# Patient Record
Sex: Male | Born: 1950 | Race: White | Hispanic: No | Marital: Married | Smoking: Former smoker
Health system: Southern US, Community
[De-identification: ages and names within clinical notes are randomized; demographics above are authoritative.]

## PROBLEM LIST (undated history)

## (undated) DIAGNOSIS — Z8489 Family history of other specified conditions: Secondary | ICD-10-CM

## (undated) DIAGNOSIS — M1990 Unspecified osteoarthritis, unspecified site: Secondary | ICD-10-CM

## (undated) HISTORY — PX: COLONOSCOPY: SHX174

---

## 1992-01-20 HISTORY — PX: KNEE CARTILAGE SURGERY: SHX688

## 1992-01-20 HISTORY — PX: ELBOW SURGERY: SHX618

## 1996-01-20 HISTORY — PX: KNEE ARTHROSCOPY: SUR90

## 2015-06-07 DIAGNOSIS — E119 Type 2 diabetes mellitus without complications: Secondary | ICD-10-CM | POA: Diagnosis not present

## 2015-06-21 DIAGNOSIS — E119 Type 2 diabetes mellitus without complications: Secondary | ICD-10-CM | POA: Diagnosis not present

## 2015-07-06 DIAGNOSIS — E119 Type 2 diabetes mellitus without complications: Secondary | ICD-10-CM | POA: Diagnosis not present

## 2015-07-06 DIAGNOSIS — Z711 Person with feared health complaint in whom no diagnosis is made: Secondary | ICD-10-CM | POA: Diagnosis not present

## 2015-07-06 DIAGNOSIS — Z794 Long term (current) use of insulin: Secondary | ICD-10-CM | POA: Diagnosis not present

## 2015-07-06 DIAGNOSIS — R69 Illness, unspecified: Secondary | ICD-10-CM | POA: Diagnosis not present

## 2015-07-31 DIAGNOSIS — E119 Type 2 diabetes mellitus without complications: Secondary | ICD-10-CM | POA: Diagnosis not present

## 2015-08-29 DIAGNOSIS — M25562 Pain in left knee: Secondary | ICD-10-CM | POA: Diagnosis not present

## 2015-08-29 DIAGNOSIS — M25561 Pain in right knee: Secondary | ICD-10-CM | POA: Diagnosis not present

## 2015-10-07 DIAGNOSIS — E785 Hyperlipidemia, unspecified: Secondary | ICD-10-CM | POA: Diagnosis not present

## 2015-10-07 DIAGNOSIS — M25562 Pain in left knee: Secondary | ICD-10-CM | POA: Diagnosis not present

## 2015-10-07 DIAGNOSIS — Z23 Encounter for immunization: Secondary | ICD-10-CM | POA: Diagnosis not present

## 2015-10-07 DIAGNOSIS — E119 Type 2 diabetes mellitus without complications: Secondary | ICD-10-CM | POA: Diagnosis not present

## 2015-11-06 DIAGNOSIS — E119 Type 2 diabetes mellitus without complications: Secondary | ICD-10-CM | POA: Diagnosis not present

## 2015-12-06 DIAGNOSIS — E119 Type 2 diabetes mellitus without complications: Secondary | ICD-10-CM | POA: Diagnosis not present

## 2015-12-06 DIAGNOSIS — I10 Essential (primary) hypertension: Secondary | ICD-10-CM | POA: Diagnosis not present

## 2015-12-06 DIAGNOSIS — L820 Inflamed seborrheic keratosis: Secondary | ICD-10-CM | POA: Diagnosis not present

## 2015-12-06 DIAGNOSIS — E1169 Type 2 diabetes mellitus with other specified complication: Secondary | ICD-10-CM | POA: Diagnosis not present

## 2015-12-17 DIAGNOSIS — I10 Essential (primary) hypertension: Secondary | ICD-10-CM | POA: Diagnosis not present

## 2015-12-17 DIAGNOSIS — E119 Type 2 diabetes mellitus without complications: Secondary | ICD-10-CM | POA: Diagnosis not present

## 2015-12-17 DIAGNOSIS — L820 Inflamed seborrheic keratosis: Secondary | ICD-10-CM | POA: Diagnosis not present

## 2015-12-17 DIAGNOSIS — E1169 Type 2 diabetes mellitus with other specified complication: Secondary | ICD-10-CM | POA: Diagnosis not present

## 2016-01-06 DIAGNOSIS — E119 Type 2 diabetes mellitus without complications: Secondary | ICD-10-CM | POA: Diagnosis not present

## 2016-01-16 DIAGNOSIS — I10 Essential (primary) hypertension: Secondary | ICD-10-CM | POA: Diagnosis not present

## 2016-01-16 DIAGNOSIS — M170 Bilateral primary osteoarthritis of knee: Secondary | ICD-10-CM | POA: Diagnosis not present

## 2016-01-16 DIAGNOSIS — Z7984 Long term (current) use of oral hypoglycemic drugs: Secondary | ICD-10-CM | POA: Diagnosis not present

## 2016-01-16 DIAGNOSIS — Z841 Family history of disorders of kidney and ureter: Secondary | ICD-10-CM | POA: Diagnosis not present

## 2016-01-16 DIAGNOSIS — Z833 Family history of diabetes mellitus: Secondary | ICD-10-CM | POA: Diagnosis not present

## 2016-01-16 DIAGNOSIS — Z8249 Family history of ischemic heart disease and other diseases of the circulatory system: Secondary | ICD-10-CM | POA: Diagnosis not present

## 2016-01-16 DIAGNOSIS — Z791 Long term (current) use of non-steroidal anti-inflammatories (NSAID): Secondary | ICD-10-CM | POA: Diagnosis not present

## 2016-01-16 DIAGNOSIS — R69 Illness, unspecified: Secondary | ICD-10-CM | POA: Diagnosis not present

## 2016-01-16 DIAGNOSIS — Z6828 Body mass index (BMI) 28.0-28.9, adult: Secondary | ICD-10-CM | POA: Diagnosis not present

## 2016-01-16 DIAGNOSIS — E119 Type 2 diabetes mellitus without complications: Secondary | ICD-10-CM | POA: Diagnosis not present

## 2016-02-04 DIAGNOSIS — E119 Type 2 diabetes mellitus without complications: Secondary | ICD-10-CM | POA: Diagnosis not present

## 2016-03-11 DIAGNOSIS — M25569 Pain in unspecified knee: Secondary | ICD-10-CM | POA: Diagnosis not present

## 2016-03-11 DIAGNOSIS — E119 Type 2 diabetes mellitus without complications: Secondary | ICD-10-CM | POA: Diagnosis not present

## 2016-03-11 DIAGNOSIS — I10 Essential (primary) hypertension: Secondary | ICD-10-CM | POA: Diagnosis not present

## 2016-03-11 DIAGNOSIS — R69 Illness, unspecified: Secondary | ICD-10-CM | POA: Diagnosis not present

## 2016-04-06 DIAGNOSIS — E119 Type 2 diabetes mellitus without complications: Secondary | ICD-10-CM | POA: Diagnosis not present

## 2016-04-06 DIAGNOSIS — R69 Illness, unspecified: Secondary | ICD-10-CM | POA: Diagnosis not present

## 2016-04-06 DIAGNOSIS — G4700 Insomnia, unspecified: Secondary | ICD-10-CM | POA: Diagnosis not present

## 2016-04-16 DIAGNOSIS — R69 Illness, unspecified: Secondary | ICD-10-CM | POA: Diagnosis not present

## 2016-04-16 DIAGNOSIS — M170 Bilateral primary osteoarthritis of knee: Secondary | ICD-10-CM | POA: Diagnosis not present

## 2016-04-16 DIAGNOSIS — I10 Essential (primary) hypertension: Secondary | ICD-10-CM | POA: Diagnosis not present

## 2016-04-16 DIAGNOSIS — E1142 Type 2 diabetes mellitus with diabetic polyneuropathy: Secondary | ICD-10-CM | POA: Diagnosis not present

## 2016-04-16 DIAGNOSIS — E1122 Type 2 diabetes mellitus with diabetic chronic kidney disease: Secondary | ICD-10-CM | POA: Diagnosis not present

## 2016-04-16 DIAGNOSIS — E1165 Type 2 diabetes mellitus with hyperglycemia: Secondary | ICD-10-CM | POA: Diagnosis not present

## 2016-04-16 DIAGNOSIS — N183 Chronic kidney disease, stage 3 (moderate): Secondary | ICD-10-CM | POA: Diagnosis not present

## 2016-05-13 DIAGNOSIS — E1122 Type 2 diabetes mellitus with diabetic chronic kidney disease: Secondary | ICD-10-CM | POA: Diagnosis not present

## 2016-05-13 DIAGNOSIS — Z6827 Body mass index (BMI) 27.0-27.9, adult: Secondary | ICD-10-CM | POA: Diagnosis not present

## 2016-05-13 DIAGNOSIS — E782 Mixed hyperlipidemia: Secondary | ICD-10-CM | POA: Diagnosis not present

## 2016-05-13 DIAGNOSIS — I10 Essential (primary) hypertension: Secondary | ICD-10-CM | POA: Diagnosis not present

## 2016-05-13 DIAGNOSIS — M25569 Pain in unspecified knee: Secondary | ICD-10-CM | POA: Diagnosis not present

## 2016-05-13 DIAGNOSIS — E1142 Type 2 diabetes mellitus with diabetic polyneuropathy: Secondary | ICD-10-CM | POA: Diagnosis not present

## 2016-08-17 DIAGNOSIS — N183 Chronic kidney disease, stage 3 (moderate): Secondary | ICD-10-CM | POA: Diagnosis not present

## 2016-08-17 DIAGNOSIS — R5383 Other fatigue: Secondary | ICD-10-CM | POA: Diagnosis not present

## 2016-08-17 DIAGNOSIS — I10 Essential (primary) hypertension: Secondary | ICD-10-CM | POA: Diagnosis not present

## 2016-08-17 DIAGNOSIS — E1122 Type 2 diabetes mellitus with diabetic chronic kidney disease: Secondary | ICD-10-CM | POA: Diagnosis not present

## 2016-08-17 DIAGNOSIS — E1142 Type 2 diabetes mellitus with diabetic polyneuropathy: Secondary | ICD-10-CM | POA: Diagnosis not present

## 2016-08-17 DIAGNOSIS — R69 Illness, unspecified: Secondary | ICD-10-CM | POA: Diagnosis not present

## 2016-08-17 DIAGNOSIS — E782 Mixed hyperlipidemia: Secondary | ICD-10-CM | POA: Diagnosis not present

## 2016-08-17 DIAGNOSIS — M170 Bilateral primary osteoarthritis of knee: Secondary | ICD-10-CM | POA: Diagnosis not present

## 2016-09-03 DIAGNOSIS — G8929 Other chronic pain: Secondary | ICD-10-CM | POA: Insufficient documentation

## 2016-09-03 DIAGNOSIS — M21162 Varus deformity, not elsewhere classified, left knee: Secondary | ICD-10-CM | POA: Diagnosis not present

## 2016-09-03 DIAGNOSIS — M170 Bilateral primary osteoarthritis of knee: Secondary | ICD-10-CM | POA: Diagnosis not present

## 2016-09-03 DIAGNOSIS — M21161 Varus deformity, not elsewhere classified, right knee: Secondary | ICD-10-CM | POA: Diagnosis not present

## 2016-09-03 DIAGNOSIS — M160 Bilateral primary osteoarthritis of hip: Secondary | ICD-10-CM | POA: Diagnosis not present

## 2016-11-20 DIAGNOSIS — R69 Illness, unspecified: Secondary | ICD-10-CM | POA: Diagnosis not present

## 2016-11-20 DIAGNOSIS — E1142 Type 2 diabetes mellitus with diabetic polyneuropathy: Secondary | ICD-10-CM | POA: Diagnosis not present

## 2016-11-20 DIAGNOSIS — M170 Bilateral primary osteoarthritis of knee: Secondary | ICD-10-CM | POA: Diagnosis not present

## 2016-11-20 DIAGNOSIS — E1122 Type 2 diabetes mellitus with diabetic chronic kidney disease: Secondary | ICD-10-CM | POA: Diagnosis not present

## 2016-11-20 DIAGNOSIS — I10 Essential (primary) hypertension: Secondary | ICD-10-CM | POA: Diagnosis not present

## 2016-11-20 DIAGNOSIS — E782 Mixed hyperlipidemia: Secondary | ICD-10-CM | POA: Diagnosis not present

## 2016-11-20 DIAGNOSIS — N183 Chronic kidney disease, stage 3 (moderate): Secondary | ICD-10-CM | POA: Diagnosis not present

## 2017-03-22 DIAGNOSIS — M1711 Unilateral primary osteoarthritis, right knee: Secondary | ICD-10-CM | POA: Diagnosis not present

## 2017-03-22 DIAGNOSIS — N183 Chronic kidney disease, stage 3 (moderate): Secondary | ICD-10-CM | POA: Diagnosis not present

## 2017-03-22 DIAGNOSIS — M170 Bilateral primary osteoarthritis of knee: Secondary | ICD-10-CM | POA: Diagnosis not present

## 2017-03-22 DIAGNOSIS — E1142 Type 2 diabetes mellitus with diabetic polyneuropathy: Secondary | ICD-10-CM | POA: Diagnosis not present

## 2017-03-22 DIAGNOSIS — E1122 Type 2 diabetes mellitus with diabetic chronic kidney disease: Secondary | ICD-10-CM | POA: Diagnosis not present

## 2017-03-22 DIAGNOSIS — E782 Mixed hyperlipidemia: Secondary | ICD-10-CM | POA: Diagnosis not present

## 2017-03-22 DIAGNOSIS — I10 Essential (primary) hypertension: Secondary | ICD-10-CM | POA: Diagnosis not present

## 2017-03-22 DIAGNOSIS — R69 Illness, unspecified: Secondary | ICD-10-CM | POA: Diagnosis not present

## 2017-03-31 DIAGNOSIS — M170 Bilateral primary osteoarthritis of knee: Secondary | ICD-10-CM | POA: Diagnosis not present

## 2017-07-21 DIAGNOSIS — E1142 Type 2 diabetes mellitus with diabetic polyneuropathy: Secondary | ICD-10-CM | POA: Diagnosis not present

## 2017-07-21 DIAGNOSIS — E1122 Type 2 diabetes mellitus with diabetic chronic kidney disease: Secondary | ICD-10-CM | POA: Diagnosis not present

## 2017-07-21 DIAGNOSIS — R69 Illness, unspecified: Secondary | ICD-10-CM | POA: Diagnosis not present

## 2017-07-21 DIAGNOSIS — E782 Mixed hyperlipidemia: Secondary | ICD-10-CM | POA: Diagnosis not present

## 2017-07-21 DIAGNOSIS — N183 Chronic kidney disease, stage 3 (moderate): Secondary | ICD-10-CM | POA: Diagnosis not present

## 2017-07-21 DIAGNOSIS — I10 Essential (primary) hypertension: Secondary | ICD-10-CM | POA: Diagnosis not present

## 2017-07-21 DIAGNOSIS — M170 Bilateral primary osteoarthritis of knee: Secondary | ICD-10-CM | POA: Diagnosis not present

## 2017-08-04 DIAGNOSIS — I10 Essential (primary) hypertension: Secondary | ICD-10-CM | POA: Diagnosis not present

## 2017-11-12 DIAGNOSIS — E119 Type 2 diabetes mellitus without complications: Secondary | ICD-10-CM | POA: Diagnosis not present

## 2017-11-12 LAB — HM DIABETES EYE EXAM

## 2018-05-02 DIAGNOSIS — E1122 Type 2 diabetes mellitus with diabetic chronic kidney disease: Secondary | ICD-10-CM | POA: Diagnosis not present

## 2018-05-02 DIAGNOSIS — E782 Mixed hyperlipidemia: Secondary | ICD-10-CM | POA: Diagnosis not present

## 2018-05-02 DIAGNOSIS — I10 Essential (primary) hypertension: Secondary | ICD-10-CM | POA: Diagnosis not present

## 2018-05-02 DIAGNOSIS — Z6828 Body mass index (BMI) 28.0-28.9, adult: Secondary | ICD-10-CM | POA: Diagnosis not present

## 2018-05-02 DIAGNOSIS — R69 Illness, unspecified: Secondary | ICD-10-CM | POA: Diagnosis not present

## 2018-07-13 DIAGNOSIS — Z1159 Encounter for screening for other viral diseases: Secondary | ICD-10-CM | POA: Diagnosis not present

## 2018-07-13 DIAGNOSIS — Z0000 Encounter for general adult medical examination without abnormal findings: Secondary | ICD-10-CM | POA: Diagnosis not present

## 2018-09-01 DIAGNOSIS — E1142 Type 2 diabetes mellitus with diabetic polyneuropathy: Secondary | ICD-10-CM | POA: Diagnosis not present

## 2018-09-01 DIAGNOSIS — N183 Chronic kidney disease, stage 3 (moderate): Secondary | ICD-10-CM | POA: Diagnosis not present

## 2018-09-01 DIAGNOSIS — I10 Essential (primary) hypertension: Secondary | ICD-10-CM | POA: Diagnosis not present

## 2018-09-01 DIAGNOSIS — Z6828 Body mass index (BMI) 28.0-28.9, adult: Secondary | ICD-10-CM | POA: Diagnosis not present

## 2018-09-01 DIAGNOSIS — E782 Mixed hyperlipidemia: Secondary | ICD-10-CM | POA: Diagnosis not present

## 2018-10-18 DIAGNOSIS — M75101 Unspecified rotator cuff tear or rupture of right shoulder, not specified as traumatic: Secondary | ICD-10-CM | POA: Diagnosis not present

## 2018-10-18 DIAGNOSIS — M170 Bilateral primary osteoarthritis of knee: Secondary | ICD-10-CM | POA: Diagnosis not present

## 2018-10-18 DIAGNOSIS — M75102 Unspecified rotator cuff tear or rupture of left shoulder, not specified as traumatic: Secondary | ICD-10-CM | POA: Diagnosis not present

## 2018-10-18 DIAGNOSIS — E1142 Type 2 diabetes mellitus with diabetic polyneuropathy: Secondary | ICD-10-CM | POA: Diagnosis not present

## 2018-10-26 DIAGNOSIS — M1712 Unilateral primary osteoarthritis, left knee: Secondary | ICD-10-CM | POA: Diagnosis not present

## 2018-10-26 DIAGNOSIS — Z1159 Encounter for screening for other viral diseases: Secondary | ICD-10-CM | POA: Diagnosis not present

## 2019-01-11 DIAGNOSIS — E782 Mixed hyperlipidemia: Secondary | ICD-10-CM | POA: Diagnosis not present

## 2019-01-11 DIAGNOSIS — E1142 Type 2 diabetes mellitus with diabetic polyneuropathy: Secondary | ICD-10-CM | POA: Diagnosis not present

## 2019-01-11 DIAGNOSIS — E1122 Type 2 diabetes mellitus with diabetic chronic kidney disease: Secondary | ICD-10-CM | POA: Diagnosis not present

## 2019-01-11 DIAGNOSIS — Z1159 Encounter for screening for other viral diseases: Secondary | ICD-10-CM | POA: Diagnosis not present

## 2019-01-11 DIAGNOSIS — I10 Essential (primary) hypertension: Secondary | ICD-10-CM | POA: Diagnosis not present

## 2019-01-11 DIAGNOSIS — Z6828 Body mass index (BMI) 28.0-28.9, adult: Secondary | ICD-10-CM | POA: Diagnosis not present

## 2019-01-11 DIAGNOSIS — N1831 Chronic kidney disease, stage 3a: Secondary | ICD-10-CM | POA: Diagnosis not present

## 2019-01-11 DIAGNOSIS — R69 Illness, unspecified: Secondary | ICD-10-CM | POA: Diagnosis not present

## 2019-01-16 DIAGNOSIS — E782 Mixed hyperlipidemia: Secondary | ICD-10-CM | POA: Diagnosis not present

## 2019-01-16 DIAGNOSIS — I10 Essential (primary) hypertension: Secondary | ICD-10-CM | POA: Diagnosis not present

## 2019-01-16 DIAGNOSIS — Z23 Encounter for immunization: Secondary | ICD-10-CM | POA: Diagnosis not present

## 2019-01-16 DIAGNOSIS — E1122 Type 2 diabetes mellitus with diabetic chronic kidney disease: Secondary | ICD-10-CM | POA: Diagnosis not present

## 2019-01-20 DIAGNOSIS — R69 Illness, unspecified: Secondary | ICD-10-CM | POA: Diagnosis not present

## 2019-04-12 ENCOUNTER — Other Ambulatory Visit: Payer: Self-pay | Admitting: Legal Medicine

## 2019-04-26 ENCOUNTER — Other Ambulatory Visit: Payer: Self-pay | Admitting: Legal Medicine

## 2019-04-27 ENCOUNTER — Other Ambulatory Visit: Payer: Self-pay

## 2019-04-27 ENCOUNTER — Ambulatory Visit (INDEPENDENT_AMBULATORY_CARE_PROVIDER_SITE_OTHER): Payer: Medicare HMO | Admitting: Legal Medicine

## 2019-04-27 ENCOUNTER — Other Ambulatory Visit: Payer: Self-pay | Admitting: Legal Medicine

## 2019-04-27 ENCOUNTER — Encounter: Payer: Self-pay | Admitting: Legal Medicine

## 2019-04-27 VITALS — BP 160/90 | HR 86 | Temp 96.8°F | Resp 17 | Ht 70.0 in | Wt 197.0 lb

## 2019-04-27 DIAGNOSIS — E1142 Type 2 diabetes mellitus with diabetic polyneuropathy: Secondary | ICD-10-CM

## 2019-04-27 DIAGNOSIS — E782 Mixed hyperlipidemia: Secondary | ICD-10-CM

## 2019-04-27 DIAGNOSIS — N183 Chronic kidney disease, stage 3 unspecified: Secondary | ICD-10-CM

## 2019-04-27 DIAGNOSIS — E1122 Type 2 diabetes mellitus with diabetic chronic kidney disease: Secondary | ICD-10-CM | POA: Insufficient documentation

## 2019-04-27 DIAGNOSIS — M1711 Unilateral primary osteoarthritis, right knee: Secondary | ICD-10-CM | POA: Diagnosis not present

## 2019-04-27 DIAGNOSIS — N1831 Chronic kidney disease, stage 3a: Secondary | ICD-10-CM | POA: Insufficient documentation

## 2019-04-27 DIAGNOSIS — I10 Essential (primary) hypertension: Secondary | ICD-10-CM

## 2019-04-27 HISTORY — DX: Essential (primary) hypertension: I10

## 2019-04-27 HISTORY — DX: Chronic kidney disease, stage 3 unspecified: N18.30

## 2019-04-27 HISTORY — DX: Type 2 diabetes mellitus with diabetic polyneuropathy: E11.42

## 2019-04-27 HISTORY — DX: Chronic kidney disease, stage 3 unspecified: E11.22

## 2019-04-27 HISTORY — DX: Mixed hyperlipidemia: E78.2

## 2019-04-27 HISTORY — DX: Chronic kidney disease, stage 3a: N18.31

## 2019-04-27 MED ORDER — TRIAMCINOLONE ACETONIDE 40 MG/ML IJ SUSP
80.0000 mg | Freq: Once | INTRAMUSCULAR | Status: AC
  Administered 2019-04-27: 80 mg via INTRA_ARTICULAR

## 2019-04-27 NOTE — Progress Notes (Signed)
Acute Office Visit  Subjective:    Patient ID: Clayton James, male    DOB: 1950-02-24, 69 y.o.   MRN: [id number]  Chief Complaint  Patient presents with  . Osteoarthritis    Right knee pain since years ago    HPI Patient is in today for right knee osteoarthritis.  Severe pain  2 weeks ago.  He had injections in the past that helped.  He is reluctant to get TKA but we discussed this is his future.    No falls.  Past Medical History:  Diagnosis Date  . Benign essential hypertension 04/27/2019  . Chronic kidney disease, stage 3a 04/27/2019  . Mixed hyperlipidemia 04/27/2019  . Type 2 diabetes mellitus with diabetic polyneuropathy ([place]) 04/27/2019  . Type 2 diabetes mellitus with stage 3 chronic kidney disease ([place]) 04/27/2019    History reviewed. No pertinent surgical history.  History reviewed. No pertinent family history.  Social History   Socioeconomic History  . Marital status: Married    Spouse name: Not on file  . Number of children: Not on file  . Years of education: Not on file  . Highest education level: Not on file  Occupational History  . Not on file  Tobacco Use  . Smoking status: Former Smoker    Years: 8.00    Types: Cigarettes    Quit date: 2000    Years since quitting: 21.2  . Smokeless tobacco: Current User    Types: Chew  Substance and Sexual Activity  . Alcohol use: Not Currently  . Drug use: Never  . Sexual activity: Yes  Other Topics Concern  . Not on file  Social History Narrative  . Not on file   Social Determinants of Health   Financial Resource Strain:   . Difficulty of Paying Living Expenses:   Food Insecurity:   . Worried About Charity fundraiser in the Last Year:   . Arboriculturist in the Last Year:   Transportation Needs:   . Film/video editor (Medical):   Marland Kitchen Lack of Transportation (Non-Medical):   Physical Activity:   . Days of Exercise per Week:   . Minutes of Exercise per Session:   Stress:   . Feeling of Stress :     Social Connections:   . Frequency of Communication with Friends and Family:   . Frequency of Social Gatherings with Friends and Family:   . Attends Religious Services:   . Active Member of Clubs or Organizations:   . Attends Archivist Meetings:   Marland Kitchen Marital Status:   Intimate Partner Violence:   . Fear of Current or Ex-Partner:   . Emotionally Abused:   Marland Kitchen Physically Abused:   . Sexually Abused:     Outpatient Medications Prior to Visit  Medication Sig Dispense Refill  . ALPRAZolam (XANAX) 0.5 MG tablet Take 1 tablet by mouth twice daily as needed 60 tablet 2  . amLODipine-benazepril (LOTREL) 10-20 MG capsule Take 1 capsule by mouth once daily 90 capsule 2  . atorvastatin (LIPITOR) 40 MG tablet Take by mouth.    . BD PEN NEEDLE MICRO U/F 32G X 6 MM MISC daily. use as directed    . Blood Glucose Monitoring Suppl (ONE TOUCH ULTRA 2) w/Device KIT USE TO CHECK GLUCOSE THREE TIMES DAILY    . dapagliflozin propanediol (FARXIGA) 10 MG TABS tablet Take by mouth.    . Diclofenac Sodium CR 100 MG 24 hr tablet Take 100 mg by mouth  daily.    . insulin degludec (TRESIBA) 100 UNIT/ML FlexTouch Pen Inject into the skin.    . metFORMIN (GLUCOPHAGE) 1000 MG tablet Take 1 tablet by mouth twice daily 180 tablet 2   No facility-administered medications prior to visit.    Allergies  Allergen Reactions  . Sulfamethoxazole Rash    Review of Systems  Constitutional: Negative.   HENT: Negative.   Eyes: Negative.   Respiratory: Negative.   Cardiovascular: Negative.   Gastrointestinal: Negative.   Genitourinary: Negative.   Musculoskeletal: Positive for arthralgias and joint swelling.  Psychiatric/Behavioral: Negative.        Objective:    Physical Exam Vitals reviewed.  Constitutional:      Appearance: Normal appearance.  Cardiovascular:     Rate and Rhythm: Normal rate and regular rhythm.     Pulses: Normal pulses.     Heart sounds: Normal heart sounds.  Pulmonary:      Effort: Pulmonary effort is normal.     Breath sounds: Normal breath sounds.  Musculoskeletal:     Cervical back: Normal range of motion and neck supple.     Right knee: Swelling, bony tenderness and crepitus present.     Left knee: Swelling, bony tenderness and crepitus present.       Legs:     Comments: Pain right knee.  ROM fu.  Positive crepitation.  Negative McMurray and negative Lachman  Neurological:     General: No focal deficit present.     Mental Status: He is alert and oriented to person, place, and time.     BP (!) 160/90 (BP Location: Left Arm, Patient Position: Sitting)   Pulse 86   Temp (!) 96.8 F (36 C) (Temporal)   Resp 17   Ht 5' 10"  (1.778 m)   Wt 197 lb (89.4 kg)   BMI 28.27 kg/m  Wt Readings from Last 3 Encounters:  04/27/19 197 lb (89.4 kg)    Health Maintenance Due  Topic Date Due  . HEMOGLOBIN A1C  Never done  . Hepatitis C Screening  Never done  . FOOT EXAM  Never done  . OPHTHALMOLOGY EXAM  Never done  . TETANUS/TDAP  Never done  . COLONOSCOPY  Never done  . PNA vac Low Risk Adult (1 of 2 - PCV13) Never done    There are no preventive care reminders to display for this patient.   No results found for: TSH No results found for: WBC, HGB, HCT, MCV, PLT No results found for: NA, K, CHLORIDE, CO2, GLUCOSE, BUN, CREATININE, BILITOT, ALKPHOS, AST, ALT, PROT, ALBUMIN, CALCIUM, ANIONGAP, EGFR, GFR No results found for: CHOL No results found for: HDL No results found for: LDLCALC No results found for: TRIG No results found for: CHOLHDL No results found for: HGBA1C     Assessment & Plan:   Problem List Items Addressed This Visit      Musculoskeletal and Integument   Osteoarthritis of right knee    He is having severe pain in right knee.  He is considering getting TKA but wishes injection for now.      Relevant Medications   Diclofenac Sodium CR 100 MG 24 hr tablet    Other Visit Diagnoses    Unilateral primary osteoarthritis, right  knee    -  Primary   Relevant Medications   Diclofenac Sodium CR 100 MG 24 hr tablet   triamcinolone acetonide (KENALOG-40) injection 80 mg (Completed)     Return in about 1 week (around 05/04/2019)  for for left knee.  Meds ordered this encounter  Medications  . triamcinolone acetonide (KENALOG-40) injection 80 mg     Reinaldo Meeker, MD

## 2019-04-27 NOTE — Assessment & Plan Note (Signed)
He is having severe pain in right knee.  He is considering getting TKA but wishes injection for now.

## 2019-05-04 ENCOUNTER — Encounter: Payer: Self-pay | Admitting: Legal Medicine

## 2019-05-04 ENCOUNTER — Other Ambulatory Visit: Payer: Self-pay

## 2019-05-04 ENCOUNTER — Ambulatory Visit (INDEPENDENT_AMBULATORY_CARE_PROVIDER_SITE_OTHER): Payer: Medicare HMO | Admitting: Legal Medicine

## 2019-05-04 DIAGNOSIS — M1712 Unilateral primary osteoarthritis, left knee: Secondary | ICD-10-CM | POA: Insufficient documentation

## 2019-05-04 DIAGNOSIS — I10 Essential (primary) hypertension: Secondary | ICD-10-CM

## 2019-05-04 MED ORDER — TRIAMCINOLONE ACETONIDE 40 MG/ML IJ SUSP: 80.0000 mg | Freq: Once | INTRAMUSCULAR | Status: DC

## 2019-05-04 MED ORDER — BD PEN NEEDLE MICRO U/F 32G X 6 MM MISC: 1.0000 | Freq: Every day | 4 refills | Status: DC

## 2019-05-04 NOTE — Assessment & Plan Note (Signed)
Left knee injected for osteoarthritis see procedure.

## 2019-05-04 NOTE — Assessment & Plan Note (Signed)
Check BP at home for one week and call us results, if remains elevated, then we discussed BP medicine changes.  Today he is stressed and in a hurry.

## 2019-05-04 NOTE — Progress Notes (Signed)
Established Patient Office Visit  Subjective:  Patient ID: Clayton James, male    DOB: 14-Mar-1950  Age: 69 y.o. MRN: [id number]  CC:  Chief Complaint  Patient presents with  . Knee Pain    Left knee    HPI Clayton James presents for injection left knee due to primary osteoarthritis.  Past Medical History:  Diagnosis Date  . Benign essential hypertension 04/27/2019  . Chronic kidney disease, stage 3a 04/27/2019  . Mixed hyperlipidemia 04/27/2019  . Type 2 diabetes mellitus with diabetic polyneuropathy ([place]) 04/27/2019  . Type 2 diabetes mellitus with stage 3 chronic kidney disease ([place]) 04/27/2019    History reviewed. No pertinent surgical history.  History reviewed. No pertinent family history.  Social History   Socioeconomic History  . Marital status: Married    Spouse name: Not on file  . Number of children: Not on file  . Years of education: Not on file  . Highest education level: Not on file  Occupational History  . Not on file  Tobacco Use  . Smoking status: Former Smoker    Years: 8.00    Types: Cigarettes    Quit date: 2000    Years since quitting: 21.3  . Smokeless tobacco: Current User    Types: Chew  Substance and Sexual Activity  . Alcohol use: Not Currently  . Drug use: Never  . Sexual activity: Yes  Other Topics Concern  . Not on file  Social History Narrative  . Not on file   Social Determinants of Health   Financial Resource Strain:   . Difficulty of Paying Living Expenses:   Food Insecurity:   . Worried About Charity fundraiser in the Last Year:   . Arboriculturist in the Last Year:   Transportation Needs:   . Film/video editor (Medical):   Marland Kitchen Lack of Transportation (Non-Medical):   Physical Activity:   . Days of Exercise per Week:   . Minutes of Exercise per Session:   Stress:   . Feeling of Stress :   Social Connections:   . Frequency of Communication with Friends and Family:   . Frequency of Social Gatherings with Friends and  Family:   . Attends Religious Services:   . Active Member of Clubs or Organizations:   . Attends Archivist Meetings:   Marland Kitchen Marital Status:   Intimate Partner Violence:   . Fear of Current or Ex-Partner:   . Emotionally Abused:   Marland Kitchen Physically Abused:   . Sexually Abused:     Outpatient Medications Prior to Visit  Medication Sig Dispense Refill  . ALPRAZolam (XANAX) 0.5 MG tablet Take 1 tablet by mouth twice daily as needed 60 tablet 2  . amLODipine-benazepril (LOTREL) 10-20 MG capsule Take 1 capsule by mouth once daily 90 capsule 2  . atorvastatin (LIPITOR) 40 MG tablet Take by mouth.    . Blood Glucose Monitoring Suppl (ONE TOUCH ULTRA 2) w/Device KIT USE TO CHECK GLUCOSE THREE TIMES DAILY    . dapagliflozin propanediol (FARXIGA) 10 MG TABS tablet Take by mouth.    . Diclofenac Sodium CR 100 MG 24 hr tablet Take 100 mg by mouth daily.    . insulin degludec (TRESIBA) 100 UNIT/ML FlexTouch Pen Inject into the skin.    . metFORMIN (GLUCOPHAGE) 1000 MG tablet Take 1 tablet by mouth twice daily 180 tablet 2  . BD PEN NEEDLE MICRO U/F 32G X 6 MM MISC daily. use as directed  No facility-administered medications prior to visit.    Allergies  Allergen Reactions  . Sulfamethoxazole Rash    ROS Review of Systems  Constitutional: Negative.   HENT: Negative.   Eyes: Negative.   Respiratory: Negative.   Cardiovascular: Negative.   Gastrointestinal: Negative.   Endocrine: Negative.   Musculoskeletal: Positive for arthralgias.  Skin: Negative.   Neurological: Negative.   Hematological: Negative.   Psychiatric/Behavioral: Negative.       Objective:    Physical Exam  Musculoskeletal:     Left knee: Tenderness present.  Vitals reviewed.   BP (!) 160/84   Pulse 71   Temp 97.7 F (36.5 C)   Resp 16   Ht '5\' 10"'$  (1.778 m)   Wt 194 lb 9.6 oz (88.3 kg)   SpO2 95%   BMI 27.92 kg/m  Wt Readings from Last 3 Encounters:  05/04/19 194 lb 9.6 oz (88.3 kg)  04/27/19  197 lb (89.4 kg)     Health Maintenance Due  Topic Date Due  . HEMOGLOBIN A1C  Never done  . Hepatitis C Screening  Never done  . FOOT EXAM  Never done  . OPHTHALMOLOGY EXAM  Never done  . TETANUS/TDAP  Never done  . COLONOSCOPY  Never done  . PNA vac Low Risk Adult (1 of 2 - PCV13) Never done    There are no preventive care reminders to display for this patient.  No results found for: TSH No results found for: WBC, HGB, HCT, MCV, PLT No results found for: NA, K, CHLORIDE, CO2, GLUCOSE, BUN, CREATININE, BILITOT, ALKPHOS, AST, ALT, PROT, ALBUMIN, CALCIUM, ANIONGAP, EGFR, GFR No results found for: CHOL No results found for: HDL No results found for: LDLCALC No results found for: TRIG No results found for: CHOLHDL No results found for: HGBA1C    Assessment & Plan:   Problem List Items Addressed This Visit      Cardiovascular and Mediastinum   Benign essential hypertension    Check BP at home for one week and call us results, if remains elevated, then we discussed BP medicine changes.  Today he is stressed and in a hurry.        Musculoskeletal and Integument   Osteoarthritis of left knee    Left knee injected for osteoarthritis see procedure.      Relevant Medications   triamcinolone acetonide (KENALOG-40) injection 80 mg (Start on 05/04/2019  8:45 AM)    After consent was obtained, using sterile technique the left knee was prepped and plain Lidocaine 1% was used as local anesthetic. The joint was entered and 0 ml's of clear colored fluid was withdrawn.  Steroid 80 mg and 3 ml plain Lidocaine was then injected and the needle withdrawn.  The procedure was well tolerated.  The patient is asked to continue to rest the joint for a few more days before resuming regular activities.  It may be more painful for the first 1-2 days.  Watch for fever, or increased swelling or persistent pain in the joint. Call or return to clinic prn if such symptoms occur or there is failure to  improve as anticipated.  Meds ordered this encounter  Medications  . BD PEN NEEDLE MICRO U/F 32G X 6 MM MISC    Sig: Inject 1 each into the skin daily. use as directed    Dispense:  100 each    Refill:  4  . triamcinolone acetonide (KENALOG-40) injection 80 mg    Follow-up: Return if symptoms worsen  or fail to improve.    Reinaldo Meeker, MD

## 2019-05-15 ENCOUNTER — Other Ambulatory Visit: Payer: Self-pay

## 2019-05-15 MED ORDER — INSULIN DEGLUDEC 100 UNIT/ML ~~LOC~~ SOPN: 30.0000 [IU] | PEN_INJECTOR | Freq: Every day | SUBCUTANEOUS | 1 refills | Status: DC

## 2019-05-17 ENCOUNTER — Encounter: Payer: Self-pay | Admitting: Legal Medicine

## 2019-05-17 ENCOUNTER — Ambulatory Visit (INDEPENDENT_AMBULATORY_CARE_PROVIDER_SITE_OTHER): Payer: Medicare HMO | Admitting: Legal Medicine

## 2019-05-17 ENCOUNTER — Other Ambulatory Visit: Payer: Self-pay

## 2019-05-17 ENCOUNTER — Telehealth: Payer: Self-pay

## 2019-05-17 ENCOUNTER — Ambulatory Visit: Payer: Self-pay | Admitting: Family Medicine

## 2019-05-17 VITALS — BP 130/82 | HR 87 | Temp 97.5°F | Wt 195.2 lb

## 2019-05-17 DIAGNOSIS — E1121 Type 2 diabetes mellitus with diabetic nephropathy: Secondary | ICD-10-CM

## 2019-05-17 DIAGNOSIS — N1831 Chronic kidney disease, stage 3a: Secondary | ICD-10-CM | POA: Diagnosis not present

## 2019-05-17 DIAGNOSIS — I10 Essential (primary) hypertension: Secondary | ICD-10-CM | POA: Diagnosis not present

## 2019-05-17 DIAGNOSIS — E1122 Type 2 diabetes mellitus with diabetic chronic kidney disease: Secondary | ICD-10-CM

## 2019-05-17 DIAGNOSIS — E782 Mixed hyperlipidemia: Secondary | ICD-10-CM

## 2019-05-17 DIAGNOSIS — E1142 Type 2 diabetes mellitus with diabetic polyneuropathy: Secondary | ICD-10-CM | POA: Diagnosis not present

## 2019-05-17 LAB — POCT UA - MICROALBUMIN: Microalbumin Ur, POC: 80 mg/L

## 2019-05-17 NOTE — Progress Notes (Signed)
Established Patient Office Visit  Subjective:  Patient ID: Clayton James, male    DOB: [birth year]  Age: 69 y.o. MRN: [id number]  CC:  Chief Complaint  Patient presents with  . Diabetes  . Hypertension  . Hyperlipidemia    HPI Clayton James presents for chronic visit  Patient present with type 2 diabetes.  Specifically, this is type 2, insulin dependent requiring diabetes, complicated by polyneuropathy, renal disease.  Compliance with treatment has been good; patient take medicines as directed, maintains diet and exercise regimen, follows up as directed, and is keeping glucose diary.  Date of  diagnosis 2010.  Depression screen has been performed.Tobacco screen nonsmoker. Current medicines for diabetes metformin, tresiba 30 units, farixiga.  Patient is on renal stopped benazapri protection and atorvastatin for cholesterol control.  Patient performs foot exams daily and last ophthalmologic exam was last year.  Patient presents for follow up of hypertension.  Patient tolerating lotrel well with side effects.  Patient was diagnosed with hypertension 2010 so has been treated for hypertension for 10 years.Patient is working on maintaining diet and exercise regimen and follows up as directed. Complication include none.  Patient presents with hyperlipidemia.  Compliance with treatment has been good; patient takes medicines as directed, maintains low cholesterol diet, follows up as directed, and maintains exercise regimen.  Patient is using atorvastatin without problems.  Past Medical History:  Diagnosis Date  . Benign essential hypertension 04/27/2019  . Chronic kidney disease, stage 3a 04/27/2019  . Mixed hyperlipidemia 04/27/2019  . Type 2 diabetes mellitus with diabetic polyneuropathy ([place]) 04/27/2019  . Type 2 diabetes mellitus with stage 3 chronic kidney disease ([place]) 04/27/2019    History reviewed. No pertinent surgical history.  History reviewed. No pertinent family history.  Social History    Socioeconomic History  . Marital status: Married    Spouse name: Not on file  . Number of children: Not on file  . Years of education: Not on file  . Highest education level: Not on file  Occupational History  . Not on file  Tobacco Use  . Smoking status: Former Smoker    Years: 8.00    Types: Cigarettes    Quit date: 2000    Years since quitting: 21.3  . Smokeless tobacco: Current User    Types: Chew  Substance and Sexual Activity  . Alcohol use: Not Currently  . Drug use: Never  . Sexual activity: Yes  Other Topics Concern  . Not on file  Social History Narrative  . Not on file   Social Determinants of Health   Financial Resource Strain:   . Difficulty of Paying Living Expenses:   Food Insecurity:   . Worried About Charity fundraiser in the Last Year:   . Arboriculturist in the Last Year:   Transportation Needs:   . Film/video editor (Medical):   Marland Kitchen Lack of Transportation (Non-Medical):   Physical Activity:   . Days of Exercise per Week:   . Minutes of Exercise per Session:   Stress:   . Feeling of Stress :   Social Connections:   . Frequency of Communication with Friends and Family:   . Frequency of Social Gatherings with Friends and Family:   . Attends Religious Services:   . Active Member of Clubs or Organizations:   . Attends Archivist Meetings:   Marland Kitchen Marital Status:   Intimate Partner Violence:   . Fear of Current or Ex-Partner:   . Emotionally  Abused:   Marland Kitchen Physically Abused:   . Sexually Abused:     Outpatient Medications Prior to Visit  Medication Sig Dispense Refill  . ALPRAZolam (XANAX) 0.5 MG tablet Take 1 tablet by mouth twice daily as needed 60 tablet 2  . amLODipine-benazepril (LOTREL) 10-20 MG capsule Take 1 capsule by mouth once daily 90 capsule 2  . atorvastatin (LIPITOR) 40 MG tablet Take by mouth.    . BD PEN NEEDLE MICRO U/F 32G X 6 MM MISC Inject 1 each into the skin daily. use as directed 100 each 4  . Blood Glucose  Monitoring Suppl (ONE TOUCH ULTRA 2) w/Device KIT USE TO CHECK GLUCOSE THREE TIMES DAILY    . dapagliflozin propanediol (FARXIGA) 10 MG TABS tablet Take by mouth.    . Diclofenac Sodium CR 100 MG 24 hr tablet Take 100 mg by mouth daily.    . insulin degludec (TRESIBA) 100 UNIT/ML FlexTouch Pen Inject 0.3 mLs (30 Units total) into the skin daily. 18 mL 1  . metFORMIN (GLUCOPHAGE) 1000 MG tablet Take 1 tablet by mouth twice daily 180 tablet 2   Facility-Administered Medications Prior to Visit  Medication Dose Route Frequency Provider Last Rate Last Admin  . triamcinolone acetonide (KENALOG-40) injection 80 mg  80 mg Intra-articular Once Lillard Anes, MD        Allergies  Allergen Reactions  . Sulfamethoxazole Rash    ROS Review of Systems  Constitutional: Negative.   HENT: Negative.   Eyes: Negative.   Respiratory: Negative.   Cardiovascular: Negative.   Gastrointestinal: Negative.   Endocrine: Negative.   Genitourinary: Negative.   Musculoskeletal: Negative.   Skin: Negative.   Neurological: Negative.   Psychiatric/Behavioral: Negative.       Objective:    Physical Exam  Constitutional: He is oriented to person, place, and time. He appears well-developed and well-nourished.  HENT:  Head: Normocephalic and atraumatic.  Right Ear: External ear normal.  Left Ear: External ear normal.  Nose: Nose normal.  Mouth/Throat: Oropharynx is clear and moist.  Eyes: Pupils are equal, round, and reactive to light. Conjunctivae and EOM are normal.  Cardiovascular: Normal rate, regular rhythm, normal heart sounds and intact distal pulses.  Pulmonary/Chest: Effort normal and breath sounds normal.  Abdominal: Soft. Bowel sounds are normal.  Musculoskeletal:        General: Normal range of motion.     Cervical back: Normal range of motion and neck supple.  Neurological: He is alert and oriented to person, place, and time. He has normal reflexes.  Skin: Skin is warm and dry.    Psychiatric: He has a normal mood and affect. His behavior is normal. Thought content normal.  Vitals reviewed.   BP 130/82   Pulse 87   Temp (!) 97.5 F (36.4 C)   Wt 195 lb 3.2 oz (88.5 kg)   SpO2 96%   BMI 28.01 kg/m  Wt Readings from Last 3 Encounters:  05/17/19 195 lb 3.2 oz (88.5 kg)  05/04/19 194 lb 9.6 oz (88.3 kg)  04/27/19 197 lb (89.4 kg)     Health Maintenance Due  Topic Date Due  . HEMOGLOBIN A1C  Never done  . Hepatitis C Screening  Never done  . OPHTHALMOLOGY EXAM  Never done  . COVID-19 Vaccine (1) Never done  . TETANUS/TDAP  Never done  . COLONOSCOPY  Never done  . PNA vac Low Risk Adult (1 of 2 - PCV13) Never done    There are no  preventive care reminders to display for this patient.  No results found for: TSH No results found for: WBC, HGB, HCT, MCV, PLT No results found for: NA, K, CHLORIDE, CO2, GLUCOSE, BUN, CREATININE, BILITOT, ALKPHOS, AST, ALT, PROT, ALBUMIN, CALCIUM, ANIONGAP, EGFR, GFR No results found for: CHOL No results found for: HDL No results found for: LDLCALC No results found for: TRIG No results found for: CHOLHDL No results found for: HGBA1C     Diabetic Foot Exam - Simple   Simple Foot Form Diabetic Foot exam was performed with the following findings: Yes 05/17/2019  8:20 AM  Visual Inspection No deformities, no ulcerations, no other skin breakdown bilaterally: Yes Sensation Testing See comments: Yes Pulse Check Posterior Tibialis and Dorsalis pulse intact bilaterally: Yes Comments Slow pulses and capillary refills in feet, decreased sensation    Assessment & Plan:   Problem List Items Addressed This Visit      Cardiovascular and Mediastinum   Benign essential hypertension    An individual hypertension care plan was established and reinforced today.  The patient's status was assessed using clinical findings on exam and labs or diagnostic tests. The patient's success at meeting treatment goals on disease specific  evidence-based guidelines and found to be well controlled. SELF MANAGEMENT: The patient and I together assessed ways to personally work towards obtaining the recommended goals. RECOMMENDATIONS: avoid decongestants found in common cold remedies, decrease consumption of alcohol, perform routine monitoring of BP with home BP cuff, exercise, reduction of dietary salt, take medicines as prescribed, try not to miss doses and quit smoking.  Regular exercise and maintaining a healthy weight is needed.  Stress reduction may help. A CLINICAL SUMMARY including written plan identify barriers to care unique to individual due to social or financial issues.  We attempt to mutually creat solutions for individual and family understanding.      Relevant Orders   CBC with Differential/Platelet   Comprehensive metabolic panel     Endocrine   Type 2 diabetes mellitus with diabetic polyneuropathy ([place]) - Primary    An individual care plan for diabetes was established and reinforced today.  The patient's status was assessed using clinical findings on exam, labs and diagnostic testing. Patient success at meeting goals based on disease specific evidence-based guidelines and found to be good controlled. Medications were assessed and patient's understanding of the medical issues , including barriers were assessed. Recommend adherence to a diabetic diet, a graduated exercise program, HgbA1c level is checked quarterly, and urine microalbumin performed yearly .  Annual mono-filament sensation testing performed. Lower blood pressure and control hyperlipidemia is important. Get annual eye exams and annual flu shots and smoking cessation discussed.  Self management goals were discussed.      Relevant Orders   Hemoglobin A1c   POCT UA - Microalbumin (Completed)   Type 2 diabetes mellitus with stage 3 chronic kidney disease ([place])    An individual care plan for diabetes was established and reinforced today.  The patient's status  was assessed using clinical findings on exam, labs and diagnostic testing. Patient success at meeting goals based on disease specific evidence-based guidelines and found to be good controlled. Medications were assessed and patient's understanding of the medical issues , including barriers were assessed. Recommend adherence to a diabetic diet, a graduated exercise program, HgbA1c level is checked quarterly, and urine microalbumin performed yearly .  Annual mono-filament sensation testing performed. Lower blood pressure and control hyperlipidemia is important. Get annual eye exams and annual flu shots  and smoking cessation discussed.  Self management goals were discussed.        Genitourinary   Chronic kidney disease, stage 3a    AN INDIVIDUAL CARE PLAN was established and reinforced today.  The patient's status was assessed using clinical findings on exam, labs, and other diagnostic testing. Patient's success at meeting treatment goals based on disease specific evidence-bassed guidelines and found to be in good control. RECOMMENDATIONS include maintaining present medicines and treatment.        Other   Mixed hyperlipidemia    AN INDIVIDUAL CARE PLAN for hyperlipidemia/ cholesterol was established and reinforced today.  The patient's status was assessed using clinical findings on exam, lab and other diagnostic tests. The patient's disease status was assessed based on evidence-based guidelines and found to be fair controlled. MEDICATIONS were reviewed. SELF MANAGEMENT GOALS have been discussed and patient's success at attaining the goal of low cholesterol was assessed. RECOMMENDATION given include regular exercise 3 days a week and low cholesterol/low fat diet. CLINICAL SUMMARY including written plan to identify barriers unique to the patient due to social or economic  reasons was discussed.      Relevant Orders   Lipid panel      No orders of the defined types were placed in this  encounter.   Follow-up: Return in about 4 months (around 09/16/2019) for fasting.    Reinaldo Meeker, MD

## 2019-05-17 NOTE — Assessment & Plan Note (Signed)
An individual care plan for diabetes was established and reinforced today.  The patient's status was assessed using clinical findings on exam, labs and diagnostic testing. Patient success at meeting goals based on disease specific evidence-based guidelines and found to be good controlled. Medications were assessed and patient's understanding of the medical issues , including barriers were assessed. Recommend adherence to a diabetic diet, a graduated exercise program, HgbA1c level is checked quarterly, and urine microalbumin performed yearly .  Annual mono-filament sensation testing performed. Lower blood pressure and control hyperlipidemia is important. Get annual eye exams and annual flu shots and smoking cessation discussed.  Self management goals were discussed. 

## 2019-05-17 NOTE — Telephone Encounter (Signed)
LM for patient to return call.  He is due for Diabetic Eye Exam, would like to invite him to come to the office on 05/25/19 for a retinopathy screening.

## 2019-05-17 NOTE — Assessment & Plan Note (Signed)
AN INDIVIDUAL CARE PLAN was established and reinforced today.  The patient's status was assessed using clinical findings on exam, labs, and other diagnostic testing. Patient's success at meeting treatment goals based on disease specific evidence-bassed guidelines and found to be in good control. RECOMMENDATIONS include maintaining present medicines and treatment. 

## 2019-05-17 NOTE — Assessment & Plan Note (Signed)
AN INDIVIDUAL CARE PLAN for hyperlipidemia/ cholesterol was established and reinforced today.  The patient's status was assessed using clinical findings on exam, lab and other diagnostic tests. The patient's disease status was assessed based on evidence-based guidelines and found to be fair controlled. MEDICATIONS were reviewed. SELF MANAGEMENT GOALS have been discussed and patient's success at attaining the goal of low cholesterol was assessed. RECOMMENDATION given include regular exercise 3 days a week and low cholesterol/low fat diet. CLINICAL SUMMARY including written plan to identify barriers unique to the patient due to social or economic  reasons was discussed. 

## 2019-05-17 NOTE — Assessment & Plan Note (Signed)

## 2019-05-18 LAB — LIPID PANEL
Chol/HDL Ratio: 3.2 ratio (ref 0.0–5.0)
Cholesterol, Total: 197 mg/dL (ref 100–199)
HDL: 62 mg/dL (ref >39)
LDL Chol Calc (NIH): 85 mg/dL (ref 0–99)
Triglycerides: 306 mg/dL — ABNORMAL HIGH (ref 0–149)
VLDL Cholesterol Cal: 50 mg/dL — ABNORMAL HIGH (ref 5–40)

## 2019-05-18 LAB — CBC WITH DIFFERENTIAL/PLATELET
Basophils Absolute: 0.1 10*3/uL (ref 0.0–0.2)
Basos: 1 %
EOS (ABSOLUTE): 0.3 10*3/uL (ref 0.0–0.4)
Eos: 3 %
Hematocrit: 45.4 % (ref 37.5–51.0)
Hemoglobin: 16 g/dL (ref 13.0–17.7)
Immature Grans (Abs): 0.1 10*3/uL (ref 0.0–0.1)
Immature Granulocytes: 1 %
Lymphocytes Absolute: 2.1 10*3/uL (ref 0.7–3.1)
Lymphs: 26 %
MCH: 32.9 pg (ref 26.6–33.0)
MCHC: 35.2 g/dL (ref 31.5–35.7)
MCV: 93 fL (ref 79–97)
Monocytes Absolute: 0.6 10*3/uL (ref 0.1–0.9)
Monocytes: 7 %
Neutrophils Absolute: 5.2 10*3/uL (ref 1.4–7.0)
Neutrophils: 62 %
Platelets: 289 10*3/uL (ref 150–450)
RBC: 4.87 x10E6/uL (ref 4.14–5.80)
RDW: 13.8 % (ref 11.6–15.4)
WBC: 8.3 10*3/uL (ref 3.4–10.8)

## 2019-05-18 LAB — COMPREHENSIVE METABOLIC PANEL
ALT: 17 IU/L (ref 0–44)
AST: 14 IU/L (ref 0–40)
Albumin/Globulin Ratio: 2.2 (ref 1.2–2.2)
Albumin: 4.3 g/dL (ref 3.8–4.8)
Alkaline Phosphatase: 66 IU/L (ref 39–117)
BUN/Creatinine Ratio: 22 (ref 10–24)
BUN: 19 mg/dL (ref 8–27)
Bilirubin Total: 0.3 mg/dL (ref 0.0–1.2)
CO2: 18 mmol/L — ABNORMAL LOW (ref 20–29)
Calcium: 9.8 mg/dL (ref 8.6–10.2)
Chloride: 106 mmol/L (ref 96–106)
Creatinine, Ser: 0.85 mg/dL (ref 0.76–1.27)
GFR calc Af Amer: 103 mL/min/{1.73_m2} (ref >59)
GFR calc non Af Amer: 89 mL/min/{1.73_m2} (ref >59)
Globulin, Total: 2 g/dL (ref 1.5–4.5)
Glucose: 125 mg/dL — ABNORMAL HIGH (ref 65–99)
Potassium: 4.3 mmol/L (ref 3.5–5.2)
Sodium: 143 mmol/L (ref 134–144)
Total Protein: 6.3 g/dL (ref 6.0–8.5)

## 2019-05-18 LAB — CARDIOVASCULAR RISK ASSESSMENT

## 2019-05-18 LAB — HEMOGLOBIN A1C
Est. average glucose Bld gHb Est-mCnc: 197 mg/dL
Hgb A1c MFr Bld: 8.5 % — ABNORMAL HIGH (ref 4.8–5.6)

## 2019-05-18 NOTE — Progress Notes (Signed)
CBC, Glucose 125, kidney and liver tests normal, , A1c 8.5 need under 8.0 lp

## 2019-05-29 ENCOUNTER — Other Ambulatory Visit: Payer: Self-pay | Admitting: Legal Medicine

## 2019-07-03 ENCOUNTER — Other Ambulatory Visit: Payer: Self-pay | Admitting: Legal Medicine

## 2019-07-13 ENCOUNTER — Other Ambulatory Visit: Payer: Self-pay | Admitting: Legal Medicine

## 2019-08-14 ENCOUNTER — Ambulatory Visit (INDEPENDENT_AMBULATORY_CARE_PROVIDER_SITE_OTHER): Payer: Medicare HMO | Admitting: Legal Medicine

## 2019-08-14 ENCOUNTER — Encounter: Payer: Self-pay | Admitting: Legal Medicine

## 2019-08-14 ENCOUNTER — Other Ambulatory Visit: Payer: Self-pay

## 2019-08-14 VITALS — BP 164/100 | HR 70 | Temp 98.1°F | Resp 17 | Ht 70.0 in | Wt 200.8 lb

## 2019-08-14 DIAGNOSIS — M1711 Unilateral primary osteoarthritis, right knee: Secondary | ICD-10-CM | POA: Diagnosis not present

## 2019-08-14 NOTE — Progress Notes (Signed)
Subjective:  Patient ID: Clayton James, male    DOB: 1950/06/27  Age: 69 y.o. MRN: [id number]  Chief Complaint  Patient presents with  . Knee Pain    Right knee pain    HPI: right knee osteoarthrits.  Increase pain in knee for 2 weeks and increse at night.   Current Outpatient Medications on File Prior to Visit  Medication Sig Dispense Refill  . ALPRAZolam (XANAX) 0.5 MG tablet Take 1 tablet by mouth twice daily as needed 60 tablet 3  . amLODipine-benazepril (LOTREL) 10-20 MG capsule Take 1 capsule by mouth once daily 90 capsule 2  . atorvastatin (LIPITOR) 40 MG tablet Take 1 tablet by mouth once daily 90 tablet 2  . BD PEN NEEDLE MICRO U/F 32G X 6 MM MISC Inject 1 each into the skin daily. use as directed 100 each 4  . Blood Glucose Monitoring Suppl (ONE TOUCH ULTRA 2) w/Device KIT USE TO CHECK GLUCOSE THREE TIMES DAILY    . Diclofenac Sodium CR 100 MG 24 hr tablet Take 100 mg by mouth daily.    Marland Kitchen FARXIGA 10 MG TABS tablet Take 1 tablet by mouth once daily 90 tablet 2  . insulin degludec (TRESIBA) 100 UNIT/ML FlexTouch Pen Inject 0.3 mLs (30 Units total) into the skin daily. 18 mL 1  . metFORMIN (GLUCOPHAGE) 1000 MG tablet Take 1 tablet by mouth twice daily 180 tablet 2   No current facility-administered medications on file prior to visit.   Past Medical History:  Diagnosis Date  . Benign essential hypertension 04/27/2019  . Chronic kidney disease, stage 3a 04/27/2019  . Mixed hyperlipidemia 04/27/2019  . Type 2 diabetes mellitus with diabetic polyneuropathy ([place]) 04/27/2019  . Type 2 diabetes mellitus with stage 3 chronic kidney disease ([place]) 04/27/2019   Past Surgical History:  Procedure Laterality Date  . ELBOW SURGERY Left 1994  . KNEE ARTHROSCOPY Right 1998  . KNEE CARTILAGE SURGERY Left 1994    History reviewed. No pertinent family history. Social History   Socioeconomic History  . Marital status: Married    Spouse name: Not on file  . Number of children: Not on file  .  Years of education: Not on file  . Highest education level: Not on file  Occupational History  . Not on file  Tobacco Use  . Smoking status: Former Smoker    Years: 8.00    Types: Cigarettes    Quit date: 2000    Years since quitting: 21.5  . Smokeless tobacco: Current User    Types: Chew  . Tobacco comment: 2 can per day  Substance and Sexual Activity  . Alcohol use: Yes    Alcohol/week: 1.0 standard drink    Types: 1 Cans of beer per week    Comment: one drink per dayd  . Drug use: Never  . Sexual activity: Yes    Partners: Female  Other Topics Concern  . Not on file  Social History Narrative  . Not on file   Social Determinants of Health   Financial Resource Strain:   . Difficulty of Paying Living Expenses:   Food Insecurity:   . Worried About Charity fundraiser in the Last Year:   . Arboriculturist in the Last Year:   Transportation Needs:   . Film/video editor (Medical):   Marland Kitchen Lack of Transportation (Non-Medical):   Physical Activity:   . Days of Exercise per Week:   . Minutes of Exercise per Session:  Stress:   . Feeling of Stress :   Social Connections:   . Frequency of Communication with Friends and Family:   . Frequency of Social Gatherings with Friends and Family:   . Attends Religious Services:   . Active Member of Clubs or Organizations:   . Attends Archivist Meetings:   Marland Kitchen Marital Status:     Review of Systems  Constitutional: Negative.   HENT: Negative.   Eyes: Negative.   Respiratory: Negative.   Cardiovascular: Negative.   Gastrointestinal: Negative.   Genitourinary: Negative.   Musculoskeletal: Positive for arthralgias.  Neurological: Negative.      Objective:  BP (!) 164/100 (BP Location: Right Arm, Patient Position: Sitting)   Pulse 70   Temp 98.1 F (36.7 C) (Temporal)   Resp 17   Ht 5' 10"  (1.778 m)   Wt 200 lb 12.8 oz (91.1 kg)   SpO2 97%   BMI 28.81 kg/m   BP/Weight 08/14/2019 05/17/2019 1/61/0960    Systolic BP 454 098 119  Diastolic BP 147 82 84  Wt. (Lbs) 200.8 195.2 194.6  BMI 28.81 28.01 27.92    Physical Exam Vitals reviewed.  Constitutional:      Appearance: Normal appearance.  Cardiovascular:     Rate and Rhythm: Normal rate and regular rhythm.     Pulses: Normal pulses.     Heart sounds: Normal heart sounds.  Pulmonary:     Effort: Pulmonary effort is normal.     Breath sounds: Normal breath sounds.  Musculoskeletal:        General: Tenderness present.     Comments: Pain right knee with deformity and crepitation.  Negative McMurray and Lachman  Neurological:     Mental Status: He is alert.       Lab Results  Component Value Date   WBC 8.3 05/17/2019   HGB 16.0 05/17/2019   HCT 45.4 05/17/2019   PLT 289 05/17/2019   GLUCOSE 125 (H) 05/17/2019   CHOL 197 05/17/2019   TRIG 306 (H) 05/17/2019   HDL 62 05/17/2019   LDLCALC 85 05/17/2019   ALT 17 05/17/2019   AST 14 05/17/2019   NA 143 05/17/2019   K 4.3 05/17/2019   CL 106 05/17/2019   CREATININE 0.85 05/17/2019   BUN 19 05/17/2019   CO2 18 (L) 05/17/2019   HGBA1C 8.5 (H) 05/17/2019   MICROALBUR 80 05/17/2019      Assessment & Plan:   1. Primary osteoarthritis of right knee The right knee was injected with kenalog, good relief.   After consent was obtained, using sterile technique the right knee was prepped and Ethyl Chloride was used as local anesthetic. The joint was entered and 33m's of fluid withdrawn and .  Steroid 80 mg and 3 ml plain Lidocaine was then injected and the needle withdrawn.  The procedure was well tolerated.  The patient is asked to continue to rest the joint for a few more days before resuming regular activities.  It may be more painful for the first 1-2 days.  Watch for fever, or increased swelling or persistent pain in the joint. Call or return to clinic prn if such symptoms occur or there is failure to improve as anticipated.      Follow-up: No follow-ups on file.  An  After Visit Summary was printed and given to the patient.  [phone]

## 2019-08-20 ENCOUNTER — Other Ambulatory Visit: Payer: Self-pay | Admitting: Legal Medicine

## 2019-08-28 ENCOUNTER — Encounter: Payer: Self-pay | Admitting: Legal Medicine

## 2019-08-28 ENCOUNTER — Other Ambulatory Visit: Payer: Self-pay

## 2019-08-28 ENCOUNTER — Ambulatory Visit (INDEPENDENT_AMBULATORY_CARE_PROVIDER_SITE_OTHER): Payer: Medicare HMO | Admitting: Legal Medicine

## 2019-08-28 VITALS — BP 160/80 | HR 75 | Temp 97.8°F | Resp 16 | Ht 70.0 in | Wt 198.0 lb

## 2019-08-28 DIAGNOSIS — M1712 Unilateral primary osteoarthritis, left knee: Secondary | ICD-10-CM

## 2019-08-28 DIAGNOSIS — I739 Peripheral vascular disease, unspecified: Secondary | ICD-10-CM | POA: Insufficient documentation

## 2019-08-28 NOTE — Progress Notes (Signed)
Subjective:  Patient ID: Clayton James, male    DOB: Oct 24, 1950  Age: 69 y.o. MRN: [id number]  Chief Complaint  Patient presents with  . Knee Pain    HPI: knee pain- chronic osteoarthritis left knee with crepitation and soreness.  He has poor pulses left foot.   Current Outpatient Medications on File Prior to Visit  Medication Sig Dispense Refill  . ALPRAZolam (XANAX) 0.5 MG tablet Take 1 tablet by mouth twice daily as needed 60 tablet 3  . amLODipine-benazepril (LOTREL) 10-20 MG capsule Take 1 capsule by mouth once daily 90 capsule 2  . atorvastatin (LIPITOR) 40 MG tablet Take 1 tablet by mouth once daily 90 tablet 2  . BD PEN NEEDLE MICRO U/F 32G X 6 MM MISC Inject 1 each into the skin daily. use as directed 100 each 4  . Blood Glucose Monitoring Suppl (ONE TOUCH ULTRA 2) w/Device KIT USE TO CHECK GLUCOSE THREE TIMES DAILY    . Diclofenac Sodium CR 100 MG 24 hr tablet Take 100 mg by mouth daily.    Marland Kitchen FARXIGA 10 MG TABS tablet Take 1 tablet by mouth once daily 90 tablet 2  . metFORMIN (GLUCOPHAGE) 1000 MG tablet Take 1 tablet by mouth twice daily 180 tablet 2  . TRESIBA FLEXTOUCH 100 UNIT/ML FlexTouch Pen INJECT 30 UNITS SUBCUTANEOUSLY ONCE DAILY 15 mL 6   No current facility-administered medications on file prior to visit.   Past Medical History:  Diagnosis Date  . Benign essential hypertension 04/27/2019  . Chronic kidney disease, stage 3a 04/27/2019  . Mixed hyperlipidemia 04/27/2019  . Type 2 diabetes mellitus with diabetic polyneuropathy ([place]) 04/27/2019  . Type 2 diabetes mellitus with stage 3 chronic kidney disease ([place]) 04/27/2019   Past Surgical History:  Procedure Laterality Date  . ELBOW SURGERY Left 1994  . KNEE ARTHROSCOPY Right 1998  . KNEE CARTILAGE SURGERY Left 1994    History reviewed. No pertinent family history. Social History   Socioeconomic History  . Marital status: Married    Spouse name: Not on file  . Number of children: Not on file  . Years of  education: Not on file  . Highest education level: Not on file  Occupational History  . Not on file  Tobacco Use  . Smoking status: Former Smoker    Years: 8.00    Types: Cigarettes    Quit date: 2000    Years since quitting: 21.6  . Smokeless tobacco: Current User    Types: Chew  . Tobacco comment: 2 can per day  Substance and Sexual Activity  . Alcohol use: Yes    Alcohol/week: 1.0 standard drink    Types: 1 Cans of beer per week    Comment: one drink per dayd  . Drug use: Never  . Sexual activity: Yes    Partners: Female  Other Topics Concern  . Not on file  Social History Narrative  . Not on file   Social Determinants of Health   Financial Resource Strain:   . Difficulty of Paying Living Expenses:   Food Insecurity:   . Worried About Charity fundraiser in the Last Year:   . Arboriculturist in the Last Year:   Transportation Needs:   . Film/video editor (Medical):   Marland Kitchen Lack of Transportation (Non-Medical):   Physical Activity:   . Days of Exercise per Week:   . Minutes of Exercise per Session:   Stress:   . Feeling of Stress :  Social Connections:   . Frequency of Communication with Friends and Family:   . Frequency of Social Gatherings with Friends and Family:   . Attends Religious Services:   . Active Member of Clubs or Organizations:   . Attends Archivist Meetings:   Marland Kitchen Marital Status:     Review of Systems  Constitutional: Negative.   HENT: Negative.   Eyes: Negative.   Respiratory: Negative.   Cardiovascular: Negative.   Gastrointestinal: Negative.   Genitourinary: Negative.   Musculoskeletal: Positive for arthralgias.  Neurological: Negative.   Psychiatric/Behavioral: Negative.      Objective:  BP (!) 160/80   Pulse 75   Temp 97.8 F (36.6 C)   Resp 16   Ht 5' 10"  (1.778 m)   Wt 198 lb (89.8 kg)   SpO2 96%   BMI 28.41 kg/m   BP/Weight 08/28/2019 08/14/2019 5/49/8264  Systolic BP 158 309 407  Diastolic BP 80 680 82    Wt. (Lbs) 198 200.8 195.2  BMI 28.41 28.81 28.01    Physical Exam Vitals reviewed.  Constitutional:      Appearance: Normal appearance.  HENT:     Right Ear: Tympanic membrane normal.     Left Ear: Tympanic membrane normal.     Mouth/Throat:     Mouth: Mucous membranes are moist.  Cardiovascular:     Rate and Rhythm: Normal rate and regular rhythm.     Pulses:          Dorsalis pedis pulses are 2+ on the right side and 1+ on the left side.       Posterior tibial pulses are 2+ on the right side and 1+ on the left side.     Heart sounds: Normal heart sounds.  Pulmonary:     Effort: Pulmonary effort is normal.     Breath sounds: Normal breath sounds.  Musculoskeletal:     Cervical back: Normal range of motion and neck supple.     Left knee: Swelling and effusion present. Decreased range of motion. Tenderness present.       Legs:  Neurological:     Mental Status: He is alert.     Diabetic Foot Exam - Simple   No data filed       Lab Results  Component Value Date   WBC 8.3 05/17/2019   HGB 16.0 05/17/2019   HCT 45.4 05/17/2019   PLT 289 05/17/2019   GLUCOSE 125 (H) 05/17/2019   CHOL 197 05/17/2019   TRIG 306 (H) 05/17/2019   HDL 62 05/17/2019   LDLCALC 85 05/17/2019   ALT 17 05/17/2019   AST 14 05/17/2019   NA 143 05/17/2019   K 4.3 05/17/2019   CL 106 05/17/2019   CREATININE 0.85 05/17/2019   BUN 19 05/17/2019   CO2 18 (L) 05/17/2019   HGBA1C 8.5 (H) 05/17/2019   MICROALBUR 80 05/17/2019      Assessment & Plan:   1. Primary osteoarthritis of left knee AN INDIVIDUAL CARE PLAN was established and reinforced today.  The patient's status was assessed using clinical findings on exam, labs, and other diagnostic testing. Patient's success at meeting treatment goals based on disease specific evidence-bassed guidelines and found to be in fair control. RECOMMENDATIONS include maintaining present medicines and treatment. Arthrocentesis performed 2. PVD  (peripheral vascular disease) ([place]) Patient has slow capillary filling left leg and need workup  After consent was obtained, using sterile technique the left knee was prepped and plain Ethyl Chloride was used  as local anesthetic. The joint was entered and 59m's of clear  colored fluid was withdrawn Steroid 80 mg and 3 ml plain Lidocaine was then injected and the needle withdrawn.  The procedure was well tolerated.  The patient is asked to continue to rest the joint for a few more days before resuming regular activities.  It may be more painful for the first 1-2 days.  Watch for fever, or increased swelling or persistent pain in the joint. Call or return to clinic prn if such symptoms occur or there is failure to improve as anticipated.         Follow-up: Return if symptoms worsen or fail to improve.  An After Visit Summary was printed and given to the patient.  [phone]

## 2019-09-15 ENCOUNTER — Ambulatory Visit: Payer: Medicare HMO | Admitting: Legal Medicine

## 2019-10-04 ENCOUNTER — Other Ambulatory Visit: Payer: Self-pay

## 2019-10-04 ENCOUNTER — Encounter: Payer: Self-pay | Admitting: Legal Medicine

## 2019-10-04 ENCOUNTER — Ambulatory Visit (INDEPENDENT_AMBULATORY_CARE_PROVIDER_SITE_OTHER): Payer: Medicare HMO | Admitting: Legal Medicine

## 2019-10-04 VITALS — BP 124/80 | HR 70 | Temp 97.8°F | Resp 16 | Ht 70.0 in | Wt 198.8 lb

## 2019-10-04 DIAGNOSIS — E1142 Type 2 diabetes mellitus with diabetic polyneuropathy: Secondary | ICD-10-CM

## 2019-10-04 DIAGNOSIS — I739 Peripheral vascular disease, unspecified: Secondary | ICD-10-CM

## 2019-10-04 DIAGNOSIS — Z6825 Body mass index (BMI) 25.0-25.9, adult: Secondary | ICD-10-CM | POA: Insufficient documentation

## 2019-10-04 DIAGNOSIS — M1712 Unilateral primary osteoarthritis, left knee: Secondary | ICD-10-CM | POA: Diagnosis not present

## 2019-10-04 DIAGNOSIS — I10 Essential (primary) hypertension: Secondary | ICD-10-CM

## 2019-10-04 DIAGNOSIS — E782 Mixed hyperlipidemia: Secondary | ICD-10-CM | POA: Diagnosis not present

## 2019-10-04 DIAGNOSIS — N1831 Chronic kidney disease, stage 3a: Secondary | ICD-10-CM

## 2019-10-04 DIAGNOSIS — Z6828 Body mass index (BMI) 28.0-28.9, adult: Secondary | ICD-10-CM | POA: Diagnosis not present

## 2019-10-04 DIAGNOSIS — Z6826 Body mass index (BMI) 26.0-26.9, adult: Secondary | ICD-10-CM | POA: Insufficient documentation

## 2019-10-04 MED ORDER — TRAMADOL HCL 50 MG PO TABS: 50.0000 mg | ORAL_TABLET | Freq: Three times a day (TID) | ORAL | 3 refills | Status: DC | PRN

## 2019-10-04 NOTE — Progress Notes (Signed)
Subjective:  Patient ID: Clayton James, male    DOB: 1950/10/01  Age: 69 y.o. MRN: [id number]  Chief Complaint  Patient presents with   Hyperlipidemia   Hypertension   Diabetes    HPI: chronic visit  Patient present with type 2 diabetes.  Specifically, this is type 2, insulin requiring diabetes, complicated by polyneuropathy, and nephropathy.  Compliance with treatment has been good; patient take medicines as directed, maintains diet and exercise regimen, follows up as directed, and is keeping glucose diary.  Date of  diagnosis 2010.  Depression screen has been performed.Tobacco screen nonsmoker. Current medicines for diabetes tresiba 30units bid, metformin.farxiga  Patient is on benazepril for renal protection and atorvastatin for cholesterol control.  Patient performs foot exams daily and last ophthalmologic exam was 1 year.  Patient presents for follow up of hypertension.  Patient tolerating amlodipine, benazepril well with side effects.  Patient was diagnosed with hypertension 2010 so has been treated for hypertension for 10 years.Patient is working on maintaining diet and exercise regimen and follows up as directed. Complication include none.  Patient presents with hyperlipidemia.  Compliance with treatment has been good; patient takes medicines as directed, maintains low cholesterol diet, follows up as directed, and maintains exercise regimen.  Patient is using atorvastatin without problems.   Current Outpatient Medications on File Prior to Visit  Medication Sig Dispense Refill   ALPRAZolam (XANAX) 0.5 MG tablet Take 1 tablet by mouth twice daily as needed 60 tablet 3   amLODipine-benazepril (LOTREL) 10-20 MG capsule Take 1 capsule by mouth once daily 90 capsule 2   atorvastatin (LIPITOR) 40 MG tablet Take 1 tablet by mouth once daily 90 tablet 2   BD PEN NEEDLE MICRO U/F 32G X 6 MM MISC Inject 1 each into the skin daily. use as directed 100 each 4   Blood Glucose Monitoring  Suppl (ONE TOUCH ULTRA 2) w/Device KIT USE TO CHECK GLUCOSE THREE TIMES DAILY     Diclofenac Sodium CR 100 MG 24 hr tablet Take 100 mg by mouth daily.     FARXIGA 10 MG TABS tablet Take 1 tablet by mouth once daily 90 tablet 2   metFORMIN (GLUCOPHAGE) 1000 MG tablet Take 1 tablet by mouth twice daily 180 tablet 2   TRESIBA FLEXTOUCH 100 UNIT/ML FlexTouch Pen INJECT 30 UNITS SUBCUTANEOUSLY ONCE DAILY 15 mL 6   No current facility-administered medications on file prior to visit.   Past Medical History:  Diagnosis Date   Benign essential hypertension 04/27/2019   Chronic kidney disease, stage 3a 04/27/2019   Mixed hyperlipidemia 04/27/2019   Type 2 diabetes mellitus with diabetic polyneuropathy ([place]) 04/27/2019   Type 2 diabetes mellitus with stage 3 chronic kidney disease ([place]) 04/27/2019   Past Surgical History:  Procedure Laterality Date   ELBOW SURGERY Left 1994   KNEE ARTHROSCOPY Right 1998   KNEE CARTILAGE SURGERY Left 1994    History reviewed. No pertinent family history. Social History   Socioeconomic History   Marital status: Married    Spouse name: Not on file   Number of children: Not on file   Years of education: Not on file   Highest education level: Not on file  Occupational History   Not on file  Tobacco Use   Smoking status: Former Smoker    Years: 8.00    Types: Cigarettes    Quit date: 2000    Years since quitting: 21.7   Smokeless tobacco: Current User    Types: Loss adjuster, chartered  Tobacco comment: 2 can per day  Substance and Sexual Activity   Alcohol use: Yes    Alcohol/week: 1.0 standard drink    Types: 1 Cans of beer per week    Comment: one drink per dayd   Drug use: Never   Sexual activity: Yes    Partners: Female  Other Topics Concern   Not on file  Social History Narrative   Not on file   Social Determinants of Health   Financial Resource Strain:    Difficulty of Paying Living Expenses: Not on file  Food Insecurity:    Worried  About Charity fundraiser in the Last Year: Not on file   [employer] of Food in the Last Year: Not on file  Transportation Needs:    Lack of Transportation (Medical): Not on file   Lack of Transportation (Non-Medical): Not on file  Physical Activity:    Days of Exercise per Week: Not on file   Minutes of Exercise per Session: Not on file  Stress:    Feeling of Stress : Not on file  Social Connections:    Frequency of Communication with Friends and Family: Not on file   Frequency of Social Gatherings with Friends and Family: Not on file   Attends Religious Services: Not on file   Active Member of Clubs or Organizations: Not on file   Attends Archivist Meetings: Not on file   Marital Status: Not on file    Review of Systems  Constitutional: Negative.   HENT: Negative.   Eyes: Negative.   Respiratory: Negative.  Negative for cough and choking.   Cardiovascular: Negative.  Negative for chest pain, palpitations and leg swelling.  Gastrointestinal: Negative.   Endocrine: Negative.   Genitourinary: Negative.   Musculoskeletal: Positive for arthralgias and back pain.  Skin: Negative.   Neurological: Negative.   Psychiatric/Behavioral: Negative.      Objective:  BP 124/80    Pulse 70    Temp 97.8 F (36.6 C)    Resp 16    Ht 5' 10"  (1.778 m)    Wt 198 lb 12.8 oz (90.2 kg)    SpO2 97%    BMI 28.52 kg/m   BP/Weight 10/04/2019 08/28/2019 5/64/3329  Systolic BP 518 841 660  Diastolic BP 80 80 630  Wt. (Lbs) 198.8 198 200.8  BMI 28.52 28.41 28.81    Physical Exam Vitals reviewed.  Constitutional:      Appearance: Normal appearance.  HENT:     Head: Normocephalic and atraumatic.     Right Ear: Tympanic membrane, ear canal and external ear normal.     Left Ear: Tympanic membrane, ear canal and external ear normal.     Mouth/Throat:     Mouth: Mucous membranes are moist.     Pharynx: Oropharynx is clear.  Eyes:     Extraocular Movements: Extraocular  movements intact.     Conjunctiva/sclera: Conjunctivae normal.     Pupils: Pupils are equal, round, and reactive to light.  Cardiovascular:     Rate and Rhythm: Normal rate and regular rhythm.     Pulses: Normal pulses.     Heart sounds: Normal heart sounds.  Pulmonary:     Effort: Pulmonary effort is normal.     Breath sounds: Normal breath sounds.  Abdominal:     General: Abdomen is flat. Bowel sounds are normal.     Palpations: Abdomen is soft.  Musculoskeletal:        General: Normal  range of motion.     Cervical back: Normal range of motion and neck supple.     Comments: Arthritis knee and back  Skin:    General: Skin is warm and dry.     Capillary Refill: Capillary refill takes less than 2 seconds.  Neurological:     General: No focal deficit present.     Mental Status: He is alert and oriented to person, place, and time. Mental status is at baseline.  Psychiatric:        Mood and Affect: Mood normal.        Thought Content: Thought content normal.        Judgment: Judgment normal.      Lab Results  Component Value Date   WBC 8.3 05/17/2019   HGB 16.0 05/17/2019   HCT 45.4 05/17/2019   PLT 289 05/17/2019   GLUCOSE 125 (H) 05/17/2019   CHOL 197 05/17/2019   TRIG 306 (H) 05/17/2019   HDL 62 05/17/2019   LDLCALC 85 05/17/2019   ALT 17 05/17/2019   AST 14 05/17/2019   NA 143 05/17/2019   K 4.3 05/17/2019   CL 106 05/17/2019   CREATININE 0.85 05/17/2019   BUN 19 05/17/2019   CO2 18 (L) 05/17/2019   HGBA1C 8.5 (H) 05/17/2019   MICROALBUR 80 05/17/2019      Assessment & Plan:   1. Chronic kidney disease, stage 3a AN INDIVIDUAL CARE PLAN for renal disease was established and reinforced today.  The patient's status was assessed using clinical findings on exam, labs, and other diagnostic testing. Patient's success at meeting treatment goals based on disease specific evidence-bassed guidelines and found to be in good control. RECOMMENDATIONS include maintaining  present medicines and treatment.  2. Benign essential hypertension An individual hypertension care plan was established and reinforced today.  The patient's status was assessed using clinical findings on exam and labs or diagnostic tests. The patient's success at meeting treatment goals on disease specific evidence-based guidelines and found to be well controlled. SELF MANAGEMENT: The patient and I together assessed ways to personally work towards obtaining the recommended goals. RECOMMENDATIONS: avoid decongestants found in common cold remedies, decrease consumption of alcohol, perform routine monitoring of BP with home BP cuff, exercise, reduction of dietary salt, take medicines as prescribed, try not to miss doses and quit smoking.  Regular exercise and maintaining a healthy weight is needed.  Stress reduction may help. A CLINICAL SUMMARY including written plan identify barriers to care unique to individual due to social or financial issues.  We attempt to mutually creat solutions for individual and family understanding.  3. Type 2 diabetes mellitus with diabetic polyneuropathy, without long-term current use of insulin ([place]) An individual care plan for diabetes was established and reinforced today.  The patient's status was assessed using clinical findings on exam, labs and diagnostic testing. Patient success at meeting goals based on disease specific evidence-based guidelines and found to be good controlled. Medications were assessed and patient's understanding of the medical issues , including barriers were assessed. Recommend adherence to a diabetic diet, a graduated exercise program, HgbA1c level is checked quarterly, and urine microalbumin performed yearly .  Annual mono-filament sensation testing performed. Lower blood pressure and control hyperlipidemia is important. Get annual eye exams and annual flu shots and smoking cessation discussed.  Self management goals were discussed.  4. Mixed  hyperlipidemia AN INDIVIDUAL CARE PLAN for hyperlipidemia/ cholesterol was established and reinforced today.  The patient's status was assessed using clinical  findings on exam, lab and other diagnostic tests. The patient's disease status was assessed based on evidence-based guidelines and found to be well controlled. MEDICATIONS were reviewed. SELF MANAGEMENT GOALS have been discussed and patient's success at attaining the goal of low cholesterol was assessed. RECOMMENDATION given include regular exercise 3 days a week and low cholesterol/low fat diet. CLINICAL SUMMARY including written plan to identify barriers unique to the patient due to social or economic  reasons was discussed.  5. PVD (peripheral vascular disease) ([place]) AN INDIVIDUAL CARE PLAN for peripheral vascular disease was established and reinforced today.  The patient's status was assessed using clinical findings on exam, labs, and other diagnostic testing. Patient's success at meeting treatment goals based on disease specific evidence-bassed guidelines and found to be in fair control. RECOMMENDATIONS include maintaining present medicines and treatment.         Follow-up: Return in about 4 months (around 02/03/2020) for fasting.  An After Visit Summary was printed and given to the patient.  [place] [phone]

## 2019-10-05 LAB — CBC WITH DIFFERENTIAL/PLATELET
Basophils Absolute: 0.1 10*3/uL (ref 0.0–0.2)
Basos: 1 %
EOS (ABSOLUTE): 0.2 10*3/uL (ref 0.0–0.4)
Eos: 3 %
Hematocrit: 47.8 % (ref 37.5–51.0)
Hemoglobin: 16.4 g/dL (ref 13.0–17.7)
Immature Grans (Abs): 0.1 10*3/uL (ref 0.0–0.1)
Immature Granulocytes: 1 %
Lymphocytes Absolute: 2.1 10*3/uL (ref 0.7–3.1)
Lymphs: 30 %
MCH: 32.5 pg (ref 26.6–33.0)
MCHC: 34.3 g/dL (ref 31.5–35.7)
MCV: 95 fL (ref 79–97)
Monocytes Absolute: 0.6 10*3/uL (ref 0.1–0.9)
Monocytes: 9 %
Neutrophils Absolute: 4 10*3/uL (ref 1.4–7.0)
Neutrophils: 56 %
Platelets: 317 10*3/uL (ref 150–450)
RBC: 5.04 x10E6/uL (ref 4.14–5.80)
RDW: 13.4 % (ref 11.6–15.4)
WBC: 7 10*3/uL (ref 3.4–10.8)

## 2019-10-05 LAB — COMPREHENSIVE METABOLIC PANEL
ALT: 17 IU/L (ref 0–44)
AST: 12 IU/L (ref 0–40)
Albumin/Globulin Ratio: 2.1 (ref 1.2–2.2)
Albumin: 4.7 g/dL (ref 3.8–4.8)
Alkaline Phosphatase: 65 IU/L (ref 44–121)
BUN/Creatinine Ratio: 23 (ref 10–24)
BUN: 20 mg/dL (ref 8–27)
Bilirubin Total: 0.5 mg/dL (ref 0.0–1.2)
CO2: 24 mmol/L (ref 20–29)
Calcium: 9.5 mg/dL (ref 8.6–10.2)
Chloride: 104 mmol/L (ref 96–106)
Creatinine, Ser: 0.88 mg/dL (ref 0.76–1.27)
GFR calc Af Amer: 101 mL/min/{1.73_m2} (ref >59)
GFR calc non Af Amer: 88 mL/min/{1.73_m2} (ref >59)
Globulin, Total: 2.2 g/dL (ref 1.5–4.5)
Glucose: 76 mg/dL (ref 65–99)
Potassium: 4.5 mmol/L (ref 3.5–5.2)
Sodium: 142 mmol/L (ref 134–144)
Total Protein: 6.9 g/dL (ref 6.0–8.5)

## 2019-10-05 LAB — HEMOGLOBIN A1C
Est. average glucose Bld gHb Est-mCnc: 169 mg/dL
Hgb A1c MFr Bld: 7.5 % — ABNORMAL HIGH (ref 4.8–5.6)

## 2019-10-05 LAB — LIPID PANEL
Chol/HDL Ratio: 3.1 ratio (ref 0.0–5.0)
Cholesterol, Total: 175 mg/dL (ref 100–199)
HDL: 57 mg/dL (ref >39)
LDL Chol Calc (NIH): 102 mg/dL — ABNORMAL HIGH (ref 0–99)
Triglycerides: 84 mg/dL (ref 0–149)
VLDL Cholesterol Cal: 16 mg/dL (ref 5–40)

## 2019-10-05 LAB — CARDIOVASCULAR RISK ASSESSMENT

## 2019-10-05 NOTE — Progress Notes (Signed)
CBC normal, kidney and liver tests normal, LDL-c 102, A1c 7.5 lp

## 2019-11-05 ENCOUNTER — Other Ambulatory Visit: Payer: Self-pay | Admitting: Legal Medicine

## 2019-11-09 ENCOUNTER — Other Ambulatory Visit: Payer: Self-pay | Admitting: Legal Medicine

## 2019-11-27 ENCOUNTER — Other Ambulatory Visit: Payer: Self-pay | Admitting: Legal Medicine

## 2019-11-27 DIAGNOSIS — M1712 Unilateral primary osteoarthritis, left knee: Secondary | ICD-10-CM

## 2019-12-02 ENCOUNTER — Other Ambulatory Visit: Payer: Self-pay | Admitting: Legal Medicine

## 2019-12-02 DIAGNOSIS — I10 Essential (primary) hypertension: Secondary | ICD-10-CM

## 2019-12-26 ENCOUNTER — Other Ambulatory Visit: Payer: Self-pay

## 2019-12-26 ENCOUNTER — Ambulatory Visit (INDEPENDENT_AMBULATORY_CARE_PROVIDER_SITE_OTHER): Payer: Medicare HMO | Admitting: Legal Medicine

## 2019-12-26 ENCOUNTER — Encounter: Payer: Self-pay | Admitting: Legal Medicine

## 2019-12-26 VITALS — BP 140/80 | HR 85 | Temp 97.8°F | Resp 16 | Ht 70.0 in | Wt 195.0 lb

## 2019-12-26 DIAGNOSIS — M1711 Unilateral primary osteoarthritis, right knee: Secondary | ICD-10-CM

## 2019-12-26 NOTE — Progress Notes (Signed)
Subjective:  Patient ID: Clayton James, male    DOB: 07/22/1950  Age: 69 y.o. MRN: [id number]  Chief Complaint  Patient presents with  . Knee Pain    R knee pain for about a month, requesting injection    HPI: right knee osteoarthritis. He has pain every day.  Some crepitation. Right knee has some giving out but no locking.  He remains hunting.    Current Outpatient Medications on File Prior to Visit  Medication Sig Dispense Refill  . ALPRAZolam (XANAX) 0.5 MG tablet Take 1 tablet by mouth twice daily as needed 60 tablet 3  . amLODipine-benazepril (LOTREL) 10-20 MG capsule Take 1 capsule by mouth once daily 90 capsule 2  . atorvastatin (LIPITOR) 40 MG tablet Take 1 tablet by mouth once daily 90 tablet 2  . BD PEN NEEDLE MICRO U/F 32G X 6 MM MISC Inject 1 each into the skin daily. use as directed 100 each 4  . Blood Glucose Monitoring Suppl (ONE TOUCH ULTRA 2) w/Device KIT USE TO CHECK GLUCOSE THREE TIMES DAILY    . Diclofenac Sodium CR 100 MG 24 hr tablet Take 1 tablet by mouth once daily 90 tablet 2  . FARXIGA 10 MG TABS tablet Take 1 tablet by mouth once daily 90 tablet 2  . metFORMIN (GLUCOPHAGE) 1000 MG tablet Take 1 tablet by mouth twice daily 180 tablet 2  . traMADol (ULTRAM) 50 MG tablet TAKE 1 TABLET BY MOUTH EVERY 8 HOURS AS NEEDED FOR MODERATE PAIN 30 tablet 5  . TRESIBA FLEXTOUCH 100 UNIT/ML FlexTouch Pen INJECT 30 UNITS SUBCUTANEOUSLY ONCE DAILY 15 mL 6   No current facility-administered medications on file prior to visit.   Past Medical History:  Diagnosis Date  . Benign essential hypertension 04/27/2019  . Chronic kidney disease, stage 3a ([place]) 04/27/2019  . Mixed hyperlipidemia 04/27/2019  . Type 2 diabetes mellitus with diabetic polyneuropathy ([place]) 04/27/2019  . Type 2 diabetes mellitus with stage 3 chronic kidney disease ([place]) 04/27/2019   Past Surgical History:  Procedure Laterality Date  . ELBOW SURGERY Left 1994  . KNEE ARTHROSCOPY Right 1998  . KNEE CARTILAGE  SURGERY Left 1994    No family history on file. Social History   Socioeconomic History  . Marital status: Married    Spouse name: Not on file  . Number of children: Not on file  . Years of education: Not on file  . Highest education level: Not on file  Occupational History  . Not on file  Tobacco Use  . Smoking status: Former Smoker    Years: 8.00    Types: Cigarettes    Quit date: 2000    Years since quitting: 21.9  . Smokeless tobacco: Current User    Types: Chew  . Tobacco comment: 2 can per day  Vaping Use  . Vaping Use: Never used  Substance and Sexual Activity  . Alcohol use: Yes    Alcohol/week: 1.0 standard drink    Types: 1 Cans of beer per week    Comment: one drink per dayd  . Drug use: Never  . Sexual activity: Yes    Partners: Female  Other Topics Concern  . Not on file  Social History Narrative  . Not on file   Social Determinants of Health   Financial Resource Strain:   . Difficulty of Paying Living Expenses: Not on file  Food Insecurity:   . Worried About Charity fundraiser in the Last Year: Not on file  .  Ran Out of Food in the Last Year: Not on file  Transportation Needs:   . Lack of Transportation (Medical): Not on file  . Lack of Transportation (Non-Medical): Not on file  Physical Activity:   . Days of Exercise per Week: Not on file  . Minutes of Exercise per Session: Not on file  Stress:   . Feeling of Stress : Not on file  Social Connections:   . Frequency of Communication with Friends and Family: Not on file  . Frequency of Social Gatherings with Friends and Family: Not on file  . Attends Religious Services: Not on file  . Active Member of Clubs or Organizations: Not on file  . Attends Archivist Meetings: Not on file  . Marital Status: Not on file    Review of Systems  Constitutional: Negative for activity change, appetite change and fever.  HENT: Negative for congestion, ear pain and sinus pain.   Eyes: Negative for  visual disturbance.  Respiratory: Negative for cough, wheezing and stridor.   Cardiovascular: Negative for chest pain, palpitations and leg swelling.  Gastrointestinal: Negative for abdominal distention and abdominal pain.  Endocrine: Negative for polyuria.  Genitourinary: Negative for difficulty urinating, dysuria and urgency.  Musculoskeletal: Positive for arthralgias.  Neurological: Negative for dizziness, facial asymmetry and weakness.  Psychiatric/Behavioral: Negative for agitation and confusion.     Objective:  BP 140/80 (BP Location: Right Arm, Patient Position: Sitting, Cuff Size: Normal)   Pulse 85   Temp 97.8 F (36.6 C) (Temporal)   Resp 16   Ht _0  (1.778 m)   Wt 195 lb (88.5 kg)   SpO2 96%   BMI 27.98 kg/m   BP/Weight 12/26/2019 7/98/9211 09/23/1738  Systolic BP 814 481 856  Diastolic BP 80 80 80  Wt. (Lbs) 195 198.8 198  BMI 27.98 28.52 28.41    Physical Exam Vitals reviewed.  Constitutional:      Appearance: Normal appearance.  Cardiovascular:     Rate and Rhythm: Normal rate and regular rhythm.     Pulses: Normal pulses.     Heart sounds: Normal heart sounds.  Pulmonary:     Effort: Pulmonary effort is normal.     Breath sounds: Normal breath sounds.  Musculoskeletal:     Comments: Pain right knee, crepitation, negative Lachman and McMurray  Neurological:     Mental Status: He is alert.       Lab Results  Component Value Date   WBC 7.0 10/04/2019   HGB 16.4 10/04/2019   HCT 47.8 10/04/2019   PLT 317 10/04/2019   GLUCOSE 76 10/04/2019   CHOL 175 10/04/2019   TRIG 84 10/04/2019   HDL 57 10/04/2019   LDLCALC 102 (H) 10/04/2019   ALT 17 10/04/2019   AST 12 10/04/2019   NA 142 10/04/2019   K 4.5 10/04/2019   CL 104 10/04/2019   CREATININE 0.88 10/04/2019   BUN 20 10/04/2019   CO2 24 10/04/2019   HGBA1C 7.5 (H) 10/04/2019   MICROALBUR 80 05/17/2019      Assessment & Plan:   Diagnoses and all orders for this visit: Primary  osteoarthritis of right knee The right knee was injected with kenalog with pain relief.  He is walking better  After consent was obtained, using sterile technique the right knee was prepped and plain Lidocaine 1% was used as local anesthetic. The joint was entered and *.  Steroid 42m mg and 3 ml plain Lidocaine was then injected and the needle  withdrawn.  The procedure was well tolerated.  The patient is asked to continue to rest the joint for a few more days before resuming regular activities.  It may be more painful for the first 1-2 days.  Watch for fever, or increased swelling or persistent pain in the joint. Call or return to clinic prn if such symptoms occur or there is failure to improve as anticipated. .        I spent 25 minutes dedicated to the care of this patient on the date of this encounter to include face-to-face time with the patient,   Follow-up: Return in about 2 weeks (around 01/09/2020).  An After Visit Summary was printed and given to the patient.  Reinaldo Meeker, MD [facility] [phone]

## 2020-01-08 ENCOUNTER — Other Ambulatory Visit: Payer: Self-pay | Admitting: Legal Medicine

## 2020-01-15 ENCOUNTER — Ambulatory Visit: Payer: Medicare HMO | Admitting: Legal Medicine

## 2020-01-18 DIAGNOSIS — Z20828 Contact with and (suspected) exposure to other viral communicable diseases: Secondary | ICD-10-CM | POA: Diagnosis not present

## 2020-01-18 DIAGNOSIS — J029 Acute pharyngitis, unspecified: Secondary | ICD-10-CM | POA: Diagnosis not present

## 2020-02-05 ENCOUNTER — Ambulatory Visit: Payer: Medicare HMO | Admitting: Legal Medicine

## 2020-02-07 ENCOUNTER — Other Ambulatory Visit: Payer: Self-pay | Admitting: Legal Medicine

## 2020-02-07 DIAGNOSIS — M1712 Unilateral primary osteoarthritis, left knee: Secondary | ICD-10-CM

## 2020-02-14 ENCOUNTER — Other Ambulatory Visit: Payer: Self-pay | Admitting: Legal Medicine

## 2020-02-14 DIAGNOSIS — E1142 Type 2 diabetes mellitus with diabetic polyneuropathy: Secondary | ICD-10-CM

## 2020-02-15 ENCOUNTER — Ambulatory Visit (INDEPENDENT_AMBULATORY_CARE_PROVIDER_SITE_OTHER): Payer: Medicare HMO | Admitting: Legal Medicine

## 2020-02-15 ENCOUNTER — Encounter: Payer: Self-pay | Admitting: Legal Medicine

## 2020-02-15 ENCOUNTER — Other Ambulatory Visit: Payer: Self-pay

## 2020-02-15 VITALS — BP 130/80 | HR 78 | Temp 97.3°F | Resp 16 | Ht 70.0 in | Wt 195.0 lb

## 2020-02-15 DIAGNOSIS — M1712 Unilateral primary osteoarthritis, left knee: Secondary | ICD-10-CM | POA: Diagnosis not present

## 2020-02-15 DIAGNOSIS — E782 Mixed hyperlipidemia: Secondary | ICD-10-CM | POA: Diagnosis not present

## 2020-02-15 DIAGNOSIS — Z794 Long term (current) use of insulin: Secondary | ICD-10-CM

## 2020-02-15 DIAGNOSIS — I10 Essential (primary) hypertension: Secondary | ICD-10-CM | POA: Diagnosis not present

## 2020-02-15 DIAGNOSIS — I739 Peripheral vascular disease, unspecified: Secondary | ICD-10-CM | POA: Diagnosis not present

## 2020-02-15 DIAGNOSIS — E1142 Type 2 diabetes mellitus with diabetic polyneuropathy: Secondary | ICD-10-CM

## 2020-02-15 DIAGNOSIS — N1831 Chronic kidney disease, stage 3a: Secondary | ICD-10-CM | POA: Diagnosis not present

## 2020-02-15 NOTE — Progress Notes (Signed)
Subjective:  Patient ID: Clayton James, male    DOB: 21-Sep-1950  Age: 70 y.o. MRN: [id number]  Chief Complaint  Patient presents with  . Diabetes  . Hypertension    HPI: Chronic visit  Patient present with type 2 diabetes.  Specifically, this is type 2, insulin requiring diabetes, complicated by polyneuropathy.  Compliance with treatment has been good; patient take medicines as directed, maintains diet and exercise regimen, follows up as directed, and is keeping glucose diary.  Date of  diagnosis 2010.  Depression screen has been performed.Tobacco screen nonsmker. Current medicines for diabetes metformin, farxiga, tresiba 30units.  Patient is on nothing for renal protection and atorvastatin for cholesterol control.  Patient performs foot exams daily and last ophthalmologic exam was no.  Patient presents for follow up of hypertension.  Patient tolerating amlodipine,  well with side effects.  Patient was diagnosed with hypertension 2010 so has been treated for hypertension for 10 years.Patient is working on maintaining diet and exercise regimen and follows up as directed. Complication include none.   Current Outpatient Medications on File Prior to Visit  Medication Sig Dispense Refill  . ALPRAZolam (XANAX) 0.5 MG tablet Take 1 tablet by mouth twice daily as needed 60 tablet 3  . amLODipine-benazepril (LOTREL) 10-20 MG capsule Take 1 capsule by mouth once daily 90 capsule 2  . atorvastatin (LIPITOR) 40 MG tablet Take 1 tablet by mouth once daily 90 tablet 2  . BD PEN NEEDLE MICRO U/F 32G X 6 MM MISC USE AS DIRECTED ONCE DAILY 100 each 4  . Blood Glucose Monitoring Suppl (ONE TOUCH ULTRA 2) w/Device KIT USE TO CHECK GLUCOSE THREE TIMES DAILY    . Diclofenac Sodium CR 100 MG 24 hr tablet Take 1 tablet by mouth once daily 90 tablet 2  . FARXIGA 10 MG TABS tablet Take 1 tablet by mouth once daily 90 tablet 2  . metFORMIN (GLUCOPHAGE) 1000 MG tablet Take 1 tablet by mouth twice daily 180 tablet 2   . traMADol (ULTRAM) 50 MG tablet TAKE 1 TABLET BY MOUTH EVERY 8 HOURS AS NEEDED FOR MODERATE PAIN 30 tablet 3  . TRESIBA FLEXTOUCH 100 UNIT/ML FlexTouch Pen INJECT 30 UNITS SUBCUTANEOUSLY ONCE DAILY 15 mL 6   No current facility-administered medications on file prior to visit.   Past Medical History:  Diagnosis Date  . Benign essential hypertension 04/27/2019  . Chronic kidney disease, stage 3a ([place]) 04/27/2019  . Mixed hyperlipidemia 04/27/2019  . Type 2 diabetes mellitus with diabetic polyneuropathy ([place]) 04/27/2019  . Type 2 diabetes mellitus with stage 3 chronic kidney disease ([place]) 04/27/2019   Past Surgical History:  Procedure Laterality Date  . ELBOW SURGERY Left 1994  . KNEE ARTHROSCOPY Right 1998  . KNEE CARTILAGE SURGERY Left 1994    History reviewed. No pertinent family history. Social History   Socioeconomic History  . Marital status: Married    Spouse name: Not on file  . Number of children: Not on file  . Years of education: Not on file  . Highest education level: Not on file  Occupational History  . Not on file  Tobacco Use  . Smoking status: Former Smoker    Years: 8.00    Types: Cigarettes    Quit date: 2000    Years since quitting: 22.0  . Smokeless tobacco: Current User    Types: Chew  . Tobacco comment: 2 can per day  Vaping Use  . Vaping Use: Never used  Substance and Sexual Activity  .  Alcohol use: Yes    Alcohol/week: 1.0 standard drink    Types: 1 Cans of beer per week    Comment: one drink per dayd  . Drug use: Never  . Sexual activity: Yes    Partners: Female  Other Topics Concern  . Not on file  Social History Narrative  . Not on file   Social Determinants of Health   Financial Resource Strain: Not on file  Food Insecurity: Not on file  Transportation Needs: Not on file  Physical Activity: Not on file  Stress: Not on file  Social Connections: Not on file    Review of Systems  Constitutional: Negative for activity change and  diaphoresis.  HENT: Negative for congestion, ear pain and sinus pain.   Eyes: Negative for visual disturbance.  Respiratory: Negative for chest tightness and shortness of breath.   Cardiovascular: Negative for chest pain, palpitations and leg swelling.  Gastrointestinal: Negative for abdominal distention and abdominal pain.  Endocrine: Negative for polyuria.  Genitourinary: Negative for difficulty urinating, dysuria and urgency.  Musculoskeletal: Negative for arthralgias and back pain.  Skin: Negative.   Neurological: Negative.   Psychiatric/Behavioral: Negative.      Objective:  BP 130/80   Pulse 78   Temp (!) 97.3 F (36.3 C)   Resp 16   Ht 5' 10"  (1.778 m)   Wt 195 lb (88.5 kg)   SpO2 98%   BMI 27.98 kg/m   BP/Weight 02/15/2020 12/26/2019 9/37/1696  Systolic BP 789 381 017  Diastolic BP 80 80 80  Wt. (Lbs) 195 195 198.8  BMI 27.98 27.98 28.52    Physical Exam Vitals reviewed.  Constitutional:      Appearance: Normal appearance.  HENT:     Head: Normocephalic.     Right Ear: Tympanic membrane normal.     Left Ear: Tympanic membrane normal.     Nose: Nose normal.     Mouth/Throat:     Mouth: Mucous membranes are dry.     Pharynx: Oropharynx is clear.  Eyes:     Extraocular Movements: Extraocular movements intact.     Conjunctiva/sclera: Conjunctivae normal.     Pupils: Pupils are equal, round, and reactive to light.  Cardiovascular:     Rate and Rhythm: Normal rate and regular rhythm.     Pulses: Normal pulses.     Heart sounds: Normal heart sounds. No murmur heard. No gallop.   Pulmonary:     Effort: Pulmonary effort is normal. No respiratory distress.     Breath sounds: Normal breath sounds. No rales.  Abdominal:     General: Abdomen is flat. Bowel sounds are normal.     Palpations: Abdomen is soft.  Musculoskeletal:     Cervical back: Normal range of motion and neck supple.  Skin:    General: Skin is warm and dry.     Capillary Refill: Capillary  refill takes less than 2 seconds.  Neurological:     General: No focal deficit present.     Mental Status: He is alert and oriented to person, place, and time. Mental status is at baseline.     Sensory: No sensory deficit.  Psychiatric:        Mood and Affect: Mood normal.        Behavior: Behavior normal.        Thought Content: Thought content normal.        Judgment: Judgment normal.     Diabetic Foot Exam - Simple   Simple  Foot Form Diabetic Foot exam was performed with the following findings: Yes 02/15/2020  9:47 AM  Visual Inspection No deformities, no ulcerations, no other skin breakdown bilaterally: Yes Sensation Testing See comments: Yes Pulse Check Posterior Tibialis and Dorsalis pulse intact bilaterally: Yes Comments Decreased sensation feet      Lab Results  Component Value Date   WBC 7.0 10/04/2019   HGB 16.4 10/04/2019   HCT 47.8 10/04/2019   PLT 317 10/04/2019   GLUCOSE 76 10/04/2019   CHOL 175 10/04/2019   TRIG 84 10/04/2019   HDL 57 10/04/2019   LDLCALC 102 (H) 10/04/2019   ALT 17 10/04/2019   AST 12 10/04/2019   NA 142 10/04/2019   K 4.5 10/04/2019   CL 104 10/04/2019   CREATININE 0.88 10/04/2019   BUN 20 10/04/2019   CO2 24 10/04/2019   HGBA1C 7.5 (H) 10/04/2019   MICROALBUR 80 05/17/2019      Assessment & Plan:   1. Chronic kidney disease, stage 3a ([place]) AN INDIVIDUAL CARE PLAN for renal disease was established and reinforced today.  The patient's status was assessed using clinical findings on exam, labs, and other diagnostic testing. Patient's success at meeting treatment goals based on disease specific evidence-bassed guidelines and found to be in good control. RECOMMENDATIONS include maintaining present medicines and treatment.  2. Benign essential hypertension - CBC with Differential/Platelet - Comprehensive metabolic panel An individual hypertension care plan was established and reinforced today.  The patient's status was assessed  using clinical findings on exam and labs or diagnostic tests. The patient's success at meeting treatment goals on disease specific evidence-based guidelines and found to be well controlled. SELF MANAGEMENT: The patient and I together assessed ways to personally work towards obtaining the recommended goals. RECOMMENDATIONS: avoid decongestants found in common cold remedies, decrease consumption of alcohol, perform routine monitoring of BP with home BP cuff, exercise, reduction of dietary salt, take medicines as prescribed, try not to miss doses and quit smoking.  Regular exercise and maintaining a healthy weight is needed.  Stress reduction may help. A CLINICAL SUMMARY including written plan identify barriers to care unique to individual due to social or financial issues.  We attempt to mutually creat solutions for individual and family understanding.  3. Type 2 diabetes mellitus with diabetic polyneuropathy, with long-term current use of insulin ([facility]) - Hemoglobin A1c An individual care plan for diabetes was established and reinforced today.  The patient's status was assessed using clinical findings on exam, labs and diagnostic testing. Patient success at meeting goals based on disease specific evidence-based guidelines and found to be fair controlled. Medications were assessed and patient's understanding of the medical issues , including barriers were assessed. Recommend adherence to a diabetic diet, a graduated exercise program, HgbA1c level is checked quarterly, and urine microalbumin performed yearly .  Annual mono-filament sensation testing performed. Lower blood pressure and control hyperlipidemia is important. Get annual eye exams and annual flu shots and smoking cessation discussed.  Self management goals were discussed.  4. Mixed hyperlipidemia - Lipid panel AN INDIVIDUAL CARE PLAN for hyperlipidemia/ cholesterol was established and reinforced today.  The patient's status was assessed using  clinical findings on exam, lab and other diagnostic tests. The patient's disease status was assessed based on evidence-based guidelines and found to be well controlled. MEDICATIONS were reviewed. SELF MANAGEMENT GOALS have been discussed and patient's success at attaining the goal of low cholesterol was assessed. RECOMMENDATION given include regular exercise 3 days a week and low  cholesterol/low fat diet. CLINICAL SUMMARY including written plan to identify barriers unique to the patient due to social or economic  reasons was discussed.  5. PVD (peripheral vascular disease) ([place]) Patient has PVD but no claudication and is doing well  6. Primary osteoarthritis of left knee Patient has OA left knee with pain walking.  He requests arthrocentesis  After consent was obtained, using sterile technique the left knee was prepped and plain Ethyl Chloride was used as local anesthetic. The joint was entered aand.  Steroid 80 mg and 3 ml plain Lidocaine was then injected and the needle withdrawn.  The procedure was well tolerated.  The patient is asked to continue to rest the joint for a few more days before resuming regular activities.  It may be more painful for the first 1-2 days.  Watch for fever, or increased swelling or persistent pain in the joint. Call or return to clinic prn if such symptoms occur or there is failure to improve as anticipated.     Orders Placed This Encounter  Procedures  . CBC with Differential/Platelet  . Comprehensive metabolic panel  . Lipid panel  . Hemoglobin A1c      I spent 40 minutes dedicated to the care of this patient on the date of this encounter to include face-to-face time with the patient, as well as:   Follow-up: Return in about 4 months (around 06/14/2020) for fasting.  An After Visit Summary was printed and given to the patient.  Reinaldo Meeker, MD [facility] [phone]

## 2020-02-16 LAB — COMPREHENSIVE METABOLIC PANEL
ALT: 17 IU/L (ref 0–44)
AST: 14 IU/L (ref 0–40)
Albumin/Globulin Ratio: 2 (ref 1.2–2.2)
Albumin: 4.5 g/dL (ref 3.8–4.8)
Alkaline Phosphatase: 68 IU/L (ref 44–121)
BUN/Creatinine Ratio: 16 (ref 10–24)
BUN: 12 mg/dL (ref 8–27)
Bilirubin Total: 0.5 mg/dL (ref 0.0–1.2)
CO2: 22 mmol/L (ref 20–29)
Calcium: 9.5 mg/dL (ref 8.6–10.2)
Chloride: 106 mmol/L (ref 96–106)
Creatinine, Ser: 0.74 mg/dL — ABNORMAL LOW (ref 0.76–1.27)
GFR calc Af Amer: 109 mL/min/{1.73_m2} (ref >59)
GFR calc non Af Amer: 94 mL/min/{1.73_m2} (ref >59)
Globulin, Total: 2.3 g/dL (ref 1.5–4.5)
Glucose: 101 mg/dL — ABNORMAL HIGH (ref 65–99)
Potassium: 4.2 mmol/L (ref 3.5–5.2)
Sodium: 145 mmol/L — ABNORMAL HIGH (ref 134–144)
Total Protein: 6.8 g/dL (ref 6.0–8.5)

## 2020-02-16 LAB — CBC WITH DIFFERENTIAL/PLATELET
Basophils Absolute: 0.1 10*3/uL (ref 0.0–0.2)
Basos: 1 %
EOS (ABSOLUTE): 0.3 10*3/uL (ref 0.0–0.4)
Eos: 4 %
Hematocrit: 47.3 % (ref 37.5–51.0)
Hemoglobin: 16.2 g/dL (ref 13.0–17.7)
Immature Grans (Abs): 0 10*3/uL (ref 0.0–0.1)
Immature Granulocytes: 1 %
Lymphocytes Absolute: 1.6 10*3/uL (ref 0.7–3.1)
Lymphs: 25 %
MCH: 31.8 pg (ref 26.6–33.0)
MCHC: 34.2 g/dL (ref 31.5–35.7)
MCV: 93 fL (ref 79–97)
Monocytes Absolute: 0.6 10*3/uL (ref 0.1–0.9)
Monocytes: 9 %
Neutrophils Absolute: 4.1 10*3/uL (ref 1.4–7.0)
Neutrophils: 60 %
Platelets: 288 10*3/uL (ref 150–450)
RBC: 5.1 x10E6/uL (ref 4.14–5.80)
RDW: 13.6 % (ref 11.6–15.4)
WBC: 6.7 10*3/uL (ref 3.4–10.8)

## 2020-02-16 LAB — LIPID PANEL
Chol/HDL Ratio: 2.4 ratio (ref 0.0–5.0)
Cholesterol, Total: 182 mg/dL (ref 100–199)
HDL: 75 mg/dL (ref >39)
LDL Chol Calc (NIH): 93 mg/dL (ref 0–99)
Triglycerides: 77 mg/dL (ref 0–149)
VLDL Cholesterol Cal: 14 mg/dL (ref 5–40)

## 2020-02-16 LAB — HEMOGLOBIN A1C
Est. average glucose Bld gHb Est-mCnc: 146 mg/dL
Hgb A1c MFr Bld: 6.7 % — ABNORMAL HIGH (ref 4.8–5.6)

## 2020-02-16 LAB — CARDIOVASCULAR RISK ASSESSMENT

## 2020-02-16 NOTE — Progress Notes (Signed)
Cbc normal, glucose 101, kidney tests normal, liver tests normal, Cholesterol normal, A1c 6.7 lp

## 2020-03-05 ENCOUNTER — Other Ambulatory Visit: Payer: Self-pay | Admitting: Legal Medicine

## 2020-03-05 DIAGNOSIS — F419 Anxiety disorder, unspecified: Secondary | ICD-10-CM

## 2020-03-19 ENCOUNTER — Other Ambulatory Visit: Payer: Self-pay | Admitting: Legal Medicine

## 2020-03-19 DIAGNOSIS — M1712 Unilateral primary osteoarthritis, left knee: Secondary | ICD-10-CM

## 2020-04-02 ENCOUNTER — Other Ambulatory Visit: Payer: Self-pay | Admitting: Legal Medicine

## 2020-04-18 ENCOUNTER — Ambulatory Visit (INDEPENDENT_AMBULATORY_CARE_PROVIDER_SITE_OTHER): Payer: Medicare HMO | Admitting: Legal Medicine

## 2020-04-18 ENCOUNTER — Other Ambulatory Visit: Payer: Self-pay

## 2020-04-18 ENCOUNTER — Encounter: Payer: Self-pay | Admitting: Legal Medicine

## 2020-04-18 DIAGNOSIS — D2239 Melanocytic nevi of other parts of face: Secondary | ICD-10-CM | POA: Diagnosis not present

## 2020-04-18 DIAGNOSIS — D229 Melanocytic nevi, unspecified: Secondary | ICD-10-CM | POA: Insufficient documentation

## 2020-04-18 NOTE — Progress Notes (Signed)
Acute Office Visit  Subjective:    Patient ID: Clayton James, male    DOB: 1950-07-18, 70 y.o.   MRN: [id number]  Chief Complaint  Patient presents with  . skin lesion    R temple; States no symptoms of lesion, been present for at least 2 months.    HPI Patient is in today for growing lesion on right temporal area, it has multiple colors and is irregular. No other symptoms.  Past Medical History:  Diagnosis Date  . Benign essential hypertension 04/27/2019  . Chronic kidney disease, stage 3a ([place]) 04/27/2019  . Mixed hyperlipidemia 04/27/2019  . Type 2 diabetes mellitus with diabetic polyneuropathy ([place]) 04/27/2019  . Type 2 diabetes mellitus with stage 3 chronic kidney disease ([place]) 04/27/2019    Past Surgical History:  Procedure Laterality Date  . ELBOW SURGERY Left 1994  . KNEE ARTHROSCOPY Right 1998  . KNEE CARTILAGE SURGERY Left 1994    History reviewed. No pertinent family history.  Social History   Socioeconomic History  . Marital status: Married    Spouse name: Not on file  . Number of children: Not on file  . Years of education: Not on file  . Highest education level: Not on file  Occupational History  . Not on file  Tobacco Use  . Smoking status: Former Smoker    Years: 8.00    Types: Cigarettes    Quit date: 2000    Years since quitting: 22.2  . Smokeless tobacco: Current User    Types: Chew  . Tobacco comment: 2 can per day  Vaping Use  . Vaping Use: Never used  Substance and Sexual Activity  . Alcohol use: Yes    Alcohol/week: 1.0 standard drink    Types: 1 Cans of beer per week    Comment: one drink per dayd  . Drug use: Never  . Sexual activity: Yes    Partners: Female  Other Topics Concern  . Not on file  Social History Narrative  . Not on file   Social Determinants of Health   Financial Resource Strain: Not on file  Food Insecurity: Not on file  Transportation Needs: Not on file  Physical Activity: Not on file  Stress: Not on file   Social Connections: Not on file  Intimate Partner Violence: Not on file    Outpatient Medications Prior to Visit  Medication Sig Dispense Refill  . ALPRAZolam (XANAX) 0.5 MG tablet Take 1 tablet by mouth twice daily as needed 60 tablet 3  . amLODipine-benazepril (LOTREL) 10-20 MG capsule Take 1 capsule by mouth once daily 90 capsule 2  . atorvastatin (LIPITOR) 40 MG tablet Take 1 tablet by mouth once daily 90 tablet 2  . BD PEN NEEDLE MICRO U/F 32G X 6 MM MISC USE AS DIRECTED ONCE DAILY 100 each 4  . Blood Glucose Monitoring Suppl (ONE TOUCH ULTRA 2) w/Device KIT USE TO CHECK GLUCOSE THREE TIMES DAILY    . Diclofenac Sodium CR 100 MG 24 hr tablet Take 1 tablet by mouth once daily 90 tablet 2  . FARXIGA 10 MG TABS tablet Take 1 tablet by mouth once daily 90 tablet 2  . metFORMIN (GLUCOPHAGE) 1000 MG tablet Take 1 tablet by mouth twice daily 180 tablet 2  . traMADol (ULTRAM) 50 MG tablet TAKE 1 TABLET BY MOUTH EVERY 8 HOURS AS NEEDED FOR MODERATE PAIN 30 tablet 3  . TRESIBA FLEXTOUCH 100 UNIT/ML FlexTouch Pen INJECT 30 UNITS SUBCUTANEOUSLY ONCE DAILY 15 mL 6  No facility-administered medications prior to visit.    Allergies  Allergen Reactions  . Sulfamethoxazole Rash    Review of Systems  Constitutional: Negative.   HENT: Negative.   Eyes: Negative.   Respiratory: Negative for chest tightness and shortness of breath.   Cardiovascular: Negative for chest pain, palpitations and leg swelling.  Gastrointestinal: Negative for abdominal distention and abdominal pain.  Genitourinary: Negative for difficulty urinating.  Musculoskeletal: Negative for arthralgias and back pain.  Skin:       1 by 0.5cm lesion of right temporal area.  Neurological: Negative.   Psychiatric/Behavioral: Negative.        Objective:    Physical Exam Vitals reviewed.  Constitutional:      Appearance: Normal appearance.  HENT:     Right Ear: Tympanic membrane normal.     Left Ear: Tympanic membrane  normal.  Cardiovascular:     Rate and Rhythm: Normal rate and regular rhythm.     Pulses: Normal pulses.     Heart sounds: Normal heart sounds. No murmur heard. No gallop.   Pulmonary:     Effort: Pulmonary effort is normal. No respiratory distress.     Breath sounds: Normal breath sounds. No rales.  Musculoskeletal:        General: Normal range of motion.     Cervical back: Normal range of motion and neck supple.  Skin:    Comments: 1 by 0.5 cm irregular lesion with multivariate colors  Neurological:     Mental Status: He is alert.     BP (!) 150/80 (BP Location: Right Arm, Patient Position: Sitting, Cuff Size: Normal)   Pulse 85   Temp 98.6 F (37 C) (Temporal)   Resp 16   Ht _0  (1.778 m)   Wt 189 lb 6.4 oz (85.9 kg)   SpO2 94%   BMI 27.18 kg/m  Wt Readings from Last 3 Encounters:  04/18/20 189 lb 6.4 oz (85.9 kg)  02/15/20 195 lb (88.5 kg)  12/26/19 195 lb (88.5 kg)    Health Maintenance Due  Topic Date Due  . Hepatitis C Screening  Never done  . OPHTHALMOLOGY EXAM  Never done  . TETANUS/TDAP  Never done  . COLONOSCOPY (Pts 45-72yr Insurance coverage will need to be confirmed)  Never done  . PNA vac Low Risk Adult (1 of 2 - PCV13) Never done  . COVID-19 Vaccine (3 - Pfizer risk 4-dose series) 05/23/2019    There are no preventive care reminders to display for this patient.   No results found for: TSH Lab Results  Component Value Date   WBC 6.7 02/15/2020   HGB 16.2 02/15/2020   HCT 47.3 02/15/2020   MCV 93 02/15/2020   PLT 288 02/15/2020   Lab Results  Component Value Date   NA 145 (H) 02/15/2020   K 4.2 02/15/2020   CO2 22 02/15/2020   GLUCOSE 101 (H) 02/15/2020   BUN 12 02/15/2020   CREATININE 0.74 (L) 02/15/2020   BILITOT 0.5 02/15/2020   ALKPHOS 68 02/15/2020   AST 14 02/15/2020   ALT 17 02/15/2020   PROT 6.8 02/15/2020   ALBUMIN 4.5 02/15/2020   CALCIUM 9.5 02/15/2020   Lab Results  Component Value Date   CHOL 182 02/15/2020    Lab Results  Component Value Date   HDL 75 02/15/2020   Lab Results  Component Value Date   LDLCALC 93 02/15/2020   Lab Results  Component Value Date   TRIG 77 02/15/2020  Lab Results  Component Value Date   CHOLHDL 2.4 02/15/2020   Lab Results  Component Value Date   HGBA1C 6.7 (H) 02/15/2020       Assessment & Plan:  Diagnoses and all orders for this visit: Melanocytic nevus of face, other location -     Ambulatory referral to Dermatology Patient has new multicolored lesion right temporal area that is growing, he needs to get excised.  Refer to dermatology       I spent 15 minutes dedicated to the care of this patient on the date of this encounter to include face-to-face time with the patient, as well as:   Follow-up: Return if symptoms worsen or fail to improve.  An After Visit Summary was printed and given to the patient.  Reinaldo Meeker, MD [facility] [phone]

## 2020-04-19 ENCOUNTER — Ambulatory Visit (INDEPENDENT_AMBULATORY_CARE_PROVIDER_SITE_OTHER): Payer: Medicare HMO | Admitting: Legal Medicine

## 2020-04-19 ENCOUNTER — Encounter: Payer: Self-pay | Admitting: Legal Medicine

## 2020-04-19 ENCOUNTER — Other Ambulatory Visit: Payer: Self-pay

## 2020-04-19 VITALS — BP 136/72 | HR 88 | Temp 97.4°F | Ht 70.0 in | Wt 187.0 lb

## 2020-04-19 DIAGNOSIS — M1711 Unilateral primary osteoarthritis, right knee: Secondary | ICD-10-CM | POA: Diagnosis not present

## 2020-04-19 NOTE — Progress Notes (Signed)
Acute Office Visit  Subjective:    Patient ID: Clayton James, male    DOB: 1950-11-18, 70 y.o.   MRN: [id number]  Chief Complaint  Patient presents with  . Knee Pain    HPI Patient is in today for rt knee pain is a requesting a knee injection. He has chronic osteoarthritis both knees, the right knee is worse and he requests injection  Past Medical History:  Diagnosis Date  . Benign essential hypertension 04/27/2019  . Chronic kidney disease, stage 3a ([place]) 04/27/2019  . Mixed hyperlipidemia 04/27/2019  . Type 2 diabetes mellitus with diabetic polyneuropathy ([place]) 04/27/2019  . Type 2 diabetes mellitus with stage 3 chronic kidney disease ([place]) 04/27/2019    Past Surgical History:  Procedure Laterality Date  . ELBOW SURGERY Left 1994  . KNEE ARTHROSCOPY Right 1998  . KNEE CARTILAGE SURGERY Left 1994    History reviewed. No pertinent family history.  Social History   Socioeconomic History  . Marital status: Married    Spouse name: Not on file  . Number of children: Not on file  . Years of education: Not on file  . Highest education level: Not on file  Occupational History  . Not on file  Tobacco Use  . Smoking status: Former Smoker    Years: 8.00    Types: Cigarettes    Quit date: 2000    Years since quitting: 22.2  . Smokeless tobacco: Current User    Types: Chew  . Tobacco comment: 2 can per day  Vaping Use  . Vaping Use: Never used  Substance and Sexual Activity  . Alcohol use: Yes    Alcohol/week: 1.0 standard drink    Types: 1 Cans of beer per week    Comment: one drink per dayd  . Drug use: Never  . Sexual activity: Yes    Partners: Female  Other Topics Concern  . Not on file  Social History Narrative  . Not on file   Social Determinants of Health   Financial Resource Strain: Not on file  Food Insecurity: Not on file  Transportation Needs: Not on file  Physical Activity: Not on file  Stress: Not on file  Social Connections: Not on file  Intimate  Partner Violence: Not on file    Outpatient Medications Prior to Visit  Medication Sig Dispense Refill  . ALPRAZolam (XANAX) 0.5 MG tablet Take 1 tablet by mouth twice daily as needed 60 tablet 3  . amLODipine-benazepril (LOTREL) 10-20 MG capsule Take 1 capsule by mouth once daily 90 capsule 2  . atorvastatin (LIPITOR) 40 MG tablet Take 1 tablet by mouth once daily 90 tablet 2  . BD PEN NEEDLE MICRO U/F 32G X 6 MM MISC USE AS DIRECTED ONCE DAILY 100 each 4  . Blood Glucose Monitoring Suppl (ONE TOUCH ULTRA 2) w/Device KIT USE TO CHECK GLUCOSE THREE TIMES DAILY    . Diclofenac Sodium CR 100 MG 24 hr tablet Take 1 tablet by mouth once daily 90 tablet 2  . FARXIGA 10 MG TABS tablet Take 1 tablet by mouth once daily 90 tablet 2  . metFORMIN (GLUCOPHAGE) 1000 MG tablet Take 1 tablet by mouth twice daily 180 tablet 2  . traMADol (ULTRAM) 50 MG tablet TAKE 1 TABLET BY MOUTH EVERY 8 HOURS AS NEEDED FOR MODERATE PAIN 30 tablet 3  . TRESIBA FLEXTOUCH 100 UNIT/ML FlexTouch Pen INJECT 30 UNITS SUBCUTANEOUSLY ONCE DAILY 15 mL 6   No facility-administered medications prior to visit.  Allergies  Allergen Reactions  . Sulfamethoxazole Rash    Review of Systems  Constitutional: Negative for chills, diaphoresis, fatigue and fever.  HENT: Negative for congestion, ear pain and sore throat.   Respiratory: Negative for cough and shortness of breath.   Cardiovascular: Negative for chest pain and leg swelling.  Gastrointestinal: Negative.   Genitourinary: Negative.   Musculoskeletal: Positive for arthralgias (Rt knee pain). Negative for myalgias.  Neurological: Negative.   Psychiatric/Behavioral: Negative.        Objective:    Physical Exam Vitals reviewed.  Constitutional:      Appearance: Normal appearance.  Cardiovascular:     Rate and Rhythm: Normal rate and regular rhythm.     Pulses: Normal pulses.     Heart sounds: Normal heart sounds. No murmur heard. No gallop.   Pulmonary:      Effort: Pulmonary effort is normal.     Breath sounds: Normal breath sounds.  Musculoskeletal:        General: Tenderness present.     Cervical back: Normal range of motion and neck supple.     Comments: Right knee crepitation, extends to 10 degrees, negative [place] or Lachman,   Neurological:     General: No focal deficit present.     Mental Status: He is alert and oriented to person, place, and time.     BP 136/72   Pulse 88   Temp (!) 97.4 F (36.3 C)   Ht $R'5\' 10"'lm$  (1.778 m)   Wt 187 lb (84.8 kg)   SpO2 99%   BMI 26.83 kg/m  Wt Readings from Last 3 Encounters:  04/19/20 187 lb (84.8 kg)  04/18/20 189 lb 6.4 oz (85.9 kg)  02/15/20 195 lb (88.5 kg)    Health Maintenance Due  Topic Date Due  . Hepatitis C Screening  Never done  . OPHTHALMOLOGY EXAM  Never done  . TETANUS/TDAP  Never done  . COLONOSCOPY (Pts 45-46yrs Insurance coverage will need to be confirmed)  Never done  . PNA vac Low Risk Adult (1 of 2 - PCV13) Never done  . COVID-19 Vaccine (3 - Pfizer risk 4-dose series) 05/23/2019    There are no preventive care reminders to display for this patient.   No results found for: TSH Lab Results  Component Value Date   WBC 6.7 02/15/2020   HGB 16.2 02/15/2020   HCT 47.3 02/15/2020   MCV 93 02/15/2020   PLT 288 02/15/2020   Lab Results  Component Value Date   NA 145 (H) 02/15/2020   K 4.2 02/15/2020   CO2 22 02/15/2020   GLUCOSE 101 (H) 02/15/2020   BUN 12 02/15/2020   CREATININE 0.74 (L) 02/15/2020   BILITOT 0.5 02/15/2020   ALKPHOS 68 02/15/2020   AST 14 02/15/2020   ALT 17 02/15/2020   PROT 6.8 02/15/2020   ALBUMIN 4.5 02/15/2020   CALCIUM 9.5 02/15/2020   Lab Results  Component Value Date   CHOL 182 02/15/2020   Lab Results  Component Value Date   HDL 75 02/15/2020   Lab Results  Component Value Date   LDLCALC 93 02/15/2020   Lab Results  Component Value Date   TRIG 77 02/15/2020   Lab Results  Component Value Date   CHOLHDL 2.4  02/15/2020   Lab Results  Component Value Date   HGBA1C 6.7 (H) 02/15/2020       Assessment & Plan:  1. Primary osteoarthritis of right knee I injected right knee   After  consent was obtained, using sterile technique the right knee was prepped and ethyl Chloride was used as local anesthetic. The joint was entered and   Steroid 80 mg and 3 ml plain Lidocaine was then injected and the needle withdrawn.  The procedure was well tolerated.  The patient is asked to continue to rest the joint for a few more days before resuming regular activities.  It may be more painful for the first 1-2 days.  Watch for fever, or increased swelling or persistent pain in the joint. Call or return to clinic prn if such symptoms occur or there is failure to improve as anticipated.       I spent 20 minutes dedicated to the care of this patient on the date of this encounter to include face-to-face time with the patient, as well as:   Follow-up: Return if symptoms worsen or fail to improve.  An After Visit Summary was printed and given to the patient.  Reinaldo Meeker, MD [facility] [phone]

## 2020-04-22 ENCOUNTER — Other Ambulatory Visit: Payer: Self-pay | Admitting: Legal Medicine

## 2020-04-24 ENCOUNTER — Ambulatory Visit (INDEPENDENT_AMBULATORY_CARE_PROVIDER_SITE_OTHER): Payer: Medicare HMO

## 2020-04-24 VITALS — BP 148/74 | HR 80 | Temp 97.8°F | Resp 16 | Ht 70.0 in | Wt 187.6 lb

## 2020-04-24 DIAGNOSIS — Z0000 Encounter for general adult medical examination without abnormal findings: Secondary | ICD-10-CM

## 2020-04-24 DIAGNOSIS — Z23 Encounter for immunization: Secondary | ICD-10-CM

## 2020-04-24 DIAGNOSIS — Z6826 Body mass index (BMI) 26.0-26.9, adult: Secondary | ICD-10-CM

## 2020-04-24 DIAGNOSIS — R69 Illness, unspecified: Secondary | ICD-10-CM | POA: Diagnosis not present

## 2020-04-24 DIAGNOSIS — F17200 Nicotine dependence, unspecified, uncomplicated: Secondary | ICD-10-CM

## 2020-04-24 DIAGNOSIS — Z1211 Encounter for screening for malignant neoplasm of colon: Secondary | ICD-10-CM | POA: Diagnosis not present

## 2020-04-24 NOTE — Patient Instructions (Signed)
Screening for Type 2 Diabetes  A screening test for type 2 diabetes (type 2 diabetes mellitus) is a blood test to measure your blood sugar (glucose) level. This test is done to check for early signs of diabetes, before you develop symptoms.  Type 2 diabetes is a long-term (chronic) disease. In type 2 diabetes, one or both of these problems may be present:  The pancreas does not make enough of a hormone called insulin.  Cells in the body do not respond properly to insulin that the body makes (insulin resistance). Normally, insulin allows blood sugar (glucose) to enter cells in the body. The cells use glucose for energy. Insulin resistance or lack of insulin causes excess glucose to build up in the blood instead of going into cells. This results in high blood glucose levels (hyperglycemia), which can cause many complications. You may be screened for type 2 diabetes as part of your regular health care, especially if you have a high risk for diabetes. Screening can help to identify type 2 diabetes at its early stage (prediabetes). Identifying and treating prediabetes may delay or prevent the development of type 2 diabetes. What are the risk factors for type 2 diabetes? The following factors may make you more likely to develop type 2 diabetes:  Having a parent or sibling (first-degree relative) who has diabetes.  Being overweight or obese.  Being of American-Indian, African-American, Hispanic, Latino, Asian, or [employer] descent.  Not getting enough exercise (having a sedentary lifestyle).  Being older than age 64.  Having a history of diabetes during pregnancy (gestational diabetes).  Having low levels of good cholesterol (HDL-C) or high levels of blood fats (triglycerides).  Having high blood glucose in a previous blood test.  Having high blood pressure.  Having certain diseases or conditions that may be caused by insulin resistance, including: ? Acanthosis nigricans. This is  a condition that causes dark skin on the neck, armpits, and groin. ? Polycystic ovary syndrome (PCOS). ? Cardiovascular heart disease. Who should be screened for type 2 diabetes? Adults  Adults age 45 and older. These adults should be screened once every three years.  Adults who are younger than age 70, are overweight, and have one other risk factor. These adults should be screened once every three years.  Adults who have normal blood glucose levels and two or more risk factors. These adults may be screened once every year (annually).  Women who have had gestational diabetes in the past. These women should be screened once every three years.  Pregnant women who have risk factors. These women should be screened at their first prenatal visit and again between weeks 24 and 28 of pregnancy. Children and adolescents  Children and adolescents should be screened for type 2 diabetes if they are overweight and have any of the following risk factors: ? A family history of type 2 diabetes. ? Being a member of a high-risk ethnic group. ? Signs of insulin resistance or conditions that are associated with insulin resistance. ? A mother who had gestational diabetes while pregnant with him or her.  Screening should be done at least once every three years, starting at age 22 or at the onset of puberty, whichever comes first. Your health care provider or your child's health care provider may recommend having a screening more or less often. What happens during screening? During screening, your health care provider may ask questions about:  Your health and your risk factors, including your activity level and any medical  conditions that you have.  The health of your first-degree relatives.  Past pregnancies, if this applies. Your health care provider will also do a physical exam, including a blood pressure measurement and blood tests. There are four blood tests that can be used to screen for type 2  diabetes. You may have one or more of the following:  A fasting blood glucose (FBG) test. You will not be allowed to eat (you will fast) for 8 hours or more before a blood sample is taken.  A random blood glucose test. This test checks your blood glucose at any time of the day regardless of when you ate.  An oral glucose tolerance test (OGTT). This test measures your blood glucose at two times: ? After you have not eaten (have fasted) overnight. This is your baseline glucose level. ? Two hours after you drink a glucose-containing beverage.  An A1c (hemoglobin A1c) blood test. This test provides information about blood glucose control over the previous 2-3 months.   What do the results mean? Your test results are a measurement of how much glucose is in your blood. Normal blood glucose levels mean that you do not have diabetes or prediabetes. High blood glucose levels may mean that you have prediabetes or diabetes. Depending on the results, other tests may be needed to confirm the diagnosis. You may be diagnosed with type 2 diabetes if:  Your FBG level is 126 mg/dL (7.0 mmol/L) or higher.  Your random blood glucose level is 200 mg/dL (11.1 mmol/L) or higher.  Your A1c level is 6.5% or higher.  Your OGTT result is higher than 200 mg/dL (11.1 mmol/L). These blood tests may be repeated to confirm your diagnosis. Talk with your health care provider about what your results mean. Summary  A screening test for type 2 diabetes (type 2 diabetes mellitus) is a blood test to measure your blood sugar (glucose) level.  Know what your risk factors are for developing type 2 diabetes.  If you are at risk, get screening tests as often as told by your health care provider.  Screening may help you identify type 2 diabetes at its early stage (prediabetes). Identifying and treating prediabetes may delay or prevent the development of type 2 diabetes. This information is not intended to replace advice given  to you by your health care provider. Make sure you discuss any questions you have with your health care provider. Document Revised: 08/02/2019 Document Reviewed: 08/02/2019 Elsevier Patient Education  2021 [place].   Diabetes Mellitus and [place] care is an important part of your health, especially when you have diabetes. Diabetes may cause you to have problems because of poor blood flow (circulation) to your feet and legs, which can cause your skin to:  Become thinner and drier.  Break more easily.  Heal more slowly.  Peel and crack. You may also have nerve damage (neuropathy) in your legs and feet, causing decreased feeling in them. This means that you may not notice minor injuries to your feet that could lead to more serious problems. Noticing and addressing any potential problems early is the best way to prevent future foot problems. How to care for your feet Foot hygiene  Wash your feet daily with warm water and mild soap. Do not use hot water. Then, pat your feet and the areas between your toes until they are completely dry. Do not soak your feet as this can dry your skin.  Trim your toenails straight across. Do not  dig under them or around the cuticle. File the edges of your nails with an emery board or nail file.  Apply a moisturizing lotion or petroleum jelly to the skin on your feet and to dry, brittle toenails. Use lotion that does not contain alcohol and is unscented. Do not apply lotion between your toes.   Shoes and socks  Wear clean socks or stockings every day. Make sure they are not too tight. Do not wear knee-high stockings since they may decrease blood flow to your legs.  Wear shoes that fit properly and have enough cushioning. Always look in your shoes before you put them on to be sure there are no objects inside.  To break in new shoes, wear them for just a few hours a day. This prevents injuries on your feet. Wounds, scrapes, corns, and  calluses  Check your feet daily for blisters, cuts, bruises, sores, and redness. If you cannot see the bottom of your feet, use a mirror or ask someone for help.  Do not cut corns or calluses or try to remove them with medicine.  If you find a minor scrape, cut, or break in the skin on your feet, keep it and the skin around it clean and dry. You may clean these areas with mild soap and water. Do not clean the area with peroxide, alcohol, or iodine.  If you have a wound, scrape, corn, or callus on your foot, look at it several times a day to make sure it is healing and not infected. Check for: ? Redness, swelling, or pain. ? Fluid or blood. ? Warmth. ? Pus or a bad smell.   General tips  Do not cross your legs. This may decrease blood flow to your feet.  Do not use heating pads or hot water bottles on your feet. They may burn your skin. If you have lost feeling in your feet or legs, you may not know this is happening until it is too late.  Protect your feet from hot and cold by wearing shoes, such as at the beach or on hot pavement.  Schedule a complete foot exam at least once a year (annually) or more often if you have foot problems. Report any cuts, sores, or bruises to your health care provider immediately. Where to find more information  American Diabetes Association: www.diabetes.org  Association of Diabetes Care & Education Specialists: www.diabeteseducator.org Contact a health care provider if:  You have a medical condition that increases your risk of infection and you have any cuts, sores, or bruises on your feet.  You have an injury that is not healing.  You have redness on your legs or feet.  You feel burning or tingling in your legs or feet.  You have pain or cramps in your legs and feet.  Your legs or feet are numb.  Your feet always feel cold.  You have pain around any toenails. Get help right away if:  You have a wound, scrape, corn, or callus on your foot  and: ? You have pain, swelling, or redness that gets worse. ? You have fluid or blood coming from the wound, scrape, corn, or callus. ? Your wound, scrape, corn, or callus feels warm to the touch. ? You have pus or a bad smell coming from the wound, scrape, corn, or callus. ? You have a fever. ? You have a red line going up your leg. Summary  Check your feet every day for blisters, cuts, bruises, sores, and redness.  Apply a moisturizing lotion or petroleum jelly to the skin on your feet and to dry, brittle toenails.  Wear shoes that fit properly and have enough cushioning.  If you have foot problems, report any cuts, sores, or bruises to your health care provider immediately.  Schedule a complete foot exam at least once a year (annually) or more often if you have foot problems. This information is not intended to replace advice given to you by your health care provider. Make sure you discuss any questions you have with your health care provider. Document Revised: 07/27/2019 Document Reviewed: 07/27/2019 Elsevier Patient Education  2021 [place] Prevention in the Home, Adult Falls can cause injuries and can happen to people of all ages. There are many things you can do to make your home safe and to help prevent falls. Ask for help when making these changes. What actions can I take to prevent falls? General Instructions  Use good lighting in all rooms. Replace any light bulbs that burn out.  Turn on the lights in dark areas. Use night-lights.  Keep items that you use often in easy-to-reach places. Lower the shelves around your home if needed.  Set up your furniture so you have a clear path. Avoid moving your furniture around.  Do not have throw rugs or other things on the floor that can make you trip.  Avoid walking on wet floors.  If any of your floors are uneven, fix them.  Add color or contrast paint or tape to clearly mark and help you see: ? Grab bars or  handrails. ? First and last steps of staircases. ? Where the edge of each step is.  If you use a stepladder: ? Make sure that it is fully opened. Do not climb a closed stepladder. ? Make sure the sides of the stepladder are locked in place. ? Ask someone to hold the stepladder while you use it.  Know where your pets are when moving through your home. What can I do in the bathroom?  Keep the floor dry. Clean up any water on the floor right away.  Remove soap buildup in the tub or shower.  Use nonskid mats or decals on the floor of the tub or shower.  Attach bath mats securely with double-sided, nonslip rug tape.  If you need to sit down in the shower, use a plastic, nonslip stool.  Install grab bars by the toilet and in the tub and shower. Do not use towel bars as grab bars.      What can I do in the bedroom?  Make sure that you have a light by your bed that is easy to reach.  Do not use any sheets or blankets for your bed that hang to the floor.  Have a firm chair with side arms that you can use for support when you get dressed. What can I do in the kitchen?  Clean up any spills right away.  If you need to reach something above you, use a step stool with a grab bar.  Keep electrical cords out of the way.  Do not use floor polish or wax that makes floors slippery. What can I do with my stairs?  Do not leave any items on the stairs.  Make sure that you have a light switch at the top and the bottom of the stairs.  Make sure that there are handrails on both sides of the stairs. Fix handrails that are broken or loose.  Install nonslip stair treads on all your stairs.  Avoid having throw rugs at the top or bottom of the stairs.  Choose a carpet that does not hide the edge of the steps on the stairs.  Check carpeting to make sure that it is firmly attached to the stairs. Fix carpet that is loose or worn. What can I do on the outside of my home?  Use bright outdoor  lighting.  Fix the edges of walkways and driveways and fix any cracks.  Remove anything that might make you trip as you walk through a door, such as a raised step or threshold.  Trim any bushes or trees on paths to your home.  Check to see if handrails are loose or broken and that both sides of all steps have handrails.  Install guardrails along the edges of any raised decks and porches.  Clear paths of anything that can make you trip, such as tools or rocks.  Have leaves, snow, or ice cleared regularly.  Use sand or salt on paths during winter.  Clean up any spills in your garage right away. This includes grease or oil spills. What other actions can I take?  Wear shoes that: ? Have a low heel. Do not wear high heels. ? Have rubber bottoms. ? Feel good on your feet and fit well. ? Are closed at the toe. Do not wear open-toe sandals.  Use tools that help you move around if needed. These include: ? Canes. ? Walkers. ? Scooters. ? Crutches.  Review your medicines with your doctor. Some medicines can make you feel dizzy. This can increase your chance of falling. Ask your doctor what else you can do to help prevent falls. Where to find more information  Centers for Disease Control and Prevention, [employer]: http://www.wolf.info/  National Institute on Aging: http://kim-miller.com/ Contact a doctor if:  You are afraid of falling at home.  You feel weak, drowsy, or dizzy at home.  You fall at home. Summary  There are many simple things that you can do to make your home safe and to help prevent falls.  Ways to make your home safe include removing things that can make you trip and installing grab bars in the bathroom.  Ask for help when making these changes in your home. This information is not intended to replace advice given to you by your health care provider. Make sure you discuss any questions you have with your health care provider. Document Revised: 08/09/2019 Document Reviewed:  08/09/2019 Elsevier Patient Education  Ray Maintenance, Male Adopting a healthy lifestyle and getting preventive care are important in promoting health and wellness. Ask your health care provider about:  The right schedule for you to have regular tests and exams.  Things you can do on your own to prevent diseases and keep yourself healthy. What should I know about diet, weight, and exercise? Eat a healthy diet  Eat a diet that includes plenty of vegetables, fruits, low-fat dairy products, and lean protein.  Do not eat a lot of foods that are high in solid fats, added sugars, or sodium.   Maintain a healthy weight Body mass index (BMI) is a measurement that can be used to identify possible weight problems. It estimates body fat based on height and weight. Your health care provider can help determine your BMI and help you achieve or maintain a healthy weight. Get regular exercise Get regular exercise. This is one of the most important things  you can do for your health. Most adults should:  Exercise for at least 150 minutes each week. The exercise should increase your heart rate and make you sweat (moderate-intensity exercise).  Do strengthening exercises at least twice a week. This is in addition to the moderate-intensity exercise.  Spend less time sitting. Even light physical activity can be beneficial. Watch cholesterol and blood lipids Have your blood tested for lipids and cholesterol at 70 years of age, then have this test every 5 years. You may need to have your cholesterol levels checked more often if:  Your lipid or cholesterol levels are high.  You are older than 70 years of age.  You are at high risk for heart disease. What should I know about cancer screening? Many types of cancers can be detected early and may often be prevented. Depending on your health history and family history, you may need to have cancer screening at various ages. This may  include screening for:  Colorectal cancer.  Prostate cancer.  Skin cancer.  Lung cancer. What should I know about heart disease, diabetes, and high blood pressure? Blood pressure and heart disease  High blood pressure causes heart disease and increases the risk of stroke. This is more likely to develop in people who have high blood pressure readings, are of African descent, or are overweight.  Talk with your health care provider about your target blood pressure readings.  Have your blood pressure checked: ? Every 3-5 years if you are 35-65 years of age. ? Every year if you are 62 years old or older.  If you are between the ages of 74 and 28 and are a current or former smoker, ask your health care provider if you should have a one-time screening for abdominal aortic aneurysm (AAA). Diabetes Have regular diabetes screenings. This checks your fasting blood sugar level. Have the screening done:  Once every three years after age 42 if you are at a normal weight and have a low risk for diabetes.  More often and at a younger age if you are overweight or have a high risk for diabetes. What should I know about preventing infection? Hepatitis B If you have a higher risk for hepatitis B, you should be screened for this virus. Talk with your health care provider to find out if you are at risk for hepatitis B infection. Hepatitis C Blood testing is recommended for:  Everyone born from [birth year] through 1965.  Anyone with known risk factors for hepatitis C. Sexually transmitted infections (STIs)  You should be screened each year for STIs, including gonorrhea and chlamydia, if: ? You are sexually active and are younger than 70 years of age. ? You are older than 70 years of age and your health care provider tells you that you are at risk for this type of infection. ? Your sexual activity has changed since you were last screened, and you are at increased risk for chlamydia or gonorrhea. Ask your  health care provider if you are at risk.  Ask your health care provider about whether you are at high risk for HIV. Your health care provider may recommend a prescription medicine to help prevent HIV infection. If you choose to take medicine to prevent HIV, you should first get tested for HIV. You should then be tested every 3 months for as long as you are taking the medicine. Follow these instructions at home: Lifestyle  Do not use any products that contain nicotine or tobacco, such as cigarettes,  e-cigarettes, and chewing tobacco. If you need help quitting, ask your health care provider.  Do not use street drugs.  Do not share needles.  Ask your health care provider for help if you need support or information about quitting drugs. Alcohol use  Do not drink alcohol if your health care provider tells you not to drink.  If you drink alcohol: ? Limit how much you have to 0-2 drinks a day. ? Be aware of how much alcohol is in your drink. In the [place] one drink equals one 12 oz bottle of beer (355 mL), one 5 oz glass of wine (148 mL), or one 1 oz glass of hard liquor (44 mL). General instructions  Schedule regular health, dental, and eye exams.  Stay current with your vaccines.  Tell your health care provider if: ? You often feel depressed. ? You have ever been abused or do not feel safe at home. Summary  Adopting a healthy lifestyle and getting preventive care are important in promoting health and wellness.  Follow your health care provider's instructions about healthy diet, exercising, and getting tested or screened for diseases.  Follow your health care provider's instructions on monitoring your cholesterol and blood pressure. This information is not intended to replace advice given to you by your health care provider. Make sure you discuss any questions you have with your health care provider. Document Revised: 12/29/2017 Document Reviewed: 12/29/2017 Elsevier Patient Education   2021 [employer].

## 2020-04-24 NOTE — Progress Notes (Signed)
Subjective:   Clayton James is a 70 y.o. male who presents for Medicare Annual/Subsequent preventive examination.  This wellness visit is conducted by a nurse.  The patient's medications were reviewed and reconciled since the patient's last visit.  History details were provided by the patient.  The history appears to be reliable.    Patient's last AWV was two years ago.   Medical History: Patient history and Family history was reviewed  Medications, Allergies, and preventative health maintenance was reviewed and updated.   Review of Systems    Review of Systems  Constitutional: Negative.   HENT: Negative.   Eyes: Negative.   Respiratory: Negative.  Negative for cough, chest tightness, shortness of breath and wheezing.   Cardiovascular: Negative.  Negative for chest pain and palpitations.  Gastrointestinal: Negative.   Genitourinary: Negative.   Musculoskeletal: Positive for arthralgias.       Bilateral knee pain  Neurological: Negative.  Negative for dizziness, weakness, numbness and headaches.  Psychiatric/Behavioral: Negative.  Negative for confusion, decreased concentration, dysphoric mood and suicidal ideas. The patient is not nervous/anxious.    Cardiac Risk Factors include: advanced age (>55mn, >>52women);diabetes mellitus;dyslipidemia;hypertension;male gender;smoking/ tobacco exposure     Objective:     04/24/20 1049  BP: (!) 148/74  Pulse: 80  Resp: 16  Temp: 97.8 F (36.6 C)  SpO2: 95%  Weight: 187 lb 9.6 oz (85.1 kg)  Height: 5' 10"  (1.778 m)  PainSc: 2   PainLoc: Knee   Body mass index is 26.92 kg/m.  Advanced Directives 04/24/2020  Does Patient Have a Medical Advance Directive? Yes  Type of Advance Directive HHarding Does patient want to make changes to medical advance directive? No - Patient declined  Copy of HClif[place]in Chart? No - copy requested    Current Medications (verified) Outpatient Encounter  Medications as of 04/24/2020  Medication Sig  . ALPRAZolam (XANAX) 0.5 MG tablet Take 1 tablet by mouth twice daily as needed  . amLODipine-benazepril (LOTREL) 10-20 MG capsule Take 1 capsule by mouth once daily  . atorvastatin (LIPITOR) 40 MG tablet Take 1 tablet by mouth once daily  . BD PEN NEEDLE MICRO U/F 32G X 6 MM MISC USE AS DIRECTED ONCE DAILY  . Blood Glucose Monitoring Suppl (ONE TOUCH ULTRA 2) w/Device KIT USE TO CHECK GLUCOSE THREE TIMES DAILY  . Diclofenac Sodium CR 100 MG 24 hr tablet Take 1 tablet by mouth once daily  . FARXIGA 10 MG TABS tablet Take 1 tablet by mouth once daily  . metFORMIN (GLUCOPHAGE) 1000 MG tablet Take 1 tablet by mouth twice daily  . traMADol (ULTRAM) 50 MG tablet TAKE 1 TABLET BY MOUTH EVERY 8 HOURS AS NEEDED FOR MODERATE PAIN  . TRESIBA FLEXTOUCH 100 UNIT/ML FlexTouch Pen INJECT 30 UNITS SUBCUTANEOUSLY ONCE DAILY   No facility-administered encounter medications on file as of 04/24/2020.    Allergies (verified) Sulfamethoxazole   History: Past Medical History:  Diagnosis Date  . Benign essential hypertension 04/27/2019  . Chronic [place] disease, [place] 3a ([facility] 04/27/[place]  . Mixed hyperlipidemia 04/27/2019  . Type 2 diabetes mellitus with diabetic polyneuropathy ([facility] 04/27/2019  . Type 2 diabetes mellitus with stage 3 chronic kidney disease ([facility] 04/27/2019   Past Surgical History:  Procedure Laterality Date  . ELBOW SURGERY Left 1994  . KNEE ARTHROSCOPY Right 1998  . KNEE CARTILAGE SURGERY Left 1994   Family History  Problem Relation Age of Onset  . Diabetes Mother   .  Kidney disease Mother   . Heart disease Father    Social History   Socioeconomic History  . Marital status: Married    Spouse name: Not on file  . Number of children: 5  Tobacco Use  . Smoking status: Former Smoker    Years: 8.00    Types: Cigarettes    Quit date: 2000    Years since quitting: 22.2  . Smokeless tobacco: Current User    Types: Chew  . Tobacco comment: 1  can per week  Substance and Sexual Activity  . Alcohol use: Yes    Alcohol/week: 1.0 standard drink    Types: 1 Cans of beer per week    Comment: one drink per dayd  . Drug use: Never  . Sexual activity: Yes    Partners: Female   Social Determinants of Radio broadcast assistant Strain: Not on file  Food Insecurity: No Food Insecurity  . Worried About Charity fundraiser in the Last Year: Never true  . Ran Out of Food in the Last Year: Never true  Transportation Needs: No Transportation Needs  . Lack of Transportation (Medical): No  . Lack of Transportation (Non-Medical): No  Physical Activity: Not on file  Stress: Not on file  Social Connections: Not on file    Tobacco Counseling Ready to quit: No Counseling given: Yes Comment: 1 can per week   Clinical Intake:  Pre-visit preparation completed: Yes  Pain : 0-10 Pain Score: 1  Pain Type: Chronic pain Pain Location: Knee Pain Orientation: Right,Left Pain Descriptors / Indicators: Aching Pain Onset: More than a month ago Pain Frequency: Intermittent Effect of Pain on Daily Activities: minimal     BMI - recorded: 26.92 Nutritional Status: BMI 25 -29 Overweight Nutritional Risks: None Diabetes: Yes CBG done?: No Did pt. bring in CBG monitor from home?: No (patient reports fasting glucose in 70-90 range)  How often do you need to have someone help you when you read instructions, pamphlets, or other written materials from your doctor or pharmacy?: 1 - Never  Interpreter Needed?: No   Activities of Daily Living In your present state of health, do you have any difficulty performing the following activities: 04/24/2020 02/15/2020  Hearing? N N  Vision? N N  Difficulty concentrating or making decisions? N N  Walking or climbing stairs? Y Y  Comment sometimes bothered with knee pain -  Dressing or bathing? N N  Doing errands, shopping? N N  Preparing Food and eating ? N -  Using the Toilet? N -  In the past six  months, have you accidently leaked urine? N -  Do you have problems with loss of bowel control? N -  Managing your Medications? N -  Managing your Finances? N -  Housekeeping or managing your Housekeeping? N -  Some recent data might be hidden    Patient Care Team: Lillard Anes, MD as PCP - General (Family Medicine)  Indicate any recent Medical Services you may have received from other than [facility] providers in the past year (date may be approximate).     Assessment:   This is a routine wellness examination for Gerrell.  Vision screen [facility] exam with [facility]  Dietary issues and exercise activities discussed: Current Exercise Habits: The patient does not participate in regular exercise at present, Exercise limited by: orthopedic condition(s)   Depression Screen PHQ 2/9 Scores 04/24/2020 05/17/2019 04/27/2019  PHQ - 2 Score 0 0 0    Fall  Risk Fall Risk  04/24/2020 08/14/2019 05/17/2019  Falls in the past year? 0 0 -  Number falls in past yr: 0 0 0  Injury with Fall? 0 0 0  Risk for fall due to : No Fall Risks - -  Follow up Falls evaluation completed;Falls prevention discussed;Education provided Falls evaluation completed Falls evaluation completed    FALL RISK PREVENTION PERTAINING TO THE HOME:  Any stairs in or around the home? Yes  If so, are there any without handrails? No  Home free of loose throw rugs in walkways, pet beds, electrical cords, etc? Yes  Adequate lighting in your home to reduce risk of falls? Yes   ASSISTIVE DEVICES UTILIZED TO PREVENT FALLS:  Life alert? No  Use of a cane, walker or w/c? No  Grab bars in the bathroom? No  Shower chair or bench in shower? No  Elevated toilet seat or a handicapped toilet? No   Gait steady and fast without use of assistive device  Cognitive Function:     6CIT Screen 04/24/2020  What Year? 0 points  What month? 0 points  What time? 0 points  Count back from 20 0 points  Months in reverse 0 points  Repeat  phrase 0 points  Total Score 0    Immunizations Immunization History  Administered Date(s) Administered  . Influenza-Unspecified 11/16/2018  . PFIZER Comirnaty(Gray Top)Covid-19 Tri-Sucrose Vaccine 03/30/2019, 04/25/2019    TDAP status: Due, Education has been provided regarding the importance of this vaccine. Advised may receive this vaccine at local pharmacy or Health Dept. Aware to provide a copy of the vaccination record if obtained from local pharmacy or Health Dept. Verbalized acceptance and understanding.  Flu Vaccine status: Declined, Education has been provided regarding the importance of this vaccine but patient still declined. Advised may receive this vaccine at local pharmacy or Health Dept. Aware to provide a copy of the vaccination record if obtained from local pharmacy or Health Dept. Verbalized acceptance and understanding.  Pneumococcal vaccine status: Completed during today's visit.  Covid-19 vaccine status: Completed vaccines  Screening Tests Health Maintenance  Topic Date Due  . OPHTHALMOLOGY EXAM  Due  . TETANUS/TDAP  Never done  . COLONOSCOPY (Pts 45-29yr Insurance coverage will need to be confirmed)  Never done  . PNA vac Low Risk Adult (1 of 2 - PCV13) Never done  . COVID-19 Vaccine (3 - Pfizer risk 4-dose series) 05/23/2019  . HEMOGLOBIN A1C  08/14/2020  . INFLUENZA VACCINE  08/19/2020  . FOOT EXAM  02/14/2021  . HPV VACCINES  Aged Out    Health Maintenance  Health Maintenance Due  Topic Date Due  . OPHTHALMOLOGY EXAM  Never done  . TETANUS/TDAP  Never done  . COLONOSCOPY (Pts 45-453yrInsurance coverage will need to be confirmed)  Never done  . PNA vac Low Risk Adult (1 of 2 - PCV13) Never done  . COVID-19 Vaccine (3 - Pfizer risk 4-dose series) 05/23/2019    Colorectal cancer screening: Cologuard ordered  Additional Screening:  Vision Screening: Recommended annual ophthalmology exams for early detection of glaucoma and other disorders of the  eye. Is the patient up to date with their annual eye exam?  No  Who is the provider or what is the name of the office in which the patient attends annual eye exams[facility]: Recommended annual dental exams for proper oral hygiene    Plan:     1- [place], patient will call to schedule yearly exam 2- Cologuard ordered 3- PNA vaccine given in office 4- Patient will exercise daily as tolerated 5- Patient assistance forms started for [place] and [employer] - he will come back in the office to sign forms and bring proof of income  I have personally reviewed and noted the following in the patient's chart:   . Medical and social history . Use of alcohol, tobacco or illicit drugs  . Current medications and supplements . Functional ability and status . Nutritional status . Physical activity . Advanced directives . List of other physicians . Hospitalizations, surgeries, and ER visits in previous 12 months . Vitals . Screenings to include cognitive, depression, and falls . Referrals and appointments  In addition, I have reviewed and discussed with patient certain preventive protocols, quality metrics, and best practice recommendations. A written personalized care plan for preventive services as well as general preventive health recommendations were provided to patient.     Erie Noe, LPN   06/23/3835

## 2020-04-28 ENCOUNTER — Other Ambulatory Visit: Payer: Self-pay | Admitting: Legal Medicine

## 2020-04-28 DIAGNOSIS — M1712 Unilateral primary osteoarthritis, left knee: Secondary | ICD-10-CM

## 2020-04-29 ENCOUNTER — Encounter: Payer: Self-pay | Admitting: Legal Medicine

## 2020-05-03 ENCOUNTER — Encounter: Payer: Self-pay | Admitting: Legal Medicine

## 2020-05-03 ENCOUNTER — Ambulatory Visit (INDEPENDENT_AMBULATORY_CARE_PROVIDER_SITE_OTHER): Payer: Medicare HMO | Admitting: Legal Medicine

## 2020-05-03 ENCOUNTER — Other Ambulatory Visit: Payer: Self-pay

## 2020-05-03 VITALS — BP 142/80 | HR 82 | Temp 97.5°F | Ht 70.0 in | Wt 186.0 lb

## 2020-05-03 DIAGNOSIS — M1712 Unilateral primary osteoarthritis, left knee: Secondary | ICD-10-CM

## 2020-05-03 DIAGNOSIS — L570 Actinic keratosis: Secondary | ICD-10-CM | POA: Diagnosis not present

## 2020-05-03 NOTE — Progress Notes (Signed)
Acute Office Visit  Subjective:    Patient ID: Clayton James, male    DOB: [birth year], 70 y.o.   MRN: [id number]  Chief Complaint  Patient presents with  . Left Knee pain    HPI Patient is in today for left knee pain requesting left knee injection.  Past Medical History:  Diagnosis Date  . Benign essential hypertension 04/27/2019  . Chronic kidney disease, stage 3a ([place]) 04/27/2019  . Mixed hyperlipidemia 04/27/2019  . Type 2 diabetes mellitus with diabetic polyneuropathy ([place]) 04/27/2019  . Type 2 diabetes mellitus with stage 3 chronic kidney disease ([place]) 04/27/2019    Past Surgical History:  Procedure Laterality Date  . ELBOW SURGERY Left 1994  . KNEE ARTHROSCOPY Right 1998  . KNEE CARTILAGE SURGERY Left 1994    Family History  Problem Relation Age of Onset  . Diabetes Mother   . Kidney disease Mother   . Heart disease Father     Social History   Socioeconomic History  . Marital status: Married    Spouse name: Not on file  . Number of children: 5  . Years of education: Not on file  . Highest education level: Not on file  Occupational History  . Not on file  Tobacco Use  . Smoking status: Former Smoker    Years: 8.00    Types: Cigarettes    Quit date: 2000    Years since quitting: 22.3  . Smokeless tobacco: Current User    Types: Chew  . Tobacco comment: 1 can per week  Vaping Use  . Vaping Use: Never used  Substance and Sexual Activity  . Alcohol use: Yes    Alcohol/week: 1.0 standard drink    Types: 1 Cans of beer per week    Comment: one drink per dayd  . Drug use: Never  . Sexual activity: Yes    Partners: Female  Other Topics Concern  . Not on file  Social History Narrative  . Not on file   Social Determinants of Health   Financial Resource Strain: Not on file  Food Insecurity: No Food Insecurity  . Worried About Charity fundraiser in the Last Year: Never true  . Ran Out of Food in the Last Year: Never true  Transportation Needs: No  Transportation Needs  . Lack of Transportation (Medical): No  . Lack of Transportation (Non-Medical): No  Physical Activity: Not on file  Stress: Not on file  Social Connections: Not on file  Intimate Partner Violence: Not At Risk  . Fear of Current or Ex-Partner: No  . Emotionally Abused: No  . Physically Abused: No  . Sexually Abused: No    Outpatient Medications Prior to Visit  Medication Sig Dispense Refill  . ALPRAZolam (XANAX) 0.5 MG tablet Take 1 tablet by mouth twice daily as needed 60 tablet 3  . amLODipine-benazepril (LOTREL) 10-20 MG capsule Take 1 capsule by mouth once daily 90 capsule 2  . atorvastatin (LIPITOR) 40 MG tablet Take 1 tablet by mouth once daily 90 tablet 2  . BD PEN NEEDLE MICRO U/F 32G X 6 MM MISC USE AS DIRECTED ONCE DAILY 100 each 4  . Blood Glucose Monitoring Suppl (ONE TOUCH ULTRA 2) w/Device KIT USE TO CHECK GLUCOSE THREE TIMES DAILY    . Diclofenac Sodium CR 100 MG 24 hr tablet Take 1 tablet by mouth once daily 90 tablet 2  . FARXIGA 10 MG TABS tablet Take 1 tablet by mouth once daily 90 tablet 2  .  metFORMIN (GLUCOPHAGE) 1000 MG tablet Take 1 tablet by mouth twice daily 180 tablet 2  . traMADol (ULTRAM) 50 MG tablet TAKE 1 TABLET BY MOUTH EVERY 8 HOURS AS NEEDED FOR MODERATE PAIN 30 tablet 3  . TRESIBA FLEXTOUCH 100 UNIT/ML FlexTouch Pen INJECT 30 UNITS SUBCUTANEOUSLY ONCE DAILY 15 mL 6   No facility-administered medications prior to visit.    Allergies  Allergen Reactions  . Sulfamethoxazole Rash    Review of Systems  Constitutional: Negative for chills, diaphoresis, fatigue and fever.  HENT: Negative for congestion, ear pain and sore throat.   Respiratory: Negative for cough and shortness of breath.   Cardiovascular: Negative for chest pain and leg swelling.  Genitourinary: Negative for dysuria and urgency.  Musculoskeletal: Positive for arthralgias. Negative for myalgias.  Psychiatric/Behavioral: Negative.        Objective:     Physical Exam Vitals reviewed.  Constitutional:      Appearance: Normal appearance.  Cardiovascular:     Rate and Rhythm: Normal rate and regular rhythm.     Pulses: Normal pulses.     Heart sounds: Normal heart sounds. No murmur heard.   Pulmonary:     Effort: Pulmonary effort is normal.     Breath sounds: Normal breath sounds. No rales.  Musculoskeletal:        General: Tenderness (left knee) present.     Comments: Right knee osteoarthritis  Skin:    Capillary Refill: Capillary refill takes less than 2 seconds.     Comments: Scaling lesion right temporal area, needs removing  Neurological:     Mental Status: He is alert.     BP (!) 142/80   Pulse 82   Temp (!) 97.5 F (36.4 C)   Ht _0  (1.778 m)   Wt 186 lb (84.4 kg)   SpO2 97%   BMI 26.69 kg/m  Wt Readings from Last 3 Encounters:  05/03/20 186 lb (84.4 kg)  04/24/20 187 lb 9.6 oz (85.1 kg)  04/19/20 187 lb (84.8 kg)    Health Maintenance Due  Topic Date Due  . Hepatitis C Screening  Never done  . TETANUS/TDAP  Never done  . COLONOSCOPY (Pts 45-46yr Insurance coverage will need to be confirmed)  Never done  . OPHTHALMOLOGY EXAM  11/13/2018  . COVID-19 Vaccine (3 - Pfizer risk 4-dose series) 05/23/2019    There are no preventive care reminders to display for this patient.   No results found for: TSH Lab Results  Component Value Date   WBC 6.7 02/15/2020   HGB 16.2 02/15/2020   HCT 47.3 02/15/2020   MCV 93 02/15/2020   PLT 288 02/15/2020   Lab Results  Component Value Date   NA 145 (H) 02/15/2020   K 4.2 02/15/2020   CO2 22 02/15/2020   GLUCOSE 101 (H) 02/15/2020   BUN 12 02/15/2020   CREATININE 0.74 (L) 02/15/2020   BILITOT 0.5 02/15/2020   ALKPHOS 68 02/15/2020   AST 14 02/15/2020   ALT 17 02/15/2020   PROT 6.8 02/15/2020   ALBUMIN 4.5 02/15/2020   CALCIUM 9.5 02/15/2020   Lab Results  Component Value Date   CHOL 182 02/15/2020   Lab Results  Component Value Date   HDL 75  02/15/2020   Lab Results  Component Value Date   LDLCALC 93 02/15/2020   Lab Results  Component Value Date   TRIG 77 02/15/2020   Lab Results  Component Value Date   CHOLHDL 2.4 02/15/2020   Lab  Results  Component Value Date   HGBA1C 6.7 (H) 02/15/2020       Assessment & Plan:  Diagnoses and all orders for this visit: Solar keratosis -     Ambulatory referral to Dermatology Patient has a solar keratosis on right temporal area, needs to get it removed.  Primary osteoarthritis of left knee Left knee with stage 3 to 4 osteoarthritis, needs injection.  He does not want TKA yet.  After consent was obtained, using sterile technique the left knee was prepped and plain Lidocaine 1% was used as local anesthetic. The joint was entered and  Steroid 80 mg and 3 ml plain Lidocaine was then injected and the needle withdrawn.  The procedure was well tolerated.  The patient is asked to continue to rest the joint for a few more days before resuming regular activities.  It may be more painful for the first 1-2 days.  Watch for fever, or increased swelling or persistent pain in the joint. Call or return to clinic prn if such symptoms occur or there is failure to improve as anticipated.      I spent 20 minutes dedicated to the care of this patient on the date of this encounter to include face-to-face time with the patient, as well as:   Follow-up: Return if symptoms worsen or fail to improve.  An After Visit Summary was printed and given to the patient.  Reinaldo Meeker, MD [facility] [phone]

## 2020-06-19 ENCOUNTER — Other Ambulatory Visit: Payer: Self-pay | Admitting: Legal Medicine

## 2020-06-19 DIAGNOSIS — M1712 Unilateral primary osteoarthritis, left knee: Secondary | ICD-10-CM

## 2020-06-20 ENCOUNTER — Encounter: Payer: Self-pay | Admitting: Legal Medicine

## 2020-06-20 ENCOUNTER — Other Ambulatory Visit: Payer: Self-pay

## 2020-06-20 ENCOUNTER — Ambulatory Visit (INDEPENDENT_AMBULATORY_CARE_PROVIDER_SITE_OTHER): Payer: Medicare HMO | Admitting: Legal Medicine

## 2020-06-20 VITALS — BP 126/80 | HR 77 | Temp 97.2°F | Ht 70.0 in | Wt 183.0 lb

## 2020-06-20 DIAGNOSIS — Z794 Long term (current) use of insulin: Secondary | ICD-10-CM

## 2020-06-20 DIAGNOSIS — Z6826 Body mass index (BMI) 26.0-26.9, adult: Secondary | ICD-10-CM

## 2020-06-20 DIAGNOSIS — N529 Male erectile dysfunction, unspecified: Secondary | ICD-10-CM

## 2020-06-20 DIAGNOSIS — M1711 Unilateral primary osteoarthritis, right knee: Secondary | ICD-10-CM | POA: Diagnosis not present

## 2020-06-20 DIAGNOSIS — E1122 Type 2 diabetes mellitus with diabetic chronic kidney disease: Secondary | ICD-10-CM | POA: Diagnosis not present

## 2020-06-20 DIAGNOSIS — E1142 Type 2 diabetes mellitus with diabetic polyneuropathy: Secondary | ICD-10-CM

## 2020-06-20 DIAGNOSIS — M1712 Unilateral primary osteoarthritis, left knee: Secondary | ICD-10-CM

## 2020-06-20 DIAGNOSIS — E782 Mixed hyperlipidemia: Secondary | ICD-10-CM

## 2020-06-20 DIAGNOSIS — E291 Testicular hypofunction: Secondary | ICD-10-CM

## 2020-06-20 DIAGNOSIS — I10 Essential (primary) hypertension: Secondary | ICD-10-CM | POA: Diagnosis not present

## 2020-06-20 DIAGNOSIS — N1831 Chronic kidney disease, stage 3a: Secondary | ICD-10-CM | POA: Diagnosis not present

## 2020-06-20 MED ORDER — SILDENAFIL CITRATE 100 MG PO TABS: 100.0000 mg | ORAL_TABLET | Freq: Every day | ORAL | 3 refills | Status: DC | PRN

## 2020-06-20 NOTE — Progress Notes (Signed)
Subjective:  Patient ID: Clayton James, male    DOB: 03/25/1950  Age: 70 y.o. MRN: [id number]  Chief Complaint  Patient presents with  . Diabetes  . Hyperlipidemia  . Hypertension    HPI: chronic visit  Patient present with type 2 diabetes.  Specifically, this is type 2, insulin requiring diabetes, complicated by polyneuropathy and renal disease.  Compliance with treatment has been good; patient take medicines as directed, maintains diet and exercise regimen, follows up as directed, and is keeping glucose diary.  Date of  diagnosis 2010.  Depression screen has been performed.Tobacco screen nonsmoker. Current medicines for diabetes metformin, farxiga, tresiba 30 units.  Patient is on none for renal protection and atorvastatin for cholesterol control.  Patient performs foot exams daily and last ophthalmologic exam was yes  Patient presents for follow up of hypertension.  Patient tolerating lotrel well with side effects.  Patient was diagnosed with hypertension 2010 so has been treated for hypertension for 10 years.Patient is working on maintaining diet and exercise regimen and follows up as directed. Complication include none.  Patient presents with hyperlipidemia.  Compliance with treatment has been good; patient takes medicines as directed, maintains low cholesterol diet, follows up as directed, and maintains exercise regimen.  Patient is using atorvastatin without problems..   Current Outpatient Medications on File Prior to Visit  Medication Sig Dispense Refill  . ALPRAZolam (XANAX) 0.5 MG tablet Take 1 tablet by mouth twice daily as needed 60 tablet 3  . amLODipine-benazepril (LOTREL) 10-20 MG capsule Take 1 capsule by mouth once daily 90 capsule 2  . atorvastatin (LIPITOR) 40 MG tablet Take 1 tablet by mouth once daily 90 tablet 2  . BD PEN NEEDLE MICRO U/F 32G X 6 MM MISC USE AS DIRECTED ONCE DAILY 100 each 4  . Blood Glucose Monitoring Suppl (ONE TOUCH ULTRA 2) w/Device KIT USE TO  CHECK GLUCOSE THREE TIMES DAILY    . Diclofenac Sodium CR 100 MG 24 hr tablet Take 1 tablet by mouth once daily 90 tablet 2  . FARXIGA 10 MG TABS tablet Take 1 tablet by mouth once daily 90 tablet 2  . metFORMIN (GLUCOPHAGE) 1000 MG tablet Take 1 tablet by mouth twice daily 180 tablet 2  . traMADol (ULTRAM) 50 MG tablet TAKE 1 TABLET BY MOUTH EVERY 8 HOURS AS NEEDED FOR  MODERATE  PAIN 30 tablet 3  . TRESIBA FLEXTOUCH 100 UNIT/ML FlexTouch Pen INJECT 30 UNITS SUBCUTANEOUSLY ONCE DAILY 15 mL 6   No current facility-administered medications on file prior to visit.   Past Medical History:  Diagnosis Date  . Benign essential hypertension 04/27/2019  . Chronic kidney disease, stage 3a ([place]) 04/27/2019  . Mixed hyperlipidemia 04/27/2019  . Type 2 diabetes mellitus with diabetic polyneuropathy ([place]) 04/27/2019  . Type 2 diabetes mellitus with stage 3 chronic kidney disease ([place]) 04/27/2019   Past Surgical History:  Procedure Laterality Date  . ELBOW SURGERY Left 1994  . KNEE ARTHROSCOPY Right 1998  . KNEE CARTILAGE SURGERY Left 1994    Family History  Problem Relation Age of Onset  . Diabetes Mother   . Kidney disease Mother   . Heart disease Father    Social History   Socioeconomic History  . Marital status: Married    Spouse name: Not on file  . Number of children: 5  . Years of education: Not on file  . Highest education level: Not on file  Occupational History  . Not on file  Tobacco  Use  . Smoking status: Former Smoker    Years: 8.00    Types: Cigarettes    Quit date: 2000    Years since quitting: 22.4  . Smokeless tobacco: Current User    Types: Chew  . Tobacco comment: 1 can per week  Vaping Use  . Vaping Use: Never used  Substance and Sexual Activity  . Alcohol use: Yes    Alcohol/week: 1.0 standard drink    Types: 1 Cans of beer per week    Comment: one drink per dayd  . Drug use: Never  . Sexual activity: Yes    Partners: Female  Other Topics Concern  . Not on file   Social History Narrative  . Not on file   Social Determinants of Health   Financial Resource Strain: Not on file  Food Insecurity: No Food Insecurity  . Worried About Charity fundraiser in the Last Year: Never true  . Ran Out of Food in the Last Year: Never true  Transportation Needs: No Transportation Needs  . Lack of Transportation (Medical): No  . Lack of Transportation (Non-Medical): No  Physical Activity: Not on file  Stress: Not on file  Social Connections: Not on file    Review of Systems  Constitutional: Negative for chills, diaphoresis, fatigue and fever.  HENT: Negative for congestion, ear pain and sore throat.   Eyes: Negative for visual disturbance.  Respiratory: Negative for cough and shortness of breath.   Cardiovascular: Negative for chest pain and leg swelling.  Gastrointestinal: Negative for abdominal pain, constipation, diarrhea, nausea and vomiting.  Genitourinary: Negative for dysuria and urgency.  Musculoskeletal: Negative for arthralgias and myalgias.  Neurological: Negative for dizziness and headaches.  Psychiatric/Behavioral: Negative for dysphoric mood.     Objective:  BP 126/80   Pulse 77   Temp (!) 97.2 F (36.2 C)   Ht 5' 10"  (1.778 m)   Wt 183 lb (83 kg)   SpO2 99%   BMI 26.26 kg/m   BP/Weight 06/20/2020 5/46/5035 04/25/5679  Systolic BP 275 170 017  Diastolic BP 80 80 74  Wt. (Lbs) 183 186 187.6  BMI 26.26 26.69 26.92    Physical Exam Vitals reviewed.  Constitutional:      Appearance: Normal appearance. He is normal weight.  HENT:     Head: Normocephalic.     Right Ear: Tympanic membrane, ear canal and external ear normal.     Left Ear: Tympanic membrane, ear canal and external ear normal.     Mouth/Throat:     Mouth: Mucous membranes are moist.     Pharynx: Oropharynx is clear.  Eyes:     Extraocular Movements: Extraocular movements intact.     Conjunctiva/sclera: Conjunctivae normal.     Pupils: Pupils are equal, round, and  reactive to light.  Cardiovascular:     Rate and Rhythm: Normal rate and regular rhythm.     Pulses: Normal pulses.     Heart sounds: Normal heart sounds. No murmur heard. No gallop.   Pulmonary:     Effort: Pulmonary effort is normal. No respiratory distress.     Breath sounds: Normal breath sounds. No rales.  Abdominal:     General: Abdomen is flat. Bowel sounds are normal. There is no distension.     Palpations: Abdomen is soft.     Tenderness: There is no abdominal tenderness.  Musculoskeletal:        General: Tenderness present. Normal range of motion.     Cervical  back: Normal range of motion.  Skin:    General: Skin is warm and dry.     Capillary Refill: Capillary refill takes less than 2 seconds.  Neurological:     General: No focal deficit present.     Mental Status: He is alert and oriented to person, place, and time. Mental status is at baseline.     Diabetic Foot Exam - Simple   Simple Foot Form Diabetic Foot exam was performed with the following findings: Yes 06/20/2020  9:29 AM  Visual Inspection No deformities, no ulcerations, no other skin breakdown bilaterally: Yes Sensation Testing See comments: Yes Pulse Check Posterior Tibialis and Dorsalis pulse intact bilaterally: Yes Comments Decreased touch      Lab Results  Component Value Date   WBC 6.7 02/15/2020   HGB 16.2 02/15/2020   HCT 47.3 02/15/2020   PLT 288 02/15/2020   GLUCOSE 101 (H) 02/15/2020   CHOL 182 02/15/2020   TRIG 77 02/15/2020   HDL 75 02/15/2020   LDLCALC 93 02/15/2020   ALT 17 02/15/2020   AST 14 02/15/2020   NA 145 (H) 02/15/2020   K 4.2 02/15/2020   CL 106 02/15/2020   CREATININE 0.74 (L) 02/15/2020   BUN 12 02/15/2020   CO2 22 02/15/2020   HGBA1C 6.7 (H) 02/15/2020   MICROALBUR 80 05/17/2019      Assessment & Plan:   1. Primary osteoarthritis of right knee Patient has severe OA right knee  2. Primary osteoarthritis of left knee Patient has severe OA left  knee  3. Chronic kidney disease, stage 3a ([place]) AN INDIVIDUAL CARE PLAN for renal disease was established and reinforced today.  The patient's status was assessed using clinical findings on exam, labs, and other diagnostic testing. Patient's success at meeting treatment goals based on disease specific evidence-bassed guidelines and found to be in good control. RECOMMENDATIONS include maintaining present medicines and treatment.  4. Benign essential hypertension - CBC with Differential/Platelet - Comprehensive metabolic panel An individual hypertension care plan was established and reinforced today.  The patient's status was assessed using clinical findings on exam and labs or diagnostic tests. The patient's success at meeting treatment goals on disease specific evidence-based guidelines and found to be well controlled. SELF MANAGEMENT: The patient and I together assessed ways to personally work towards obtaining the recommended goals. RECOMMENDATIONS: avoid decongestants found in common cold remedies, decrease consumption of alcohol, perform routine monitoring of BP with home BP cuff, exercise, reduction of dietary salt, take medicines as prescribed, try not to miss doses and quit smoking.  Regular exercise and maintaining a healthy weight is needed.  Stress reduction may help. A CLINICAL SUMMARY including written plan identify barriers to care unique to individual due to social or financial issues.  We attempt to mutually creat solutions for individual and family understanding.  5. Type 2 diabetes mellitus with diabetic polyneuropathy, with long-term current use of insulin ([place]) An individual care plan for diabetes was established and reinforced today.  The patient's status was assessed using clinical findings on exam, labs and diagnostic testing. Patient success at meeting goals based on disease specific evidence-based guidelines and found to be fair controlled. Medications were assessed and  patient's understanding of the medical issues , including barriers were assessed. Recommend adherence to a diabetic diet, a graduated exercise program, HgbA1c level is checked quarterly, and urine microalbumin performed yearly .  Annual mono-filament sensation testing performed. Lower blood pressure and control hyperlipidemia is important. Get annual eye exams  and annual flu shots and smoking cessation discussed.  Self management goals were discussed.  6. Mixed hyperlipidemia - Lipid panel AN INDIVIDUAL CARE PLAN for hyperlipidemia/ cholesterol was established and reinforced today.  The patient's status was assessed using clinical findings on exam, lab and other diagnostic tests. The patient's disease status was assessed based on evidence-based guidelines and found to be fair controlled. MEDICATIONS were rev iewed. SELF MANAGEMENT GOALS have been discussed and patient's success at attaining the goal of low cholesterol was assessed. RECOMMENDATION given include regular exercise 3 days a week and low cholesterol/low fat diet. CLINICAL SUMMARY including written plan to identify barriers unique to the patient due to social or economic  reasons was discussed.  7. Type 2 diabetes mellitus with stage 3a chronic kidney disease, without long-term current use of insulin ([facility]) - Hemoglobin A1c An individual care plan for diabetes was established and reinforced today.  The patient's status was assessed using clinical findings on exam, labs and diagnostic testing. Patient success at meeting goals based on disease specific evidence-based guidelines and found to be fair controlled. Medications were assessed and patient's understanding of the medical issues , including barriers were assessed. Recommend adherence to a diabetic diet, a graduated exercise program, HgbA1c level is checked quarterly, and urine microalbumin performed yearly .  Annual mono-filament sensation testing performed. Lower blood pressure and  control hyperlipidemia is important. Get annual eye exams and annual flu shots and smoking cessation discussed.  Self management goals were discussed.  8. Vasculogenic erectile dysfunction, unspecified vasculogenic erectile dysfunction type - sildenafil (VIAGRA) 100 MG tablet; Take 1 tablet (100 mg total) by mouth daily as needed for erectile dysfunction.  Dispense: 10 tablet; Refill: 3 Patient has ED , we will try viagra  9. Hypogonadism in male - Testosterone Free, Profile I Patient needs to be tested for hypogonadism with ED  10. BMI 26.0-26.9,adult Maintain diet and exercise   30 minute visit      Follow-up: Return in about 4 months (around 10/20/2020) for fasting.  An After Visit Summary was printed and given to the patient.  Reinaldo Meeker, MD [facility] [phone]

## 2020-06-21 LAB — COMPREHENSIVE METABOLIC PANEL
ALT: 17 IU/L (ref 0–44)
AST: 16 IU/L (ref 0–40)
Albumin/Globulin Ratio: 1.8 (ref 1.2–2.2)
Albumin: 4.4 g/dL (ref 3.8–4.8)
Alkaline Phosphatase: 68 IU/L (ref 44–121)
BUN/Creatinine Ratio: 21 (ref 10–24)
BUN: 16 mg/dL (ref 8–27)
Bilirubin Total: 0.6 mg/dL (ref 0.0–1.2)
CO2: 22 mmol/L (ref 20–29)
Calcium: 9.7 mg/dL (ref 8.6–10.2)
Chloride: 104 mmol/L (ref 96–106)
Creatinine, Ser: 0.75 mg/dL — ABNORMAL LOW (ref 0.76–1.27)
Globulin, Total: 2.5 g/dL (ref 1.5–4.5)
Glucose: 86 mg/dL (ref 65–99)
Potassium: 4.5 mmol/L (ref 3.5–5.2)
Sodium: 142 mmol/L (ref 134–144)
Total Protein: 6.9 g/dL (ref 6.0–8.5)
eGFR: 97 mL/min/{1.73_m2} (ref >59)

## 2020-06-21 LAB — CBC WITH DIFFERENTIAL/PLATELET
Basophils Absolute: 0.1 10*3/uL (ref 0.0–0.2)
Basos: 1 %
EOS (ABSOLUTE): 0.1 10*3/uL (ref 0.0–0.4)
Eos: 2 %
Hematocrit: 44.8 % (ref 37.5–51.0)
Hemoglobin: 15.7 g/dL (ref 13.0–17.7)
Immature Grans (Abs): 0.1 10*3/uL (ref 0.0–0.1)
Immature Granulocytes: 1 %
Lymphocytes Absolute: 1.5 10*3/uL (ref 0.7–3.1)
Lymphs: 15 %
MCH: 32.8 pg (ref 26.6–33.0)
MCHC: 35 g/dL (ref 31.5–35.7)
MCV: 94 fL (ref 79–97)
Monocytes Absolute: 0.7 10*3/uL (ref 0.1–0.9)
Monocytes: 8 %
Neutrophils Absolute: 7.2 10*3/uL — ABNORMAL HIGH (ref 1.4–7.0)
Neutrophils: 73 %
Platelets: 322 10*3/uL (ref 150–450)
RBC: 4.79 x10E6/uL (ref 4.14–5.80)
RDW: 13.3 % (ref 11.6–15.4)
WBC: 9.6 10*3/uL (ref 3.4–10.8)

## 2020-06-21 LAB — TESTOSTERONE FREE, PROFILE I
Albumin: 4.3 g/dL (ref 3.8–4.8)
Sex Hormone Binding: 45.8 nmol/L (ref 19.3–76.4)
Testost., Free, Calc: 46.4 pg/mL (ref 34.7–150.3)
Testosterone: 292 ng/dL (ref 264–916)

## 2020-06-21 LAB — HEMOGLOBIN A1C
Est. average glucose Bld gHb Est-mCnc: 146 mg/dL
Hgb A1c MFr Bld: 6.7 % — ABNORMAL HIGH (ref 4.8–5.6)

## 2020-06-21 LAB — LIPID PANEL
Chol/HDL Ratio: 2.3 ratio (ref 0.0–5.0)
Cholesterol, Total: 162 mg/dL (ref 100–199)
HDL: 69 mg/dL (ref >39)
LDL Chol Calc (NIH): 81 mg/dL (ref 0–99)
Triglycerides: 57 mg/dL (ref 0–149)
VLDL Cholesterol Cal: 12 mg/dL (ref 5–40)

## 2020-06-21 LAB — CARDIOVASCULAR RISK ASSESSMENT

## 2020-06-21 NOTE — Progress Notes (Signed)
Testosterone normal level, cbc normal, kidney and liver tests normal, A1c 6.7 good, cholesterol normal,  lp

## 2020-06-24 ENCOUNTER — Other Ambulatory Visit: Payer: Self-pay

## 2020-06-24 DIAGNOSIS — Z1211 Encounter for screening for malignant neoplasm of colon: Secondary | ICD-10-CM | POA: Diagnosis not present

## 2020-06-24 LAB — COLOGUARD: Cologuard: POSITIVE — AB

## 2020-06-25 MED ORDER — TRESIBA FLEXTOUCH 100 UNIT/ML ~~LOC~~ SOPN: PEN_INJECTOR | SUBCUTANEOUS | 1 refills | Status: DC

## 2020-06-29 ENCOUNTER — Other Ambulatory Visit: Payer: Self-pay | Admitting: Legal Medicine

## 2020-06-29 DIAGNOSIS — F419 Anxiety disorder, unspecified: Secondary | ICD-10-CM

## 2020-07-01 ENCOUNTER — Other Ambulatory Visit: Payer: Self-pay

## 2020-07-01 DIAGNOSIS — E119 Type 2 diabetes mellitus without complications: Secondary | ICD-10-CM | POA: Diagnosis not present

## 2020-07-01 DIAGNOSIS — Z1211 Encounter for screening for malignant neoplasm of colon: Secondary | ICD-10-CM

## 2020-07-01 DIAGNOSIS — R195 Other fecal abnormalities: Secondary | ICD-10-CM

## 2020-07-01 NOTE — Progress Notes (Signed)
Cologuard result received, POSITIVE.  Patient made aware.  He agreed to a referral to GI - Dr Lyda Jester.

## 2020-07-04 LAB — HM DIABETES EYE EXAM

## 2020-07-25 ENCOUNTER — Ambulatory Visit: Payer: Medicare HMO | Admitting: Legal Medicine

## 2020-07-25 ENCOUNTER — Other Ambulatory Visit: Payer: Self-pay

## 2020-07-25 ENCOUNTER — Ambulatory Visit (INDEPENDENT_AMBULATORY_CARE_PROVIDER_SITE_OTHER): Payer: Medicare HMO | Admitting: Family Medicine

## 2020-07-25 VITALS — BP 136/80 | HR 70 | Temp 97.9°F | Ht 70.0 in | Wt 185.2 lb

## 2020-07-25 DIAGNOSIS — M1711 Unilateral primary osteoarthritis, right knee: Secondary | ICD-10-CM

## 2020-07-25 MED ORDER — TRIAMCINOLONE ACETONIDE 40 MG/ML IJ SUSP
80.0000 mg | Freq: Once | INTRAMUSCULAR | Status: AC
  Administered 2020-07-25: 80 mg via INTRA_ARTICULAR

## 2020-07-25 NOTE — Progress Notes (Signed)
Acute Office Visit  Subjective:    Patient ID: Clayton James, male    DOB: [birth year], 70 y.o.   MRN: [id number]  Chief Complaint  Patient presents with   Knee Pain    HPI Patient is in today for right knee pain x years, has had injections in the past which have helped. Would like to have a cortisone injx today. Has had osteoarthritis or years typically gets kenalog injection every 3-6 months.  Past Medical History:  Diagnosis Date   Benign essential hypertension 04/27/2019   Chronic kidney disease, stage 3a ([place]) 04/27/2019   Mixed hyperlipidemia 04/27/2019   Type 2 diabetes mellitus with diabetic polyneuropathy ([place]) 04/27/2019   Type 2 diabetes mellitus with stage 3 chronic kidney disease ([place]) 04/27/2019    Past Surgical History:  Procedure Laterality Date   ELBOW SURGERY Left 1994   KNEE ARTHROSCOPY Right 1998   KNEE CARTILAGE SURGERY Left 1994    Family History  Problem Relation Age of Onset   Diabetes Mother    Kidney disease Mother    Heart disease Father     Social History   Socioeconomic History   Marital status: Married    Spouse name: Not on file   Number of children: 5   Years of education: Not on file   Highest education level: Not on file  Occupational History   Not on file  Tobacco Use   Smoking status: Former    Years: 8.00    Types: Cigarettes    Quit date: 2000    Years since quitting: 22.5   Smokeless tobacco: Current    Types: Chew   Tobacco comments:    1 can per week  Vaping Use   Vaping Use: Never used  Substance and Sexual Activity   Alcohol use: Yes    Alcohol/week: 1.0 standard drink    Types: 1 Cans of beer per week    Comment: one drink per dayd   Drug use: Never   Sexual activity: Yes    Partners: Female  Other Topics Concern   Not on file  Social History Narrative   Not on file   Social Determinants of Health   Financial Resource Strain: Not on file  Food Insecurity: No Food Insecurity   Worried About Sales executive in the Last Year: Never true   Ran Out of Food in the Last Year: Never true  Transportation Needs: No Transportation Needs   Lack of Transportation (Medical): No   Lack of Transportation (Non-Medical): No  Physical Activity: Not on file  Stress: Not on file  Social Connections: Not on file  Intimate Partner Violence: Not At Risk   Fear of Current or Ex-Partner: No   Emotionally Abused: No   Physically Abused: No   Sexually Abused: No    Outpatient Medications Prior to Visit  Medication Sig Dispense Refill   ALPRAZolam (XANAX) 0.5 MG tablet Take 1 tablet by mouth twice daily as needed 60 tablet 3   amLODipine-benazepril (LOTREL) 10-20 MG capsule Take 1 capsule by mouth once daily 90 capsule 2   atorvastatin (LIPITOR) 40 MG tablet Take 1 tablet by mouth once daily 90 tablet 2   BD PEN NEEDLE MICRO U/F 32G X 6 MM MISC USE AS DIRECTED ONCE DAILY 100 each 4   Blood Glucose Monitoring Suppl (ONE TOUCH ULTRA 2) w/Device KIT USE TO CHECK GLUCOSE THREE TIMES DAILY     Diclofenac Sodium CR 100 MG 24 hr tablet  Take 1 tablet by mouth once daily 90 tablet 2   FARXIGA 10 MG TABS tablet Take 1 tablet by mouth once daily 90 tablet 2   insulin degludec (TRESIBA FLEXTOUCH) 100 UNIT/ML FlexTouch Pen INJECT 30 UNITS SUBCUTANEOUSLY ONCE DAILY 15 mL 1   metFORMIN (GLUCOPHAGE) 1000 MG tablet Take 1 tablet by mouth twice daily 180 tablet 2   sildenafil (VIAGRA) 100 MG tablet Take 1 tablet (100 mg total) by mouth daily as needed for erectile dysfunction. 10 tablet 3   traMADol (ULTRAM) 50 MG tablet TAKE 1 TABLET BY MOUTH EVERY 8 HOURS AS NEEDED FOR  MODERATE  PAIN 30 tablet 3   No facility-administered medications prior to visit.    Allergies  Allergen Reactions   Sulfamethoxazole Rash    Review of Systems  Constitutional:  Negative for chills, diaphoresis, fatigue and fever.  HENT:  Negative for congestion, ear pain and sore throat.   Respiratory:  Negative for cough and shortness of breath.    Cardiovascular:  Negative for chest pain and leg swelling.  Musculoskeletal:  Positive for arthralgias (BL knee pain). Negative for myalgias.      Objective:    Physical Exam Vitals reviewed.  Constitutional:      Appearance: Normal appearance. He is normal weight.  Musculoskeletal:     Right knee: Deformity present. Tenderness present.     Comments: BL Varus Knees   Neurological:     Mental Status: He is alert.   BP 136/80   Pulse 70   Temp 97.9 F (36.6 C)   Ht 5' 10"  (1.778 m)   Wt 185 lb 3.2 oz (84 kg)   SpO2 98%   BMI 26.57 kg/m  Wt Readings from Last 3 Encounters:  08/02/20 185 lb (83.9 kg)  07/25/20 185 lb 3.2 oz (84 kg)  06/20/20 183 lb (83 kg)    Health Maintenance Due  Topic Date Due   Hepatitis C Screening  Never done   TETANUS/TDAP  Never done   Zoster Vaccines- Shingrix (1 of 2) Never done   COLONOSCOPY (Pts 45-50yr Insurance coverage will need to be confirmed)  Never done    There are no preventive care reminders to display for this patient.   No results found for: TSH Lab Results  Component Value Date   WBC 9.6 06/20/2020   HGB 15.7 06/20/2020   HCT 44.8 06/20/2020   MCV 94 06/20/2020   PLT 322 06/20/2020   Lab Results  Component Value Date   NA 142 06/20/2020   K 4.5 06/20/2020   CO2 22 06/20/2020   GLUCOSE 86 06/20/2020   BUN 16 06/20/2020   CREATININE 0.75 (L) 06/20/2020   BILITOT 0.6 06/20/2020   ALKPHOS 68 06/20/2020   AST 16 06/20/2020   ALT 17 06/20/2020   PROT 6.9 06/20/2020   ALBUMIN 4.3 06/20/2020   CALCIUM 9.7 06/20/2020   EGFR 97 06/20/2020   Lab Results  Component Value Date   CHOL 162 06/20/2020   Lab Results  Component Value Date   HDL 69 06/20/2020   Lab Results  Component Value Date   LDLCALC 81 06/20/2020   Lab Results  Component Value Date   TRIG 57 06/20/2020   Lab Results  Component Value Date   CHOLHDL 2.3 06/20/2020   Lab Results  Component Value Date   HGBA1C 6.7 (H) 06/20/2020        Assessment & Plan:  1. Primary osteoarthritis of right knee - triamcinolone acetonide (KENALOG-40) injection  80 mg   After consent was obtained, using sterile technique the right knee was prepped and plain Lidocaine 1% was used as local anesthetic. Steroid 80 mg and 5 ml plain Lidocaine was then injected and the needle withdrawn.  The procedure was well tolerated.  The patient is asked to continue to rest the joint for a few more days before resuming regular activities.  It may be more painful for the first 1-2 days.  Watch for fever, or increased swelling or persistent pain in the joint. Call or return to clinic prn if such symptoms occur or there is failure to improve as anticipated.   Follow-up: Return in about 1 week (around 08/01/2020) for other knee to be injected.  An After Visit Summary was printed and given to the patient.  Rochel Brome, MD [facility] [phone]

## 2020-08-02 ENCOUNTER — Ambulatory Visit (INDEPENDENT_AMBULATORY_CARE_PROVIDER_SITE_OTHER): Payer: Medicare HMO | Admitting: Legal Medicine

## 2020-08-02 ENCOUNTER — Encounter: Payer: Self-pay | Admitting: Legal Medicine

## 2020-08-02 ENCOUNTER — Other Ambulatory Visit: Payer: Self-pay

## 2020-08-02 VITALS — BP 140/68 | HR 90 | Temp 97.8°F | Resp 16 | Ht 70.0 in | Wt 185.0 lb

## 2020-08-02 DIAGNOSIS — M1712 Unilateral primary osteoarthritis, left knee: Secondary | ICD-10-CM

## 2020-08-02 NOTE — Progress Notes (Signed)
Established Patient Office Visit  Subjective:  Patient ID: Clayton James, male    DOB: November 26, 1950  Age: 70 y.o. MRN: [id number]  CC:  Chief Complaint  Patient presents with   Knee Pain    Pain on left knee.    HPI Clayton James presents for right knee osteoarthritis, he has bruising.  He had synvisc in pat and steroid shots. He got injection in knee     last week. Cortisone.  He requests injection into left knee today.  Past Medical History:  Diagnosis Date   Benign essential hypertension 04/27/2019   Chronic kidney disease, stage 3a ([place]) 04/27/2019   Mixed hyperlipidemia 04/27/2019   Type 2 diabetes mellitus with diabetic polyneuropathy ([place]) 04/27/2019   Type 2 diabetes mellitus with stage 3 chronic kidney disease ([place]) 04/27/2019    Past Surgical History:  Procedure Laterality Date   ELBOW SURGERY Left 1994   KNEE ARTHROSCOPY Right 1998   KNEE CARTILAGE SURGERY Left 1994    Family History  Problem Relation Age of Onset   Diabetes Mother    Kidney disease Mother    Heart disease Father     Social History   Socioeconomic History   Marital status: Married    Spouse name: Not on file   Number of children: 5   Years of education: Not on file   Highest education level: Not on file  Occupational History   Not on file  Tobacco Use   Smoking status: Former    Years: 8.00    Types: Cigarettes    Quit date: 2000    Years since quitting: 22.5   Smokeless tobacco: Current    Types: Chew   Tobacco comments:    1 can per week  Vaping Use   Vaping Use: Never used  Substance and Sexual Activity   Alcohol use: Yes    Alcohol/week: 1.0 standard drink    Types: 1 Cans of beer per week    Comment: one drink per dayd   Drug use: Never   Sexual activity: Yes    Partners: Female  Other Topics Concern   Not on file  Social History Narrative   Not on file   Social Determinants of Health   Financial Resource Strain: Not on file  Food Insecurity: No Food Insecurity    Worried About Charity fundraiser in the Last Year: Never true   Ran Out of Food in the Last Year: Never true  Transportation Needs: No Transportation Needs   Lack of Transportation (Medical): No   Lack of Transportation (Non-Medical): No  Physical Activity: Not on file  Stress: Not on file  Social Connections: Not on file  Intimate Partner Violence: Not At Risk   Fear of Current or Ex-Partner: No   Emotionally Abused: No   Physically Abused: No   Sexually Abused: No    Outpatient Medications Prior to Visit  Medication Sig Dispense Refill   ALPRAZolam (XANAX) 0.5 MG tablet Take 1 tablet by mouth twice daily as needed 60 tablet 3   amLODipine-benazepril (LOTREL) 10-20 MG capsule Take 1 capsule by mouth once daily 90 capsule 2   atorvastatin (LIPITOR) 40 MG tablet Take 1 tablet by mouth once daily 90 tablet 2   BD PEN NEEDLE MICRO U/F 32G X 6 MM MISC USE AS DIRECTED ONCE DAILY 100 each 4   Blood Glucose Monitoring Suppl (ONE TOUCH ULTRA 2) w/Device KIT USE TO CHECK GLUCOSE THREE TIMES DAILY  Diclofenac Sodium CR 100 MG 24 hr tablet Take 1 tablet by mouth once daily 90 tablet 2   FARXIGA 10 MG TABS tablet Take 1 tablet by mouth once daily 90 tablet 2   insulin degludec (TRESIBA FLEXTOUCH) 100 UNIT/ML FlexTouch Pen INJECT 30 UNITS SUBCUTANEOUSLY ONCE DAILY 15 mL 1   metFORMIN (GLUCOPHAGE) 1000 MG tablet Take 1 tablet by mouth twice daily 180 tablet 2   sildenafil (VIAGRA) 100 MG tablet Take 1 tablet (100 mg total) by mouth daily as needed for erectile dysfunction. 10 tablet 3   traMADol (ULTRAM) 50 MG tablet TAKE 1 TABLET BY MOUTH EVERY 8 HOURS AS NEEDED FOR  MODERATE  PAIN 30 tablet 3   No facility-administered medications prior to visit.    Allergies  Allergen Reactions   Sulfamethoxazole Rash    ROS Review of Systems  Constitutional:  Negative for activity change and appetite change.  HENT:  Negative for congestion.   Eyes:  Negative for visual disturbance.  Respiratory:   Negative for chest tightness and shortness of breath.   Cardiovascular:  Negative for chest pain, palpitations and leg swelling.  Gastrointestinal:  Negative for abdominal distention and abdominal pain.  Endocrine: Negative for polyuria.  Genitourinary:  Negative for difficulty urinating and dysuria.  Musculoskeletal:  Negative for arthralgias and back pain.  Neurological: Negative.   Psychiatric/Behavioral:  Negative for agitation and behavioral problems.      Objective:    Physical Exam Vitals reviewed.  Constitutional:      General: He is not in acute distress.    Appearance: Normal appearance.  HENT:     Head: Normocephalic.     Right Ear: Tympanic membrane, ear canal and external ear normal.     Left Ear: Tympanic membrane, ear canal and external ear normal.     Nose: Nose normal.     Mouth/Throat:     Mouth: Mucous membranes are moist.     Pharynx: Oropharynx is clear.  Eyes:     Extraocular Movements: Extraocular movements intact.     Conjunctiva/sclera: Conjunctivae normal.     Pupils: Pupils are equal, round, and reactive to light.  Cardiovascular:     Rate and Rhythm: Normal rate and regular rhythm.     Pulses: Normal pulses.     Heart sounds: Normal heart sounds. No murmur heard.   No gallop.  Pulmonary:     Effort: Pulmonary effort is normal. No respiratory distress.     Breath sounds: Normal breath sounds. No wheezing.  Abdominal:     General: Abdomen is flat. Bowel sounds are normal. There is no distension.     Palpations: Abdomen is soft.     Tenderness: There is no abdominal tenderness.  Musculoskeletal:     Cervical back: Normal range of motion.     Right lower leg: No edema.     Left lower leg: No edema.     Comments: Left knee bruised, full Rom with crepitation, negative Lachaman and McMurray, he does not want TKA.  Try flexogenics  Skin:    Capillary Refill: Capillary refill takes less than 2 seconds.  Neurological:     General: No focal deficit  present.     Mental Status: He is alert and oriented to person, place, and time. Mental status is at baseline.  Psychiatric:        Mood and Affect: Mood normal.        Thought Content: Thought content normal.    BP 140/68  Pulse 90   Temp 97.8 F (36.6 C)   Resp 16   Ht 5' 10"  (1.778 m)   Wt 185 lb (83.9 kg)   SpO2 95%   BMI 26.54 kg/m  Wt Readings from Last 3 Encounters:  08/02/20 185 lb (83.9 kg)  07/25/20 185 lb 3.2 oz (84 kg)  06/20/20 183 lb (83 kg)     Health Maintenance Due  Topic Date Due   Hepatitis C Screening  Never done   TETANUS/TDAP  Never done   Zoster Vaccines- Shingrix (1 of 2) Never done   COLONOSCOPY (Pts 45-6yr Insurance coverage will need to be confirmed)  Never done    There are no preventive care reminders to display for this patient.  No results found for: TSH Lab Results  Component Value Date   WBC 9.6 06/20/2020   HGB 15.7 06/20/2020   HCT 44.8 06/20/2020   MCV 94 06/20/2020   PLT 322 06/20/2020   Lab Results  Component Value Date   NA 142 06/20/2020   K 4.5 06/20/2020   CO2 22 06/20/2020   GLUCOSE 86 06/20/2020   BUN 16 06/20/2020   CREATININE 0.75 (L) 06/20/2020   BILITOT 0.6 06/20/2020   ALKPHOS 68 06/20/2020   AST 16 06/20/2020   ALT 17 06/20/2020   PROT 6.9 06/20/2020   ALBUMIN 4.3 06/20/2020   CALCIUM 9.7 06/20/2020   EGFR 97 06/20/2020   Lab Results  Component Value Date   CHOL 162 06/20/2020   Lab Results  Component Value Date   HDL 69 06/20/2020   Lab Results  Component Value Date   LDLCALC 81 06/20/2020   Lab Results  Component Value Date   TRIG 57 06/20/2020   Lab Results  Component Value Date   CHOLHDL 2.3 06/20/2020   Lab Results  Component Value Date   HGBA1C 6.7 (H) 06/20/2020      Assessment & Plan:   Problem List Items Addressed This Visit       Musculoskeletal and Integument   Osteoarthritis of left knee - Primary   Relevant Orders   AMB referral to orthopedics  Left knee  injected, I gave information about flexogenics.  After consent was obtained, using sterile technique the left knee was prepped and plain Ethyl Chloride  was used as local anesthetic. The joint was entered and l.  Steroid 80 mg and 3 ml plain Lidocaine was then injected and the needle withdrawn.  The procedure was well tolerated.  The patient is asked to continue to rest the joint for a few more days before resuming regular activities.  It may be more painful for the first 1-2 days.  Watch for fever, or increased swelling or persistent pain in the joint. Call or return to clinic prn if such symptoms occur or there is failure to improve as anticipated.     Follow-up: Return if symptoms worsen or fail to improve.    LReinaldo Meeker MD

## 2020-08-09 ENCOUNTER — Encounter: Payer: Self-pay | Admitting: Family Medicine

## 2020-08-12 ENCOUNTER — Telehealth: Payer: Self-pay

## 2020-08-12 NOTE — Telephone Encounter (Signed)
I called patient to inform him that he has an appointment on 10/09/2020 at 9 am with Dr Lyda Jester. Patient verbalized to understand.

## 2020-08-13 ENCOUNTER — Other Ambulatory Visit: Payer: Self-pay | Admitting: Legal Medicine

## 2020-08-13 DIAGNOSIS — M1712 Unilateral primary osteoarthritis, left knee: Secondary | ICD-10-CM

## 2020-08-27 ENCOUNTER — Other Ambulatory Visit: Payer: Self-pay | Admitting: Legal Medicine

## 2020-09-04 DIAGNOSIS — M170 Bilateral primary osteoarthritis of knee: Secondary | ICD-10-CM | POA: Diagnosis not present

## 2020-09-11 ENCOUNTER — Telehealth: Payer: Self-pay | Admitting: Legal Medicine

## 2020-09-11 ENCOUNTER — Encounter: Payer: Self-pay | Admitting: Legal Medicine

## 2020-09-11 ENCOUNTER — Telehealth (INDEPENDENT_AMBULATORY_CARE_PROVIDER_SITE_OTHER): Payer: Medicare HMO | Admitting: Legal Medicine

## 2020-09-11 VITALS — Temp 99.8°F | Ht 70.0 in | Wt 185.0 lb

## 2020-09-11 DIAGNOSIS — U071 COVID-19: Secondary | ICD-10-CM | POA: Diagnosis not present

## 2020-09-11 DIAGNOSIS — E1142 Type 2 diabetes mellitus with diabetic polyneuropathy: Secondary | ICD-10-CM | POA: Diagnosis not present

## 2020-09-11 MED ORDER — MOLNUPIRAVIR EUA 200MG CAPSULE: 4.0000 | ORAL_CAPSULE | Freq: Two times a day (BID) | ORAL | 0 refills | Status: AC

## 2020-09-11 NOTE — Chronic Care Management (AMB) (Signed)
  Chronic Care Management   Note  09/11/2020 Name: Clayton James MRN: [id number] DOB: 1950/10/12  Clayton James is a 70 y.o. year old male who is a primary care patient of Lillard Anes, MD. I reached out to Clayton James by phone today in response to a referral sent by Clayton James PCP, Lillard Anes, MD.   Clayton James was given information about Chronic Care Management services today including:  CCM service includes personalized support from designated clinical staff supervised by his physician, including individualized plan of care and coordination with other care providers 24/7 contact phone numbers for assistance for urgent and routine care needs. Service will only be billed when office clinical staff spend 20 minutes or more in a month to coordinate care. Only one practitioner may furnish and bill the service in a calendar month. The patient may stop CCM services at any time (effective at the end of the month) by phone call to the office staff.   Clayton James  verbally agreed to assistance and services provided by embedded care coordination/care management team today.  Follow up plan:   Tatjana Secretary/administrator

## 2020-09-11 NOTE — Progress Notes (Signed)
Virtual Visit via Video Note   This visit type was conducted due to national recommendations for restrictions regarding the COVID-19 Pandemic (e.g. social distancing) in an effort to limit this patient's exposure and mitigate transmission in our community.  Due to his co-morbid illnesses, this patient is at least at moderate risk for complications without adequate follow up.  This format is felt to be most appropriate for this patient at this time.  All issues noted in this document were discussed and addressed.  A limited physical exam was performed with this format.  A verbal consent was obtained for the virtual visit.   Date:  09/11/2020   ID:  Clayton James, DOB January 29, 1950, MRN [id number]  Patient Location: Home Provider Location: Office/Clinic  PCP:  Lillard Anes, MD   Evaluation Performed:  New Patient Evaluation  Chief Complaint:  positive Covid test  History of Present Illness:    Clayton James is a 70 y.o. male with patient had symptoms since 2 sdays ago, the covid turned positive yesterday.  He now has cough, muagias, fever and headache like his wife.  The patient does have symptoms concerning for COVID-19 infection (fever, chills, cough, or new shortness of breath).    Past Medical History:  Diagnosis Date   Benign essential hypertension 04/27/2019   Chronic kidney disease, stage 3a ([place]) 04/27/2019   Mixed hyperlipidemia 04/27/2019   Type 2 diabetes mellitus with diabetic polyneuropathy ([place]) 04/27/2019   Type 2 diabetes mellitus with stage 3 chronic kidney disease ([place]) 04/27/2019    Past Surgical History:  Procedure Laterality Date   ELBOW SURGERY Left 1994   KNEE ARTHROSCOPY Right 1998   KNEE CARTILAGE SURGERY Left 1994    Family History  Problem Relation Age of Onset   Diabetes Mother    Kidney disease Mother    Heart disease Father     Social History   Socioeconomic History   Marital status: Married    Spouse name: Not on file   Number of  children: 5   Years of education: Not on file   Highest education level: Not on file  Occupational History   Not on file  Tobacco Use   Smoking status: Former    Years: 8.00    Types: Cigarettes    Quit date: 2000    Years since quitting: 22.6   Smokeless tobacco: Current    Types: Chew   Tobacco comments:    1 can per week  Vaping Use   Vaping Use: Never used  Substance and Sexual Activity   Alcohol use: Yes    Alcohol/week: 1.0 standard drink    Types: 1 Cans of beer per week    Comment: one drink per dayd   Drug use: Never   Sexual activity: Yes    Partners: Female  Other Topics Concern   Not on file  Social History Narrative   Not on file   Social Determinants of Health   Financial Resource Strain: Not on file  Food Insecurity: No Food Insecurity   Worried About Charity fundraiser in the Last Year: Never true   Ran Out of Food in the Last Year: Never true  Transportation Needs: No Transportation Needs   Lack of Transportation (Medical): No   Lack of Transportation (Non-Medical): No  Physical Activity: Not on file  Stress: Not on file  Social Connections: Not on file  Intimate Partner Violence: Not At Risk   Fear of Current or Ex-Partner: No  Emotionally Abused: No   Physically Abused: No   Sexually Abused: No    Outpatient Medications Prior to Visit  Medication Sig Dispense Refill   ALPRAZolam (XANAX) 0.5 MG tablet Take 1 tablet by mouth twice daily as needed 60 tablet 3   amLODipine-benazepril (LOTREL) 10-20 MG capsule Take 1 capsule by mouth once daily 90 capsule 2   atorvastatin (LIPITOR) 40 MG tablet Take 1 tablet by mouth once daily 90 tablet 2   BD PEN NEEDLE MICRO U/F 32G X 6 MM MISC USE AS DIRECTED ONCE DAILY 100 each 4   Blood Glucose Monitoring Suppl (ONE TOUCH ULTRA 2) w/Device KIT USE TO CHECK GLUCOSE THREE TIMES DAILY     Diclofenac Sodium CR 100 MG 24 hr tablet Take 1 tablet by mouth once daily 90 tablet 2   FARXIGA 10 MG TABS tablet Take 1  tablet by mouth once daily 90 tablet 2   insulin degludec (TRESIBA FLEXTOUCH) 100 UNIT/ML FlexTouch Pen INJECT 30 UNITS SUBCUTANEOUSLY ONCE DAILY 15 mL 1   metFORMIN (GLUCOPHAGE) 1000 MG tablet Take 1 tablet by mouth twice daily 180 tablet 2   sildenafil (VIAGRA) 100 MG tablet Take 1 tablet (100 mg total) by mouth daily as needed for erectile dysfunction. 10 tablet 3   traMADol (ULTRAM) 50 MG tablet TAKE 1 TABLET BY MOUTH EVERY 8 HOURS AS NEEDED FOR MODERATE PAIN 30 tablet 3   No facility-administered medications prior to visit.    Allergies:   Sulfamethoxazole   Social History   Tobacco Use   Smoking status: Former    Years: 8.00    Types: Cigarettes    Quit date: 2000    Years since quitting: 22.6   Smokeless tobacco: Current    Types: Chew   Tobacco comments:    1 can per week  Vaping Use   Vaping Use: Never used  Substance Use Topics   Alcohol use: Yes    Alcohol/week: 1.0 standard drink    Types: 1 Cans of beer per week    Comment: one drink per dayd   Drug use: Never     Review of Systems  Constitutional:  Positive for fever and malaise/fatigue. Negative for chills.  HENT:  Positive for congestion.   Eyes:  Negative for redness.  Respiratory:  Positive for cough.   Cardiovascular:  Negative for palpitations and leg swelling.  Musculoskeletal:  Positive for myalgias.  Neurological:  Positive for headaches.    Labs/Other Tests and Data Reviewed:    Recent Labs: 06/20/2020: ALT 17; BUN 16; Creatinine, Ser 0.75; Hemoglobin 15.7; Platelets 322; Potassium 4.5; Sodium 142   Recent Lipid Panel Lab Results  Component Value Date/Time   CHOL 162 06/20/2020 09:37 AM   TRIG 57 06/20/2020 09:37 AM   HDL 69 06/20/2020 09:37 AM   CHOLHDL 2.3 06/20/2020 09:37 AM   LDLCALC 81 06/20/2020 09:37 AM    Wt Readings from Last 3 Encounters:  09/11/20 185 lb (83.9 kg)  08/02/20 185 lb (83.9 kg)  07/25/20 185 lb 3.2 oz (84 kg)     Objective:    Vital Signs:  Temp 99.8 F  (37.7 C)   Ht 5' 10"  (1.778 m)   Wt 185 lb (83.9 kg)   BMI 26.54 kg/m    Physical Exam reviewed  ASSESSMENT & PLAN:   Diagnoses and all orders for this visit: COVID-19 -     molnupiravir EUA 200 mg CAPS; Take 4 capsules (800 mg total) by mouth 2 (two)  times daily for 5 days.  Treat covid with molpupiravir, keep quarantined for 5 days      COVID-19 Education: The signs and symptoms of COVID-19 were discussed with the patient and how to seek care for testing (follow up with PCP or arrange E-visit). The importance of social distancing was discussed today.   I spent 20 minutes dedicated to the care of this patient on the date of this encounter to include face-to-face time with the patient, as well as:   Follow Up:  In Person prn  Signed, Reinaldo Meeker, MD  09/11/2020 8:58 AM    [place]

## 2020-10-08 ENCOUNTER — Other Ambulatory Visit: Payer: Self-pay | Admitting: Legal Medicine

## 2020-10-08 DIAGNOSIS — M1712 Unilateral primary osteoarthritis, left knee: Secondary | ICD-10-CM

## 2020-10-09 DIAGNOSIS — Z01818 Encounter for other preprocedural examination: Secondary | ICD-10-CM | POA: Diagnosis not present

## 2020-10-09 DIAGNOSIS — R195 Other fecal abnormalities: Secondary | ICD-10-CM | POA: Diagnosis not present

## 2020-10-11 ENCOUNTER — Other Ambulatory Visit: Payer: Self-pay | Admitting: Family Medicine

## 2020-10-16 ENCOUNTER — Telehealth: Payer: Self-pay

## 2020-10-16 NOTE — Telephone Encounter (Signed)
Left message for patient to call back and reschedule appointment for 10/3.

## 2020-10-17 DIAGNOSIS — M25562 Pain in left knee: Secondary | ICD-10-CM | POA: Diagnosis not present

## 2020-10-17 DIAGNOSIS — M170 Bilateral primary osteoarthritis of knee: Secondary | ICD-10-CM | POA: Diagnosis not present

## 2020-10-17 DIAGNOSIS — M25561 Pain in right knee: Secondary | ICD-10-CM | POA: Diagnosis not present

## 2020-10-21 ENCOUNTER — Telehealth: Payer: Self-pay

## 2020-10-21 ENCOUNTER — Other Ambulatory Visit: Payer: Self-pay | Admitting: Family Medicine

## 2020-10-21 ENCOUNTER — Other Ambulatory Visit: Payer: Self-pay

## 2020-10-21 ENCOUNTER — Ambulatory Visit (INDEPENDENT_AMBULATORY_CARE_PROVIDER_SITE_OTHER): Payer: Medicare HMO

## 2020-10-21 ENCOUNTER — Ambulatory Visit: Payer: Medicare HMO | Admitting: Legal Medicine

## 2020-10-21 DIAGNOSIS — E1142 Type 2 diabetes mellitus with diabetic polyneuropathy: Secondary | ICD-10-CM

## 2020-10-21 DIAGNOSIS — F419 Anxiety disorder, unspecified: Secondary | ICD-10-CM

## 2020-10-21 DIAGNOSIS — E782 Mixed hyperlipidemia: Secondary | ICD-10-CM

## 2020-10-21 DIAGNOSIS — M1712 Unilateral primary osteoarthritis, left knee: Secondary | ICD-10-CM

## 2020-10-21 DIAGNOSIS — I10 Essential (primary) hypertension: Secondary | ICD-10-CM

## 2020-10-21 MED ORDER — TRAMADOL HCL 50 MG PO TABS: ORAL_TABLET | ORAL | 0 refills | Status: DC

## 2020-10-21 MED ORDER — ALPRAZOLAM 0.5 MG PO TABS: 0.5000 mg | ORAL_TABLET | Freq: Two times a day (BID) | ORAL | 1 refills | Status: DC | PRN

## 2020-10-21 NOTE — Progress Notes (Signed)
Chronic Care Management Pharmacy Note  10/21/2020 Name:  Clayton James MRN:  [id number] DOB:  [birth year]  Summary: -Pleasant 70 year old male presents for initial CCM visit. 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 While doing this, 2 of his sons died and he decided to retire. 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 He hunts deer every year.  Recommendations/Changes made from today's visit: -Patient needs f/u with you, I asked him to call and schedule. He'd like a refill of Tramadol/Alprazolam and I told him to schedule the appointment first    Subjective: Clayton James is an 70 y.o. year old male who is a primary patient of Henrene Pastor, Zeb Comfort, MD.  The CCM team was consulted for assistance with disease management and care coordination needs.    Engaged with patient by telephone for initial visit in response to provider referral for pharmacy case management and/or care coordination services.   Consent to Services:  The patient was given the following information about Chronic Care Management services today, agreed to services, and gave verbal consent: 1. CCM service includes personalized support from designated clinical staff supervised by the primary care provider, including individualized plan of care and coordination with other care providers 2. 24/7 contact phone numbers for assistance for urgent and routine care needs. 3. Service will only be billed when office clinical staff spend 20 minutes or more in a month to coordinate care. 4. Only one practitioner may furnish and bill the service in a calendar month. 5.The patient may stop CCM services at any time (effective at the end of the month) by phone call to the office staff. 6. The patient will be responsible for cost sharing (co-pay)  of up to 20% of the service fee (after annual deductible is met). Patient agreed to services and consent obtained.  Patient Care Team: Lillard Anes, MD as PCP - General (Family Medicine) Burnice Logan, [facility] (Inactive) as Pharmacist (Pharmacist)  Recent office visits:  09-11-2020 Lillard Anes, MD. START Molnupiravir 200 mg take 4 capsules twice daily for 5 days.   08-02-2020 Lillard Anes, MD. Referral placed to orthopedics.   07-25-2020 Rochel Brome, MD. Order placed for HM diabetes eye exam. Kenalog injection given.   06-20-2020 Lillard Anes, MD. START Viagra 100 mg as needed. A1C= 6.7. Neutrophils absolute= 7.2. Creatinine = 0.75.   05-03-2020 Lillard Anes, MD. Referral placed to dermatology.   04-24-2020 Erie Noe, LPN. Medicare wellness visit. Cologuard = positive.   04-19-2020 Lillard Anes, MD. Steroid injection of right knee.   Recent consult visits:  07-01-2020 Ishmael Holter, MD (optometry). Unable to view encounter   Hospital visits:  None in previous 6 months   Objective:  Lab Results  Component Value Date   CREATININE 0.75 (L) 06/20/2020   BUN 16 06/20/2020   GFRNONAA 94 02/15/2020   GFRAA 109 02/15/2020   NA 142 06/20/2020   K 4.5 06/20/2020   CALCIUM 9.7 06/20/2020   CO2 22 06/20/2020   GLUCOSE 86 06/20/2020    Lab Results  Component Value Date/Time   HGBA1C 6.7 (H) 06/20/2020 09:37 AM   HGBA1C 6.7 (H) 02/15/2020 10:01 AM   MICROALBUR 80 05/17/2019 09:03 AM    Last diabetic Eye exam:  Lab Results  Component Value Date/Time   HMDIABEYEEXA No Retinopathy 07/04/2020 12:00 AM    Last diabetic Foot exam: No results found for: HMDIABFOOTEX   Lab Results  Component Value Date   CHOL 162 06/20/2020   HDL 69 06/20/2020   LDLCALC 81 06/20/2020   TRIG 57 06/20/2020   CHOLHDL 2.3 06/20/2020    Hepatic Function Latest Ref Rng & Units 06/20/2020 06/20/2020 02/15/2020  Total Protein 6.0 - 8.5  g/dL - 6.9 6.8  Albumin 3.8 - 4.8 g/dL 4.3 4.4 4.5  AST 0 - 40 IU/L - 16 14  ALT 0 - 44 IU/L - 17 17  Alk Phosphatase 44 - 121 IU/L - 68 68  Total Bilirubin 0.0 - 1.2 mg/dL - 0.6 0.5    No results found for: TSH, FREET4  CBC Latest Ref Rng & Units 06/20/2020 02/15/2020 10/04/2019  WBC 3.4 - 10.8 x10E3/uL 9.6 6.7 7.0  Hemoglobin 13.0 - 17.7 g/dL 15.7 16.2 16.4  Hematocrit 37.5 - 51.0 % 44.8 47.3 47.8  Platelets 150 - 450 x10E3/uL 322 288 317    No results found for: VD25OH  Clinical ASCVD: Yes  The 10-year ASCVD risk score (Arnett DK, et al., 2019) is: 33.4%   Values used to calculate the score:     Age: 70 years     Sex: Male     Is Non-Hispanic African American: No     Diabetic: Yes     Tobacco smoker: No     Systolic Blood Pressure: 256 mmHg     Is BP treated: Yes     HDL Cholesterol: 69 mg/dL     Total Cholesterol: 162 mg/dL    Depression screen [facility] 2/9 06/20/2020 04/24/2020 05/17/2019  Decreased Interest 0 0 0  Down, Depressed, Hopeless 0 0 0  PHQ - 2 Score 0 0 0     Other: (CHADS2VASc if Afib, MMRC or CAT for COPD, ACT, DEXA)  Social History   Tobacco Use  Smoking Status Former   Years: 8.00   Types: Cigarettes   Quit date: 2000   Years since quitting: 22.7  Smokeless Tobacco Current   Types: Chew  Tobacco Comments   1 can per week   BP Readings from Last 3 Encounters:  08/02/20 140/68  07/25/20 136/80  06/20/20 126/80   Pulse Readings from Last 3 Encounters:  08/02/20 90  07/25/20 70  06/20/20 77   Wt Readings from Last 3 Encounters:  09/11/20 185 lb (83.9 kg)  08/02/20 185 lb (83.9 kg)  07/25/20 185 lb 3.2 oz (84 kg)   BMI Readings from Last 3 Encounters:  09/11/20 26.54 kg/m  08/02/20 26.54 kg/m  07/25/20 26.57 kg/m    Assessment/Interventions: Review of patient past medical history, allergies, medications, health status, including review of consultants reports, laboratory and other test data, was performed as part of comprehensive  evaluation and provision of chronic care management services.   SDOH:  (Social Determinants of Health) assessments and interventions performed: Yes SDOH Interventions    Flowsheet Row Most Recent Value  SDOH Interventions   Financial Strain Interventions Other (Comment)  Transportation Interventions Intervention Not Indicated      SDOH Screenings   Alcohol Screen: Low Risk    Last Alcohol Screening Score (AUDIT): 1  Depression (PHQ2-9): Low Risk    PHQ-2 Score: 0  Financial Resource Strain: Low Risk    Difficulty of Paying Living Expenses: Not hard at all  Food Insecurity: No Food Insecurity   Worried About Running Out of  Food in the Last Year: Never true   Ran Out of Food in the Last Year: Never true  Housing: Low Risk    Last Housing Risk Score: 0  Physical Activity: Not on file  Social Connections: Not on file  Stress: Not on file  Tobacco Use: High Risk   Smoking Tobacco Use: Former   Smokeless Tobacco Use: Current  Transportation Needs: No Transportation Needs   Lack of Transportation (Medical): No   Lack of Transportation (Non-Medical): No    CCM Care Plan  Allergies  Allergen Reactions   Sulfamethoxazole Rash    Medications Reviewed Today     Reviewed by Lane Hacker, [facility] (Pharmacist) on 10/21/20 at 1131  Med List Status: <None>   Medication Order Taking? Sig Documenting Provider Last Dose Status Informant  ALPRAZolam (XANAX) 0.5 MG tablet [id number] Yes Take 1 tablet by mouth twice daily as needed Lillard Anes, MD Taking Active   amLODipine-benazepril (LOTREL) 10-20 MG capsule [id number] Yes Take 1 capsule by mouth once daily Lillard Anes, MD Taking Active   atorvastatin (LIPITOR) 40 MG tablet [id number] Yes Take 1 tablet by mouth once daily Lillard Anes, MD Taking Active   BD PEN NEEDLE MICRO U/F 32G X 6 MM MISC [id number] Yes USE AS DIRECTED ONCE DAILY Lillard Anes, MD Taking Active   Blood Glucose Monitoring Suppl  (ONE TOUCH ULTRA 2) w/Device Drucie Opitz [id number]  USE TO CHECK GLUCOSE THREE TIMES DAILY [provider]  Active   Diclofenac Sodium CR 100 MG 24 hr tablet [id number]  Take 1 tablet by mouth once daily Lillard Anes, MD  Active   FARXIGA 10 MG TABS tablet [id number] Yes Take 1 tablet by mouth once daily Lillard Anes, MD Taking Active   metFORMIN (GLUCOPHAGE) 1000 MG tablet [id number] Yes Take 1 tablet by mouth twice daily Lillard Anes, MD Taking Active   sildenafil (VIAGRA) 100 MG tablet [id number] Yes Take 1 tablet (100 mg total) by mouth daily as needed for erectile dysfunction. Lillard Anes, MD Taking Active   traMADol Veatrice Bourbon) 50 MG tablet [id number] Yes TAKE 1 TABLET BY MOUTH EVERY 8 HOURS AS NEEDED FOR MODERATE PAIN Lillard Anes, MD Taking Active   TRESIBA FLEXTOUCH 100 UNIT/ML FlexTouch Pen [id number] Yes INJECT 50 UNITS SUBCUTANEOUSLY ONCE DAILY Lillard Anes, MD Taking Active   Med List Note Erie Noe, LPN 88/87/57 [id number]): Patient approved for Farxiga Patient Assistance from [place] 06/23/20 through 01/18/21            Patient Active Problem List   Diagnosis Date Noted   Pigmented nevus 04/18/2020   BMI 26.0-26.9,adult 10/04/2019   PVD (peripheral vascular disease) ([place]) 08/28/2019   Osteoarthritis of left knee 05/04/2019   Mixed hyperlipidemia 04/27/2019   Chronic kidney disease, stage 3a ([place]) 04/27/2019   Type 2 diabetes mellitus with diabetic polyneuropathy ([place]) 04/27/2019   Type 2 diabetes mellitus with stage 3 chronic kidney disease ([place]) 04/27/2019   Benign essential hypertension 04/27/2019   Osteoarthritis of right knee 04/27/2019    Immunization History  Administered Date(s) Administered   Influenza-Unspecified 11/16/2018   PFIZER Comirnaty([place])Covid-19 Tri-Sucrose Vaccine 03/30/2019, 04/25/2019   Pneumococcal Polysaccharide-23 04/24/2020    Conditions to be addressed/monitored:  Hypertension,  Hyperlipidemia, Diabetes, and Anxiety  Care Plan : [place]  Updates made by Lane Hacker, RPH since 10/21/2020 12:00 AM     Problem: DM, HTN, Mental health, Lipids   Priority: High  Onset Date: 10/21/2020     Long-Range Goal: Disease State Management   Start Date: 10/21/2020  Expected End Date: 10/21/2021  This Visit's Progress: On track  Priority: High  Note:   Current Barriers:  Does not maintain contact with provider office Does not contact provider office for questions/concerns  Pharmacist Clinical Goal(s):  Patient will contact provider office for questions/concerns as evidenced notation of same in electronic health record through collaboration with PharmD and provider.   Interventions: 1:1 collaboration with Lillard Anes, MD regarding development and update of comprehensive plan of care as evidenced by provider attestation and co-signature Inter-disciplinary care team collaboration (see longitudinal plan of care) Comprehensive medication review performed; medication list updated in electronic medical record  Hypertension (BP goal <140/90) BP Readings from Last 3 Encounters:  08/02/20 140/68  07/25/20 136/80  06/20/20 126/80  -Controlled -Current treatment: Amlodipine/Benazapril 10/60m QD -Medications previously tried: N/A  -Current home readings: Doesn't test -Current dietary habits: "Tries to eat Healthy" -Current exercise habits: Does chores around the house -Denies hypotensive/hypertensive symptoms -Educated on BP goals and benefits of medications for prevention of heart attack, stroke and kidney damage; Extensive time spent on counseling patient on blood pressure goal and impact of each antihypertensive medication on their blood pressure and risk reduction for CV disease.  Used analogies to explain the need for multiple antihypertensive medications to achieve BP goals and that it is often a silent disease with no symptoms.  -Counseled to  monitor BP at home PRN, document, and provide log at future appointments -Recommended to continue current medication  Hyperlipidemia: (LDL goal < 100) Lab Results  Component Value Date   CHOL 162 06/20/2020   CHOL 182 02/15/2020   CHOL 175 10/04/2019   Lab Results  Component Value Date   HDL 69 06/20/2020   HDL 75 02/15/2020   HDL 57 10/04/2019   Lab Results  Component Value Date   LDLCALC 81 06/20/2020   LDLCALC 93 02/15/2020   LDLCALC 102 (H) 10/04/2019   Lab Results  Component Value Date   TRIG 57 06/20/2020   TRIG 77 02/15/2020   TRIG 84 10/04/2019   Lab Results  Component Value Date   CHOLHDL 2.3 06/20/2020   CHOLHDL 2.4 02/15/2020   CHOLHDL 3.1 10/04/2019  No results found for: LDLDIRECT -Controlled -Current treatment: Atorvastatin 444m-Medications previously tried: N/A  -Current dietary habits: "Tries to eat Healthy" -Current exercise habits: Does chores around the house -Educated on Cholesterol goals;  Over 15 minutes spent counseling patient on role of hyperlipidemia on heart disease and the impact of each lipid subclass with emphasis on LDL and heart disease risk.  Explained role of statins in prevention of CV disease complications and actual risk for adverse effects with statin treatment.  Counseled patient on genetic component placing them at risk for hyperlipidemia. -Recommended to continue current medication  Diabetes (A1c goal <7%) Lab Results  Component Value Date   HGBA1C 6.7 (H) 06/20/2020   HGBA1C 6.7 (H) 02/15/2020   HGBA1C 7.5 (H) 10/04/2019   Lab Results  Component Value Date   MICROALBUR 80 05/17/2019   LDLCALC 81 06/20/2020   CREATININE 0.75 (L) 06/20/2020   Lab Results  Component Value Date   NA 142 06/20/2020   K 4.5 06/20/2020   CREATININE 0.75 (L) 06/20/2020   EGFR 97 06/20/2020   GFRNONAA 94 02/15/2020   GLUCOSE 86 06/20/2020   Lab Results  Component Value Date   WBC 9.6 06/20/2020   HGB 15.7  06/20/2020   HCT 44.8  06/20/2020   MCV 94 06/20/2020   PLT 322 06/20/2020  -Controlled -Current medications: Farxiga 10m Tresiba 30 units Metformin 10038m-Medications previously tried: N/A  -Current home glucose readings fasting glucose: Didn't have logs on him post prandial glucose: Didn't have logs on him -Denies hypoglycemic/hyperglycemic symptoms -Current meal patterns: Patient didn't answer initial question and since his labs are good, I didn't repeat myself -Current exercise: Chores -Educated on A1c and blood sugar goals; Extensive time was spent counseling patient on the pathophysiology and risk for complications with uncontrolled Diabetes.  Used teach back method for counseling on ADA diabetic diet.  Time spent on explaining role of carbohydrates versus sugars impacting blood glucose levels and meal planning. Current exercise consists of walking 10 minutes three times a week, plan is to increase to 20 minutes 5 days a week and then after 2-3 weeks increase to 30 minute 5 days a week.  -Counseled to check feet daily and get yearly eye exams -Recommended to continue current medication  Depression/Anxiety -Patient has lost 2 of his sons, said, "If I didn't take this medicine my mind would just race and I wouldn't be able to focus or sleep or anything" -Controlled -Current treatment: Alprazolam BID -Medications previously tried/failed: N/A -PHQ9:  [facility] 06/20/2020 04/24/2020 05/17/2019  Decreased Interest 0 0 0  Down, Depressed, Hopeless 0 0 0  PHQ - 2 Score 0 0 0  -GAD7: No flowsheet data found. -Educated on Benefits of medication for symptom control October 2022: Patient could try preventative therapy (SSRI/SNRI/Etc) so he doesn't have to take BNZ as often, patient wasn't open to changing any meds today.  Pain Controlled Pain Scale: Without Meds  -October 2022: 8/10 With Meds: -October 2022: 3-4/10 (Is able to do chores, just is slower) -Current medications: Tramadol (Takes  BID always, not PRN) Diclofenac -Medications previously tried: N/A Plan: At goal,  patient stable/ symptoms controlled    Patient Goals/Self-Care Activities Patient will:  - take medications as prescribed  Follow Up Plan: Face to Face appointment with care management team member scheduled for:  April 2023       Medication Assistance: None required.  Patient affirms current coverage meets needs. Notes say patient needs PAP but patient denied any PAP needs  Compliance/Adherence/Medication fill history: Star Rating Drugs:  Amlodipine-benazepril 10-20 mg- Last filled 09-24-2020 90 DS Atorvastatin 40 mg- Last filled 07-21-2020 90 DS Metformin 1000 mg- Last filled 08-19-2020 90 DS Farxiga 10 mg- Last filled      Care Gaps: Last annual wellness visit? 04-24-2020 Last eye exam / retinopathy screening? 07-01-2020 Last diabetic foot exam? Hep C screening overdue Tdap overdue Shingrix overdue Colonoscopy overdue Flu vaccine overdue last completed 11-16-2018 Covid 19 pfizer series 3 overdue last completed 04-25-2019  [facility]  [facility] [place]hone: [phone]: [phone] pill box? No - Doesn't think he needs Pt endorses 100% compliance  Care Plan and Follow Up Patient Decision:  Patient agrees to Care Plan and Follow-up.  Plan: The patient has been provided with contact information for the care management team and has been advised to call with any health related questions or concerns.   [place] ConstablePharm.D. - [phone]  F/U: April 2023

## 2020-10-21 NOTE — Chronic Care Management (AMB) (Addendum)
Chronic Care Management Pharmacy Assistant   Name: Clayton James  MRN: [id number] DOB: January 27, 1950   Reason for Encounter: Chart review for CPP visit on 10-21-2020   Conditions to be addressed/monitored: HTN, HLD, DMII, and CKD Stage 3a   Recent office visits:  09-11-2020 Lillard Anes, MD. START Molnupiravir 200 mg take 4 capsules twice daily for 5 days.  08-02-2020 Lillard Anes, MD. Referral placed to orthopedics.  07-25-2020 Rochel Brome, MD. Order placed for HM diabetes eye exam. Kenalog injection given.  06-20-2020 Lillard Anes, MD. START Viagra 100 mg as needed. A1C= 6.7. Neutrophils absolute= 7.2. Creatinine = 0.75.  05-03-2020 Lillard Anes, MD. Referral placed to dermatology.  04-24-2020 Erie Noe, LPN. Medicare wellness visit. Cologuard = positive.  04-19-2020 Lillard Anes, MD. Steroid injection of right knee.  Recent consult visits:  07-01-2020 Ishmael Holter, MD (optometry). Unable to view encounter  Hospital visits:  None in previous 6 months  Medications: Outpatient Encounter Medications as of 10/21/2020  Medication Sig   ALPRAZolam (XANAX) 0.5 MG tablet Take 1 tablet by mouth twice daily as needed   amLODipine-benazepril (LOTREL) 10-20 MG capsule Take 1 capsule by mouth once daily   atorvastatin (LIPITOR) 40 MG tablet Take 1 tablet by mouth once daily   BD PEN NEEDLE MICRO U/F 32G X 6 MM MISC USE AS DIRECTED ONCE DAILY   Blood Glucose Monitoring Suppl (ONE TOUCH ULTRA 2) w/Device KIT USE TO CHECK GLUCOSE THREE TIMES DAILY   Diclofenac Sodium CR 100 MG 24 hr tablet Take 1 tablet by mouth once daily   FARXIGA 10 MG TABS tablet Take 1 tablet by mouth once daily   metFORMIN (GLUCOPHAGE) 1000 MG tablet Take 1 tablet by mouth twice daily   sildenafil (VIAGRA) 100 MG tablet Take 1 tablet (100 mg total) by mouth daily as needed for erectile dysfunction.   traMADol (ULTRAM) 50 MG tablet TAKE 1 TABLET BY MOUTH  EVERY 8 HOURS AS NEEDED FOR MODERATE PAIN   TRESIBA FLEXTOUCH 100 UNIT/ML FlexTouch Pen INJECT 30 UNITS SUBCUTANEOUSLY ONCE DAILY   No facility-administered encounter medications on file as of 10/21/2020.    Lab Results  Component Value Date/Time   HGBA1C 6.7 (H) 06/20/2020 09:37 AM   HGBA1C 6.7 (H) 02/15/2020 10:01 AM   MICROALBUR 80 05/17/2019 09:03 AM     BP Readings from Last 3 Encounters:  08/02/20 140/68  07/25/20 136/80  06/20/20 126/80      Have you seen any other providers since your last visit with PCP?  Any changes in your medications or health?   Any side effects from any medications?   Do you have an symptoms or problems not managed by your medications?  Any concerns about your health right now?   Has your provider asked that you check blood pressure, blood sugar, or follow special diet at home?   Do you get any type of exercise on a regular basis?  Can you think of a goal you would like to reach for your health?   Do you have any problems getting your medications?  Is there anything that you would like to discuss during the appointment?    10-21-2020: Left [name] a voicemail for initial questions for telephone appointment with [place] Constable, PharmD at 10:30   Star Rating Drugs:  Amlodipine-benazepril 10-20 mg- Last filled 09-24-2020 90 DS Atorvastatin 40 mg- Last filled 07-21-2020 90 DS Metformin 1000 mg- Last filled 08-19-2020 90 DS Farxiga 10 mg- Last  filled    Care Gaps: Last annual wellness visit? 04-24-2020 Last eye exam / retinopathy screening? 07-01-2020 Last diabetic foot exam? Hep C screening overdue Tdap overdue Shingrix overdue Colonoscopy overdue Flu vaccine overdue last completed 11-16-2018 Covid 19 pfizer series 3 overdue last completed 04-25-2019   [place] Pharmacist Assistant [phone]

## 2020-10-21 NOTE — Patient Instructions (Signed)
Visit Information   Goals Addressed             This Visit's Progress    Manage My Medicine       Timeframe:  Long-Range Goal Priority:  High Start Date:                             Expected End Date:                       Follow Up Date 10/21/21   - call if I am sick and can't take my medicine - keep a list of all the medicines I take; vitamins and herbals too    Why is this important?   These steps will help you keep on track with your medicines.   Notes:      Set My Target A1C-Diabetes Type 2       Timeframe:  Long-Range Goal Priority:  High Start Date:                             Expected End Date:                       Follow Up Date 10/21/21   - set target A1C    Why is this important?   Your target A1C is decided together by you and your doctor.  It is based on several things like your age and other health issues.    Notes:        Patient Care Plan: CCM Pharmacy Care Plan     Problem Identified: DM, HTN, Mental health, Lipids   Priority: High  Onset Date: 10/21/2020     Long-Range Goal: Disease State Management   Start Date: 10/21/2020  Expected End Date: 10/21/2021  This Visit's Progress: On track  Priority: High  Note:   Current Barriers:  Does not maintain contact with provider office Does not contact provider office for questions/concerns  Pharmacist Clinical Goal(s):  Patient will contact provider office for questions/concerns as evidenced notation of same in electronic health record through collaboration with PharmD and provider.   Interventions: 1:1 collaboration with Lillard Anes, MD regarding development and update of comprehensive plan of care as evidenced by provider attestation and co-signature Inter-disciplinary care team collaboration (see longitudinal plan of care) Comprehensive medication review performed; medication list updated in electronic medical record  Hypertension (BP goal <140/90) BP Readings from Last 3  Encounters:  08/02/20 140/68  07/25/20 136/80  06/20/20 126/80  -Controlled -Current treatment: Amlodipine/Benazapril 10/25m QD -Medications previously tried: N/A  -Current home readings: Doesn't test -Current dietary habits: "Tries to eat Healthy" -Current exercise habits: Does chores around the house -Denies hypotensive/hypertensive symptoms -Educated on BP goals and benefits of medications for prevention of heart attack, stroke and kidney damage; Extensive time spent on counseling patient on blood pressure goal and impact of each antihypertensive medication on their blood pressure and risk reduction for CV disease.  Used analogies to explain the need for multiple antihypertensive medications to achieve BP goals and that it is often a silent disease with no symptoms.  -Counseled to monitor BP at home PRN, document, and provide log at future appointments -Recommended to continue current medication  Hyperlipidemia: (LDL goal < 100) Lab Results  Component Value Date   CHOL 162 06/20/2020   CHOL 182 02/15/2020  CHOL 175 10/04/2019   Lab Results  Component Value Date   HDL 69 06/20/2020   HDL 75 02/15/2020   HDL 57 10/04/2019   Lab Results  Component Value Date   LDLCALC 81 06/20/2020   LDLCALC 93 02/15/2020   LDLCALC 102 (H) 10/04/2019   Lab Results  Component Value Date   TRIG 57 06/20/2020   TRIG 77 02/15/2020   TRIG 84 10/04/2019   Lab Results  Component Value Date   CHOLHDL 2.3 06/20/2020   CHOLHDL 2.4 02/15/2020   CHOLHDL 3.1 10/04/2019  No results found for: LDLDIRECT -Controlled -Current treatment: Atorvastatin 37m -Medications previously tried: N/A  -Current dietary habits: "Tries to eat Healthy" -Current exercise habits: Does chores around the house -Educated on Cholesterol goals;  Over 15 minutes spent counseling patient on role of hyperlipidemia on heart disease and the impact of each lipid subclass with emphasis on LDL and heart disease risk.   Explained role of statins in prevention of CV disease complications and actual risk for adverse effects with statin treatment.  Counseled patient on genetic component placing them at risk for hyperlipidemia. -Recommended to continue current medication  Diabetes (A1c goal <7%) Lab Results  Component Value Date   HGBA1C 6.7 (H) 06/20/2020   HGBA1C 6.7 (H) 02/15/2020   HGBA1C 7.5 (H) 10/04/2019   Lab Results  Component Value Date   MICROALBUR 80 05/17/2019   LDLCALC 81 06/20/2020   CREATININE 0.75 (L) 06/20/2020   Lab Results  Component Value Date   NA 142 06/20/2020   K 4.5 06/20/2020   CREATININE 0.75 (L) 06/20/2020   EGFR 97 06/20/2020   GFRNONAA 94 02/15/2020   GLUCOSE 86 06/20/2020   Lab Results  Component Value Date   WBC 9.6 06/20/2020   HGB 15.7 06/20/2020   HCT 44.8 06/20/2020   MCV 94 06/20/2020   PLT 322 06/20/2020  -Controlled -Current medications: Farxiga 131mTresiba 30 units Metformin 100037mMedications previously tried: N/A  -Current home glucose readings fasting glucose: Didn't have logs on him post prandial glucose: Didn't have logs on him -Denies hypoglycemic/hyperglycemic symptoms -Current meal patterns: Patient didn't answer initial question and since his labs are good, I didn't repeat myself -Current exercise: Chores -Educated on A1c and blood sugar goals; Extensive time was spent counseling patient on the pathophysiology and risk for complications with uncontrolled Diabetes.  Used teach back method for counseling on ADA diabetic diet.  Time spent on explaining role of carbohydrates versus sugars impacting blood glucose levels and meal planning. Current exercise consists of walking 10 minutes three times a week, plan is to increase to 20 minutes 5 days a week and then after 2-3 weeks increase to 30 minute 5 days a week.  -Counseled to check feet daily and get yearly eye exams -Recommended to continue current medication  Depression/Anxiety -Patient  has lost 2 of his sons, said, "If I didn't take this medicine my mind would just race and I wouldn't be able to focus or sleep or anything" -Controlled -Current treatment: Alprazolam BID -Medications previously tried/failed: N/A -PHQ9:  [facility] 06/20/2020 04/24/2020 05/17/2019  Decreased Interest 0 0 0  Down, Depressed, Hopeless 0 0 0  PHQ - 2 Score 0 0 0  -GAD7: No flowsheet data found. -Educated on Benefits of medication for symptom control October 2022: Patient could try preventative therapy (SSRI/SNRI/Etc) so he doesn't have to take BNZ as often, patient wasn't open to changing any meds today.  Pain Controlled Pain Scale:  Without Meds  -October 2022: 8/10 With Meds: -October 2022: 3-4/10 (Is able to do chores, just is slower) -Current medications: Tramadol (Takes BID always, not PRN) Diclofenac -Medications previously tried: N/A Plan: At goal,  patient stable/ symptoms controlled    Patient Goals/Self-Care Activities Patient will:  - take medications as prescribed  Follow Up Plan: Face to Face appointment with care management team member scheduled for:  April 2023      Mr. Kroening was given information about Chronic Care Management services today including:  CCM service includes personalized support from designated clinical staff supervised by his physician, including individualized plan of care and coordination with other care providers 24/7 contact phone numbers for assistance for urgent and routine care needs. Standard insurance, coinsurance, copays and deductibles apply for chronic care management only during months in which we provide at least 20 minutes of these services. Most insurances cover these services at 100%, however patients may be responsible for any copay, coinsurance and/or deductible if applicable. This service may help you avoid the need for more expensive face-to-face services. Only one practitioner may furnish and bill the service in a  calendar month. The patient may stop CCM services at any time (effective at the end of the month) by phone call to the office staff.  Patient agreed to services and verbal consent obtained.   The patient verbalized understanding of instructions, educational materials, and care plan provided today and declined offer to receive copy of patient instructions, educational materials, and care plan.  The pharmacy team will reach out to the patient again over the next 90 days.   Lane Hacker, [facility]

## 2020-10-22 ENCOUNTER — Other Ambulatory Visit: Payer: Self-pay | Admitting: Legal Medicine

## 2020-10-22 DIAGNOSIS — M170 Bilateral primary osteoarthritis of knee: Secondary | ICD-10-CM | POA: Diagnosis not present

## 2020-10-22 DIAGNOSIS — M25561 Pain in right knee: Secondary | ICD-10-CM | POA: Diagnosis not present

## 2020-10-22 DIAGNOSIS — M1712 Unilateral primary osteoarthritis, left knee: Secondary | ICD-10-CM

## 2020-10-22 DIAGNOSIS — M25562 Pain in left knee: Secondary | ICD-10-CM | POA: Diagnosis not present

## 2020-10-24 DIAGNOSIS — K621 Rectal polyp: Secondary | ICD-10-CM | POA: Diagnosis not present

## 2020-10-24 DIAGNOSIS — E119 Type 2 diabetes mellitus without complications: Secondary | ICD-10-CM | POA: Diagnosis not present

## 2020-10-24 DIAGNOSIS — K5730 Diverticulosis of large intestine without perforation or abscess without bleeding: Secondary | ICD-10-CM | POA: Diagnosis not present

## 2020-10-24 DIAGNOSIS — R195 Other fecal abnormalities: Secondary | ICD-10-CM | POA: Diagnosis not present

## 2020-10-24 DIAGNOSIS — K648 Other hemorrhoids: Secondary | ICD-10-CM | POA: Diagnosis not present

## 2020-10-24 DIAGNOSIS — K644 Residual hemorrhoidal skin tags: Secondary | ICD-10-CM | POA: Diagnosis not present

## 2020-10-24 DIAGNOSIS — D124 Benign neoplasm of descending colon: Secondary | ICD-10-CM | POA: Diagnosis not present

## 2020-10-24 DIAGNOSIS — I10 Essential (primary) hypertension: Secondary | ICD-10-CM | POA: Diagnosis not present

## 2020-10-24 DIAGNOSIS — D126 Benign neoplasm of colon, unspecified: Secondary | ICD-10-CM | POA: Diagnosis not present

## 2020-10-24 DIAGNOSIS — D128 Benign neoplasm of rectum: Secondary | ICD-10-CM | POA: Diagnosis not present

## 2020-10-24 DIAGNOSIS — K921 Melena: Secondary | ICD-10-CM | POA: Diagnosis not present

## 2020-10-24 LAB — HM COLONOSCOPY

## 2020-10-30 DIAGNOSIS — M170 Bilateral primary osteoarthritis of knee: Secondary | ICD-10-CM | POA: Diagnosis not present

## 2020-10-30 DIAGNOSIS — M25561 Pain in right knee: Secondary | ICD-10-CM | POA: Diagnosis not present

## 2020-10-30 DIAGNOSIS — M25562 Pain in left knee: Secondary | ICD-10-CM | POA: Diagnosis not present

## 2020-11-06 ENCOUNTER — Other Ambulatory Visit: Payer: Self-pay | Admitting: Family Medicine

## 2020-11-06 DIAGNOSIS — M25561 Pain in right knee: Secondary | ICD-10-CM | POA: Diagnosis not present

## 2020-11-06 DIAGNOSIS — M170 Bilateral primary osteoarthritis of knee: Secondary | ICD-10-CM | POA: Diagnosis not present

## 2020-11-06 DIAGNOSIS — M1712 Unilateral primary osteoarthritis, left knee: Secondary | ICD-10-CM

## 2020-11-06 DIAGNOSIS — M25562 Pain in left knee: Secondary | ICD-10-CM | POA: Diagnosis not present

## 2020-11-18 DIAGNOSIS — E1142 Type 2 diabetes mellitus with diabetic polyneuropathy: Secondary | ICD-10-CM

## 2020-11-18 DIAGNOSIS — I10 Essential (primary) hypertension: Secondary | ICD-10-CM

## 2020-11-18 DIAGNOSIS — E782 Mixed hyperlipidemia: Secondary | ICD-10-CM | POA: Diagnosis not present

## 2020-11-21 ENCOUNTER — Other Ambulatory Visit: Payer: Self-pay | Admitting: Family Medicine

## 2020-11-21 DIAGNOSIS — M1712 Unilateral primary osteoarthritis, left knee: Secondary | ICD-10-CM

## 2020-11-22 ENCOUNTER — Ambulatory Visit: Payer: Medicare HMO | Admitting: Legal Medicine

## 2020-11-26 ENCOUNTER — Ambulatory Visit: Payer: Medicare HMO | Admitting: Legal Medicine

## 2020-11-28 ENCOUNTER — Other Ambulatory Visit: Payer: Self-pay | Admitting: Legal Medicine

## 2020-11-28 DIAGNOSIS — E1142 Type 2 diabetes mellitus with diabetic polyneuropathy: Secondary | ICD-10-CM

## 2020-12-02 ENCOUNTER — Ambulatory Visit (INDEPENDENT_AMBULATORY_CARE_PROVIDER_SITE_OTHER): Payer: Medicare HMO | Admitting: Legal Medicine

## 2020-12-02 ENCOUNTER — Other Ambulatory Visit: Payer: Self-pay

## 2020-12-02 ENCOUNTER — Encounter: Payer: Self-pay | Admitting: Legal Medicine

## 2020-12-02 VITALS — BP 130/62 | HR 72 | Temp 97.2°F | Ht 70.0 in | Wt 181.0 lb

## 2020-12-02 DIAGNOSIS — N1831 Chronic kidney disease, stage 3a: Secondary | ICD-10-CM

## 2020-12-02 DIAGNOSIS — I10 Essential (primary) hypertension: Secondary | ICD-10-CM | POA: Diagnosis not present

## 2020-12-02 DIAGNOSIS — Z23 Encounter for immunization: Secondary | ICD-10-CM | POA: Diagnosis not present

## 2020-12-02 DIAGNOSIS — E1122 Type 2 diabetes mellitus with diabetic chronic kidney disease: Secondary | ICD-10-CM | POA: Diagnosis not present

## 2020-12-02 DIAGNOSIS — M1711 Unilateral primary osteoarthritis, right knee: Secondary | ICD-10-CM

## 2020-12-02 DIAGNOSIS — S46011D Strain of muscle(s) and tendon(s) of the rotator cuff of right shoulder, subsequent encounter: Secondary | ICD-10-CM | POA: Diagnosis not present

## 2020-12-02 DIAGNOSIS — E1142 Type 2 diabetes mellitus with diabetic polyneuropathy: Secondary | ICD-10-CM

## 2020-12-02 DIAGNOSIS — E782 Mixed hyperlipidemia: Secondary | ICD-10-CM

## 2020-12-02 DIAGNOSIS — M75101 Unspecified rotator cuff tear or rupture of right shoulder, not specified as traumatic: Secondary | ICD-10-CM | POA: Insufficient documentation

## 2020-12-02 MED ORDER — BD PEN NEEDLE MICRO U/F 32G X 6 MM MISC: 4 refills | Status: DC

## 2020-12-02 NOTE — Progress Notes (Signed)
Established Patient Office Visit  Subjective:  Patient ID: Clayton James, male    DOB: Mar 10, 1950  Age: 70 y.o. MRN: [id number]  CC:  Chief Complaint  Patient presents with   Diabetes   Hyperlipidemia   Hypertension    HPI Clayton James presents for chronic visit.  He till has knee pain despite flexogenics visits.  Still in pain.  Considering a tKA.  He is having shoulder pain.  Bilat, neck pain.  Has rotator cuff tear- no repair right.  Patient present with type 2 diabetes.  Specifically, this is type 2, noninsulin requiring diabetes, complicated by neuropathy and nephropathy.  Compliance with treatment has been good; patient take medicines as directed, maintains diet and exercise regimen, follows up as directed, and is keeping glucose diary.  Date of  diagnosis 2010.  Depression screen has been performed.Tobacco screen nonsmoker. Current medicines for diabetes metformin, tresiba, farxiga.  Patient is on benazeprilfor renal protection and atorvastatin for cholesterol control.  Patient performs foot exams daily and last ophthalmologic exam was has eye exam.   Patient presents for follow up of hypertension.  Patient tolerating amlodipine and benazelrin well with side effects.  Patient was diagnosed with hypertension 2010 so has been treated for hypertension for 12 years.Patient is working on maintaining diet and exercise regimen and follows up as directed. Complication include none.   Patient presents with hyperlipidemia.  Compliance with treatment has been good; patient takes medicines as directed, maintains low cholesterol diet, follows up as directed, and maintains exercise regimen.  Patient is using atorvastatin without problems.   Past Medical History:  Diagnosis Date   Benign essential hypertension 04/27/2019   Chronic kidney disease, stage 3a ([place]) 04/27/2019   Mixed hyperlipidemia 04/27/2019   Type 2 diabetes mellitus with diabetic polyneuropathy ([place]) 04/27/2019   Type 2 diabetes  mellitus with stage 3 chronic kidney disease ([place]) 04/27/2019    Past Surgical History:  Procedure Laterality Date   ELBOW SURGERY Left 1994   KNEE ARTHROSCOPY Right 1998   KNEE CARTILAGE SURGERY Left 1994    Family History  Problem Relation Age of Onset   Diabetes Mother    Kidney disease Mother    Heart disease Father     Social History   Socioeconomic History   Marital status: Married    Spouse name: Not on file   Number of children: 5   Years of education: Not on file   Highest education level: Not on file  Occupational History   Not on file  Tobacco Use   Smoking status: Former    Years: 8.00    Types: Cigarettes    Quit date: 2000    Years since quitting: 22.8   Smokeless tobacco: Current    Types: Chew   Tobacco comments:    1 can per week  Vaping Use   Vaping Use: Never used  Substance and Sexual Activity   Alcohol use: Yes    Alcohol/week: 1.0 standard drink    Types: 1 Cans of beer per week    Comment: one drink per dayd   Drug use: Never   Sexual activity: Yes    Partners: Female  Other Topics Concern   Not on file  Social History Narrative   Not on file   Social Determinants of Health   Financial Resource Strain: Low Risk    Difficulty of Paying Living Expenses: Not hard at all  Food Insecurity: No Food Insecurity   Worried About Charity fundraiser  in the Last Year: Never true   [place] in the Last Year: Never true  Transportation Needs: No Transportation Needs   Lack of Transportation (Medical): No   Lack of Transportation (Non-Medical): No  Physical Activity: Not on file  Stress: Not on file  Social Connections: Not on file  Intimate Partner Violence: Not At Risk   Fear of Current or Ex-Partner: No   Emotionally Abused: No   Physically Abused: No   Sexually Abused: No    Outpatient Medications Prior to Visit  Medication Sig Dispense Refill   ALPRAZolam (XANAX) 0.5 MG tablet Take 1 tablet (0.5 mg total) by mouth 2 (two)  times daily as needed. 60 tablet 1   amLODipine-benazepril (LOTREL) 10-20 MG capsule Take 1 capsule by mouth once daily 90 capsule 2   atorvastatin (LIPITOR) 40 MG tablet Take 1 tablet by mouth once daily 90 tablet 2   Blood Glucose Monitoring Suppl (ONE TOUCH ULTRA 2) w/Device KIT USE TO CHECK GLUCOSE THREE TIMES DAILY     Diclofenac Sodium CR 100 MG 24 hr tablet Take 1 tablet by mouth once daily 90 tablet 2   FARXIGA 10 MG TABS tablet Take 1 tablet by mouth once daily 90 tablet 2   metFORMIN (GLUCOPHAGE) 1000 MG tablet Take 1 tablet by mouth twice daily 180 tablet 2   sildenafil (VIAGRA) 100 MG tablet Take 1 tablet (100 mg total) by mouth daily as needed for erectile dysfunction. 10 tablet 3   traMADol (ULTRAM) 50 MG tablet TAKE 1 TABLET BY MOUTH EVERY 8 HOURS AS NEEDED FOR MODERATE PAIN 30 tablet 2   TRESIBA FLEXTOUCH 100 UNIT/ML FlexTouch Pen INJECT 30 UNITS SUBCUTANEOUSLY ONCE DAILY 15 mL 6   BD PEN NEEDLE MICRO U/F 32G X 6 MM MISC USE AS DIRECTED ONCE DAILY 100 each 4   No facility-administered medications prior to visit.    Allergies  Allergen Reactions   Sulfamethoxazole Rash    ROS Review of Systems  Constitutional:  Negative for activity change and appetite change.  HENT:  Negative for congestion.   Eyes:  Negative for visual disturbance.  Respiratory:  Negative for chest tightness and shortness of breath.   Cardiovascular:  Negative for chest pain, palpitations and leg swelling.  Gastrointestinal:  Negative for abdominal distention and abdominal pain.  Genitourinary:  Negative for difficulty urinating and dysuria.  Musculoskeletal:  Negative for arthralgias and back pain.       Bilateral shoulder pain  Neurological: Negative.   Psychiatric/Behavioral: Negative.       Objective:    Physical Exam Vitals reviewed.  Constitutional:      Appearance: Normal appearance. He is obese.  HENT:     Right Ear: Tympanic membrane, ear canal and external ear normal.     Left  Ear: Tympanic membrane, ear canal and external ear normal.     Nose: Nose normal.     Mouth/Throat:     Mouth: Mucous membranes are moist.  Eyes:     Conjunctiva/sclera: Conjunctivae normal.     Pupils: Pupils are equal, round, and reactive to light.  Cardiovascular:     Rate and Rhythm: Normal rate and regular rhythm.     Pulses: Normal pulses.     Heart sounds: Normal heart sounds. No murmur heard.   No gallop.  Pulmonary:     Effort: Pulmonary effort is normal. No respiratory distress.     Breath sounds: Normal breath sounds. No wheezing.  Abdominal:  General: Abdomen is flat. Bowel sounds are normal. There is no distension.     Palpations: Abdomen is soft.     Tenderness: There is no abdominal tenderness.  Musculoskeletal:     Cervical back: Normal range of motion.     Right lower leg: No edema.     Left lower leg: No edema.     Comments: Pain and limited ROM both shoulders, abduction 40 degrees  Skin:    General: Skin is warm.     Capillary Refill: Capillary refill takes less than 2 seconds.  Neurological:     General: No focal deficit present.     Mental Status: He is alert and oriented to person, place, and time. Mental status is at baseline.     Sensory: Sensory deficit (feet) present.    BP 130/62   Pulse 72   Temp (!) 97.2 F (36.2 C)   Ht 5' 10"  (1.778 m)   Wt 181 lb (82.1 kg)   SpO2 99%   BMI 25.97 kg/m  Wt Readings from Last 3 Encounters:  12/02/20 181 lb (82.1 kg)  09/11/20 185 lb (83.9 kg)  08/02/20 185 lb (83.9 kg)     Health Maintenance Due  Topic Date Due   Hepatitis C Screening  Never done   TETANUS/TDAP  Never done   Zoster Vaccines- Shingrix (1 of 2) Never done   COLONOSCOPY (Pts 45-70yr Insurance coverage will need to be confirmed)  Never done    There are no preventive care reminders to display for this patient.  No results found for: TSH Lab Results  Component Value Date   WBC 8.2 12/02/2020   HGB 16.2 12/02/2020   HCT 46.6  12/02/2020   MCV 91 12/02/2020   PLT 332 12/02/2020   Lab Results  Component Value Date   NA 142 12/02/2020   K 3.9 12/02/2020   CO2 21 12/02/2020   GLUCOSE 122 (H) 12/02/2020   BUN 16 12/02/2020   CREATININE 0.72 (L) 12/02/2020   BILITOT 0.8 12/02/2020   ALKPHOS 73 12/02/2020   AST 15 12/02/2020   ALT 15 12/02/2020   PROT 6.5 12/02/2020   ALBUMIN 4.5 12/02/2020   CALCIUM 9.6 12/02/2020   EGFR 98 12/02/2020   Lab Results  Component Value Date   CHOL 193 12/02/2020   Lab Results  Component Value Date   HDL 65 12/02/2020   Lab Results  Component Value Date   LDLCALC 108 (H) 12/02/2020   Lab Results  Component Value Date   TRIG 113 12/02/2020   Lab Results  Component Value Date   CHOLHDL 3.0 12/02/2020   Lab Results  Component Value Date   HGBA1C 6.3 (H) 12/02/2020      Assessment & Plan:   Problem List Items Addressed This Visit       Cardiovascular and Mediastinum   Benign essential hypertension   Relevant Orders   CBC with Differential/Platelet (Completed)   Comprehensive metabolic panel (Completed) An individual hypertension care plan was established and reinforced today.  The patient's status was assessed using clinical findings on exam and labs or diagnostic tests. The patient's success at meeting treatment goals on disease specific evidence-based guidelines and found to be well controlled. SELF MANAGEMENT: The patient and I together assessed ways to personally work towards obtaining the recommended goals. RECOMMENDATIONS: avoid decongestants found in common cold remedies, decrease consumption of alcohol, perform routine monitoring of BP with home BP cuff, exercise, reduction of dietary salt, take medicines as  prescribed, try not to miss doses and quit smoking.  Regular exercise and maintaining a healthy weight is needed.  Stress reduction may help. A CLINICAL SUMMARY including written plan identify barriers to care unique to individual due to social or  financial issues.  We attempt to mutually creat solutions for individual and family understanding.      Endocrine   Type 2 diabetes mellitus with diabetic polyneuropathy (HCC)   Relevant Medications   Insulin Pen Needle (BD PEN NEEDLE MICRO U/F) 32G X 6 MM MISC An individual care plan for diabetes was established and reinforced today.  The patient's status was assessed using clinical findings on exam, labs and diagnostic testing. Patient success at meeting goals based on disease specific evidence-based guidelines and found to be good controlled. Medications were assessed and patient's understanding of the medical issues , including barriers were assessed. Recommend adherence to a diabetic diet, a graduated exercise program, HgbA1c level is checked quarterly, and urine microalbumin performed yearly .  Annual mono-filament sensation testing performed. Lower blood pressure and control hyperlipidemia is important. Get annual eye exams and annual flu shots and smoking cessation discussed.  Self management goals were discussed.    Type 2 diabetes mellitus with stage 3 chronic kidney disease (HCC)   Relevant Orders   Hemoglobin A1c (Completed) An individual care plan for diabetes was established and reinforced today.  The patient's status was assessed using clinical findings on exam, labs and diagnostic testing. Patient success at meeting goals based on disease specific evidence-based guidelines and found to be good controlled. Medications were assessed and patient's understanding of the medical issues , including barriers were assessed. Recommend adherence to a diabetic diet, a graduated exercise program, HgbA1c level is checked quarterly, and urine microalbumin performed yearly .  Annual mono-filament sensation testing performed. Lower blood pressure and control hyperlipidemia is important. Get annual eye exams and annual flu shots and smoking cessation discussed.  Self management goals were discussed.       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  The patient's status was assessed using clinical findings on exam, lab and other diagnostic tests. The patient's disease status was assessed based on evidence-based guidelines and found to be well controlled. MEDICATIONS were reviewed. SELF MANAGEMENT GOALS have been discussed and patient's success at attaining the goal of low cholesterol was assessed. RECOMMENDATION given include regular exercise 3 days a week and low cholesterol/low fat diet. CLINICAL SUMMARY including written plan to identify barriers unique to the patient due to social or economic  reasons was discussed.    Other Visit Diagnoses     Need for immunization against influenza    -  Primary   Relevant Orders   Flu Vaccine QUAD High Dose(Fluad) (Completed)       Meds ordered this encounter  Medications   Insulin Pen Needle (BD PEN NEEDLE MICRO U/F) 32G X 6 MM MISC    Sig: USE AS DIRECTED ONCE DAILY    Dispense:  100 each    Refill:  4    Follow-up: Return in about 4 months (around 04/01/2021) for fasting.    Reinaldo Meeker, MD

## 2020-12-03 ENCOUNTER — Encounter: Payer: Self-pay | Admitting: Legal Medicine

## 2020-12-03 LAB — CBC WITH DIFFERENTIAL/PLATELET
Basophils Absolute: 0.1 10*3/uL (ref 0.0–0.2)
Basos: 1 %
EOS (ABSOLUTE): 0.2 10*3/uL (ref 0.0–0.4)
Eos: 2 %
Hematocrit: 46.6 % (ref 37.5–51.0)
Hemoglobin: 16.2 g/dL (ref 13.0–17.7)
Immature Grans (Abs): 0.1 10*3/uL (ref 0.0–0.1)
Immature Granulocytes: 1 %
Lymphocytes Absolute: 2.2 10*3/uL (ref 0.7–3.1)
Lymphs: 27 %
MCH: 31.8 pg (ref 26.6–33.0)
MCHC: 34.8 g/dL (ref 31.5–35.7)
MCV: 91 fL (ref 79–97)
Monocytes Absolute: 0.6 10*3/uL (ref 0.1–0.9)
Monocytes: 7 %
Neutrophils Absolute: 5.1 10*3/uL (ref 1.4–7.0)
Neutrophils: 62 %
Platelets: 332 10*3/uL (ref 150–450)
RBC: 5.1 x10E6/uL (ref 4.14–5.80)
RDW: 12.8 % (ref 11.6–15.4)
WBC: 8.2 10*3/uL (ref 3.4–10.8)

## 2020-12-03 LAB — LIPID PANEL
Chol/HDL Ratio: 3 ratio (ref 0.0–5.0)
Cholesterol, Total: 193 mg/dL (ref 100–199)
HDL: 65 mg/dL (ref >39)
LDL Chol Calc (NIH): 108 mg/dL — ABNORMAL HIGH (ref 0–99)
Triglycerides: 113 mg/dL (ref 0–149)
VLDL Cholesterol Cal: 20 mg/dL (ref 5–40)

## 2020-12-03 LAB — COMPREHENSIVE METABOLIC PANEL
ALT: 15 IU/L (ref 0–44)
AST: 15 IU/L (ref 0–40)
Albumin/Globulin Ratio: 2.3 — ABNORMAL HIGH (ref 1.2–2.2)
Albumin: 4.5 g/dL (ref 3.8–4.8)
Alkaline Phosphatase: 73 IU/L (ref 44–121)
BUN/Creatinine Ratio: 22 (ref 10–24)
BUN: 16 mg/dL (ref 8–27)
Bilirubin Total: 0.8 mg/dL (ref 0.0–1.2)
CO2: 21 mmol/L (ref 20–29)
Calcium: 9.6 mg/dL (ref 8.6–10.2)
Chloride: 101 mmol/L (ref 96–106)
Creatinine, Ser: 0.72 mg/dL — ABNORMAL LOW (ref 0.76–1.27)
Globulin, Total: 2 g/dL (ref 1.5–4.5)
Glucose: 122 mg/dL — ABNORMAL HIGH (ref 70–99)
Potassium: 3.9 mmol/L (ref 3.5–5.2)
Sodium: 142 mmol/L (ref 134–144)
Total Protein: 6.5 g/dL (ref 6.0–8.5)
eGFR: 98 mL/min/{1.73_m2} (ref >59)

## 2020-12-03 LAB — HEMOGLOBIN A1C
Est. average glucose Bld gHb Est-mCnc: 134 mg/dL
Hgb A1c MFr Bld: 6.3 % — ABNORMAL HIGH (ref 4.8–5.6)

## 2020-12-03 LAB — CARDIOVASCULAR RISK ASSESSMENT

## 2020-12-03 NOTE — Progress Notes (Signed)
Blood count normal.  Liver function normal.  Kidney function normal.  Cholesterol: LDL little worsened. Continue current dose of lipitor. Low fat diet recommended.  HBA1C: 6.3. improved.

## 2020-12-21 ENCOUNTER — Other Ambulatory Visit: Payer: Self-pay | Admitting: Family Medicine

## 2020-12-21 DIAGNOSIS — F419 Anxiety disorder, unspecified: Secondary | ICD-10-CM

## 2020-12-26 ENCOUNTER — Telehealth: Payer: Self-pay

## 2020-12-26 NOTE — Chronic Care Management (AMB) (Signed)
Chronic Care Management Pharmacy Assistant   Name: Clayton James  MRN: [id number] DOB: 01-Feb-1950   Reason for Encounter: Disease State/ Diabetes  Recent office visits:  12-02-2020 Lillard Anes, MD. Glucose= 122, Creatinine= 0.72, Albumin/globulin= 2.3. A1C= 6.3. LDL= 108. Flu vaccine given.  Recent consult visits:  None  Hospital visits:  None in previous 6 months  Medications: Outpatient Encounter Medications as of 12/26/2020  Medication Sig   ALPRAZolam (XANAX) 0.5 MG tablet Take 1 tablet by mouth twice daily as needed   amLODipine-benazepril (LOTREL) 10-20 MG capsule Take 1 capsule by mouth once daily   atorvastatin (LIPITOR) 40 MG tablet Take 1 tablet by mouth once daily   Blood Glucose Monitoring Suppl (ONE TOUCH ULTRA 2) w/Device KIT USE TO CHECK GLUCOSE THREE TIMES DAILY   Diclofenac Sodium CR 100 MG 24 hr tablet Take 1 tablet by mouth once daily   FARXIGA 10 MG TABS tablet Take 1 tablet by mouth once daily   Insulin Pen Needle (BD PEN NEEDLE MICRO U/F) 32G X 6 MM MISC USE AS DIRECTED ONCE DAILY   metFORMIN (GLUCOPHAGE) 1000 MG tablet Take 1 tablet by mouth twice daily   sildenafil (VIAGRA) 100 MG tablet Take 1 tablet (100 mg total) by mouth daily as needed for erectile dysfunction.   traMADol (ULTRAM) 50 MG tablet TAKE 1 TABLET BY MOUTH EVERY 8 HOURS AS NEEDED FOR MODERATE PAIN   TRESIBA FLEXTOUCH 100 UNIT/ML FlexTouch Pen INJECT 30 UNITS SUBCUTANEOUSLY ONCE DAILY   No facility-administered encounter medications on file as of 12/26/2020.  Recent Relevant Labs: Lab Results  Component Value Date/Time   HGBA1C 6.3 (H) 12/02/2020 11:17 AM   HGBA1C 6.7 (H) 06/20/2020 09:37 AM   MICROALBUR 80 05/17/2019 09:03 AM    Kidney Function Lab Results  Component Value Date/Time   CREATININE 0.72 (L) 12/02/2020 11:17 AM   CREATININE 0.75 (L) 06/20/2020 09:37 AM   GFRNONAA 94 02/15/2020 10:01 AM   GFRAA 109 02/15/2020 10:01 AM     Current antihyperglycemic  regimen:  Farxiga 10 mg daily Tresiba 30 units daily Metformin 1000 mg twice daily   Patient verbally confirms he is taking the above medications as directed. Yes  What recent interventions/DTPs have been made to improve glycemic control:  Educated on A1c and blood sugar goals; Extensive time was spent counseling patient on the pathophysiology and risk for complications with uncontrolled Diabetes.  Used teach back method for counseling on ADA diabetic diet.  Time spent on explaining role of carbohydrates versus sugars impacting blood glucose levels and meal planning. Current exercise consists of walking 10 minutes three times a week, plan is to increase to 20 minutes 5 days a week and then after 2-3 weeks increase to 30 minute 5 days a week.  -Counseled to check feet daily and get yearly eye exams  Have there been any recent hospitalizations or ED visits since last visit with CPP? No  Patient denies hypoglycemic symptoms  Patient denies hyperglycemic symptoms  How often are you checking your blood sugar? once daily  What are your blood sugars ranging?  Fasting: 102, 105 Before meals: None After meals: None Bedtime: None  On insulin? Yes How many units: 30  During the week, how often does your blood glucose drop below 70? Never  Are you checking your feet daily/regularly? Yes  Adherence Review: Is the patient currently on a STATIN medication? Yes Is the patient currently on ACE/ARB medication? Yes Does the patient have >5 day gap  between last estimated fill dates? CPP to review   Care Gaps: Last eye exam / Retinopathy Screening? 07-01-2020 Last Annual Wellness Visit? 04-24-2020 Last Diabetic Foot Exam? Patient states Dr. Henrene Pastor examines feet Hep C screening overdue Tdap overdue Shingrix overdue Colonoscopy overdue  Star Rating Drugs: Farxiga 10 mg- Patient assistance Metformin 1000 mg- Last filled 11-28-2020 90 DS Atorvastatin 40 mg- Last filled 11-28-2020 90  DS Amlodipine-benazepril 10-20 mg- Last filled 09-24-2020 90 DS (Patient states he has 10 pills left)  [place] Clinical Pharmacist Assistant [phone]

## 2021-01-03 ENCOUNTER — Other Ambulatory Visit: Payer: Self-pay | Admitting: Legal Medicine

## 2021-01-03 DIAGNOSIS — M1712 Unilateral primary osteoarthritis, left knee: Secondary | ICD-10-CM

## 2021-01-08 ENCOUNTER — Other Ambulatory Visit: Payer: Self-pay | Admitting: Legal Medicine

## 2021-01-08 DIAGNOSIS — I10 Essential (primary) hypertension: Secondary | ICD-10-CM

## 2021-02-10 ENCOUNTER — Telehealth: Payer: Self-pay

## 2021-02-10 NOTE — Chronic Care Management (AMB) (Signed)
Chronic Care Management Pharmacy Assistant   Name: Clayton James  MRN: [id number] DOB: 1950/05/16   Reason for Encounter: Disease State/ Diabetes  Recent office visits:  None  Recent consult visits:  None  Hospital visits:  None in previous 6 months  Medications: Outpatient Encounter Medications as of 02/10/2021  Medication Sig   ALPRAZolam (XANAX) 0.5 MG tablet Take 1 tablet by mouth twice daily as needed   amLODipine-benazepril (LOTREL) 10-20 MG capsule Take 1 capsule by mouth once daily   atorvastatin (LIPITOR) 40 MG tablet Take 1 tablet by mouth once daily   Blood Glucose Monitoring Suppl (ONE TOUCH ULTRA 2) w/Device KIT USE TO CHECK GLUCOSE THREE TIMES DAILY   Diclofenac Sodium CR 100 MG 24 hr tablet Take 1 tablet by mouth once daily   FARXIGA 10 MG TABS tablet Take 1 tablet by mouth once daily   Insulin Pen Needle (BD PEN NEEDLE MICRO U/F) 32G X 6 MM MISC USE AS DIRECTED ONCE DAILY   metFORMIN (GLUCOPHAGE) 1000 MG tablet Take 1 tablet by mouth twice daily   sildenafil (VIAGRA) 100 MG tablet Take 1 tablet (100 mg total) by mouth daily as needed for erectile dysfunction.   traMADol (ULTRAM) 50 MG tablet TAKE 1 TABLET BY MOUTH EVERY 8 HOURS AS NEEDED FOR MODERATE PAIN   TRESIBA FLEXTOUCH 100 UNIT/ML FlexTouch Pen INJECT 30 UNITS SUBCUTANEOUSLY ONCE DAILY   No facility-administered encounter medications on file as of 02/10/2021.   Recent Relevant Labs: Lab Results  Component Value Date/Time   HGBA1C 6.3 (H) 12/02/2020 11:17 AM   HGBA1C 6.7 (H) 06/20/2020 09:37 AM   MICROALBUR 80 05/17/2019 09:03 AM    Kidney Function Lab Results  Component Value Date/Time   CREATININE 0.72 (L) 12/02/2020 11:17 AM   CREATININE 0.75 (L) 06/20/2020 09:37 AM   GFRNONAA 94 02/15/2020 10:01 AM   GFRAA 109 02/15/2020 10:01 AM     Current antihyperglycemic regimen:  Farxiga 10 mg daily Tresiba 30 units daily Metformin 1000 mg twice daily   Patient verbally confirms he is taking the  above medications as directed. Yes  What recent interventions/DTPs have been made to improve glycemic control:  Educated on A1c and blood sugar goals; Extensive time was spent counseling patient on the pathophysiology and risk for complications with uncontrolled Diabetes.  Used teach back method for counseling on ADA diabetic diet.  Time spent on explaining role of carbohydrates versus sugars impacting blood glucose levels and meal planning. Current exercise consists of walking 10 minutes three times a week, plan is to increase to 20 minutes 5 days a week and then after 2-3 weeks increase to 30 minute 5 days a week.  -Counseled to check feet daily and get yearly eye exams  Have there been any recent hospitalizations or ED visits since last visit with CPP? No  Patient denies hypoglycemic symptoms  Patient denies hyperglycemic symptoms  How often are you checking your blood sugar? once daily  What are your blood sugars ranging?  Fasting: 115, 80 Before meals: None After meals: None Bedtime: [place]  On insulin? Yes How many units: 30  During the week, how often does your blood glucose drop below 70? Never  Are you checking your feet daily/regularly? Yes  Adherence Review: Is the patient currently on a STATIN medication? Yes Is the patient currently on ACE/ARB medication? Yes Does the patient have >5 day gap between last estimated fill dates? CPP to review  Care Gaps: Last eye exam / Retinopathy  Screening? 07-01-2020 Last Annual Wellness Visit? 04-24-2020 Last Diabetic Foot Exam? Patient states Dr. Henrene Pastor examines feet.  Star Rating Drugs: Farxiga 10 mg- Patient assistance Metformin 1000 mg- Last filled 11-28-2020 90 DS Atorvastatin 40 mg- Last filled 11-28-2020 90 DS Amlodipine-benazepril 10-20 mg- Last filled 01-08-2021 90 DS   [place] Clinical Pharmacist Assistant [phone]

## 2021-02-12 DIAGNOSIS — M25561 Pain in right knee: Secondary | ICD-10-CM | POA: Diagnosis not present

## 2021-02-12 DIAGNOSIS — M25562 Pain in left knee: Secondary | ICD-10-CM | POA: Diagnosis not present

## 2021-02-12 DIAGNOSIS — M170 Bilateral primary osteoarthritis of knee: Secondary | ICD-10-CM | POA: Diagnosis not present

## 2021-02-27 ENCOUNTER — Other Ambulatory Visit: Payer: Self-pay | Admitting: Legal Medicine

## 2021-02-27 DIAGNOSIS — M1712 Unilateral primary osteoarthritis, left knee: Secondary | ICD-10-CM

## 2021-03-11 DIAGNOSIS — M25561 Pain in right knee: Secondary | ICD-10-CM | POA: Diagnosis not present

## 2021-03-11 DIAGNOSIS — I10 Essential (primary) hypertension: Secondary | ICD-10-CM | POA: Diagnosis not present

## 2021-03-11 DIAGNOSIS — M170 Bilateral primary osteoarthritis of knee: Secondary | ICD-10-CM | POA: Diagnosis not present

## 2021-03-20 ENCOUNTER — Ambulatory Visit (INDEPENDENT_AMBULATORY_CARE_PROVIDER_SITE_OTHER): Payer: Medicare HMO | Admitting: Legal Medicine

## 2021-03-20 ENCOUNTER — Encounter: Payer: Self-pay | Admitting: Legal Medicine

## 2021-03-20 ENCOUNTER — Other Ambulatory Visit: Payer: Self-pay

## 2021-03-20 VITALS — BP 140/86 | HR 99 | Temp 97.9°F | Resp 15 | Ht 70.0 in | Wt 187.0 lb

## 2021-03-20 DIAGNOSIS — N401 Enlarged prostate with lower urinary tract symptoms: Secondary | ICD-10-CM | POA: Diagnosis not present

## 2021-03-20 DIAGNOSIS — N1831 Chronic kidney disease, stage 3a: Secondary | ICD-10-CM | POA: Diagnosis not present

## 2021-03-20 DIAGNOSIS — R350 Frequency of micturition: Secondary | ICD-10-CM | POA: Diagnosis not present

## 2021-03-20 DIAGNOSIS — M1711 Unilateral primary osteoarthritis, right knee: Secondary | ICD-10-CM | POA: Diagnosis not present

## 2021-03-20 DIAGNOSIS — N400 Enlarged prostate without lower urinary tract symptoms: Secondary | ICD-10-CM | POA: Insufficient documentation

## 2021-03-20 DIAGNOSIS — E1142 Type 2 diabetes mellitus with diabetic polyneuropathy: Secondary | ICD-10-CM | POA: Diagnosis not present

## 2021-03-20 DIAGNOSIS — I739 Peripheral vascular disease, unspecified: Secondary | ICD-10-CM | POA: Diagnosis not present

## 2021-03-20 DIAGNOSIS — M1712 Unilateral primary osteoarthritis, left knee: Secondary | ICD-10-CM | POA: Diagnosis not present

## 2021-03-20 DIAGNOSIS — E782 Mixed hyperlipidemia: Secondary | ICD-10-CM | POA: Diagnosis not present

## 2021-03-20 DIAGNOSIS — Z6826 Body mass index (BMI) 26.0-26.9, adult: Secondary | ICD-10-CM

## 2021-03-20 DIAGNOSIS — E1122 Type 2 diabetes mellitus with diabetic chronic kidney disease: Secondary | ICD-10-CM | POA: Diagnosis not present

## 2021-03-20 DIAGNOSIS — N138 Other obstructive and reflux uropathy: Secondary | ICD-10-CM | POA: Insufficient documentation

## 2021-03-20 LAB — POCT URINALYSIS DIP (CLINITEK)
Bilirubin, UA: NEGATIVE
Blood, UA: NEGATIVE
Glucose, UA: >=1000 mg/dL — AB
Ketones, POC UA: NEGATIVE mg/dL
Leukocytes, UA: NEGATIVE
Nitrite, UA: NEGATIVE
Spec Grav, UA: 1.015 (ref 1.010–1.025)
Urobilinogen, UA: 0.2 E.U./dL
pH, UA: 6 (ref 5.0–8.0)

## 2021-03-20 MED ORDER — TAMSULOSIN HCL 0.4 MG PO CAPS: 0.4000 mg | ORAL_CAPSULE | Freq: Every day | ORAL | 3 refills | Status: DC

## 2021-03-20 MED ORDER — ACETAMINOPHEN-CODEINE #3 300-30 MG PO TABS: 2.0000 | ORAL_TABLET | ORAL | 3 refills | Status: DC | PRN

## 2021-03-20 NOTE — Progress Notes (Signed)
Subjective:  Patient ID: Clayton James, male    DOB: May 05, 1950  Age: 71 y.o. MRN: [id number]  Chief Complaint  Patient presents with   Knee Pain   Urinary Frequency    HPI Patient mentioned that pain on both knees are getting worse. He is going to [place] to have shot on both knee but he wants to see orthopedic and probably to have knees replacement. He has stage 4 OA knees,  Needs evaluation for TKA.  He stated that since one week ago, he notice frequency urination and some abdominal pain. He denied any fever, chills, headache. He is on farxiga. Current Outpatient Medications on File Prior to Visit  Medication Sig Dispense Refill   ALPRAZolam (XANAX) 0.5 MG tablet Take 1 tablet by mouth twice daily as needed 60 tablet 3   amLODipine-benazepril (LOTREL) 10-20 MG capsule Take 1 capsule by mouth once daily 90 capsule 2   atorvastatin (LIPITOR) 40 MG tablet Take 1 tablet by mouth once daily 90 tablet 2   Blood Glucose Monitoring Suppl (ONE TOUCH ULTRA 2) w/Device KIT USE TO CHECK GLUCOSE THREE TIMES DAILY     Diclofenac Sodium CR 100 MG 24 hr tablet Take 1 tablet by mouth once daily 90 tablet 2   FARXIGA 10 MG TABS tablet Take 1 tablet by mouth once daily 90 tablet 2   Insulin Pen Needle (BD PEN NEEDLE MICRO U/F) 32G X 6 MM MISC USE AS DIRECTED ONCE DAILY 100 each 4   metFORMIN (GLUCOPHAGE) 1000 MG tablet Take 1 tablet by mouth twice daily 180 tablet 2   sildenafil (VIAGRA) 100 MG tablet Take 1 tablet (100 mg total) by mouth daily as needed for erectile dysfunction. 10 tablet 3   traMADol (ULTRAM) 50 MG tablet TAKE 1 TABLET BY MOUTH EVERY 8 HOURS AS NEEDED FOR MODERATE PAIN 30 tablet 3   TRESIBA FLEXTOUCH 100 UNIT/ML FlexTouch Pen INJECT 30 UNITS SUBCUTANEOUSLY ONCE DAILY 15 mL 6   No current facility-administered medications on file prior to visit.   Past Medical History:  Diagnosis Date   Benign essential hypertension 04/27/2019   Chronic kidney disease, stage 3a ([place]) 04/27/2019    Mixed hyperlipidemia 04/27/2019   Type 2 diabetes mellitus with diabetic polyneuropathy ([place]) 04/27/2019   Type 2 diabetes mellitus with stage 3 chronic kidney disease ([place]) 04/27/2019   Past Surgical History:  Procedure Laterality Date   ELBOW SURGERY Left 1994   KNEE ARTHROSCOPY Right 1998   KNEE CARTILAGE SURGERY Left 1994    Family History  Problem Relation Age of Onset   Diabetes Mother    Kidney disease Mother    Heart disease Father    Social History   Socioeconomic History   Marital status: Married    Spouse name: Not on file   Number of children: 5   Years of education: Not on file   Highest education level: Not on file  Occupational History   Not on file  Tobacco Use   Smoking status: Former    Years: 8.00    Types: Cigarettes    Quit date: 2000    Years since quitting: 23.1   Smokeless tobacco: Current    Types: Chew   Tobacco comments:    1 can per week  Vaping Use   Vaping Use: Never used  Substance and Sexual Activity   Alcohol use: Yes    Alcohol/week: 1.0 standard drink    Types: 1 Cans of beer per week    Comment:  one drink per dayd   Drug use: Never   Sexual activity: Yes    Partners: Female  Other Topics Concern   Not on file  Social History Narrative   Not on file   Social Determinants of Health   Financial Resource Strain: Low Risk    Difficulty of Paying Living Expenses: Not hard at all  Food Insecurity: No Food Insecurity   Worried About Charity fundraiser in the Last Year: Never true   [place] in the Last Year: Never true  Transportation Needs: No Transportation Needs   Lack of Transportation (Medical): No   Lack of Transportation (Non-Medical): No  Physical Activity: Not on file  Stress: Not on file  Social Connections: Not on file    Review of Systems  Constitutional:  Negative for chills, fatigue, fever and unexpected weight change.  HENT:  Negative for congestion, ear pain, sinus pain and sore throat.   Respiratory:   Negative for cough and shortness of breath.   Cardiovascular:  Negative for chest pain and palpitations.  Gastrointestinal:  Negative for abdominal pain, blood in stool, constipation, diarrhea, nausea and vomiting.  Endocrine: Negative for polydipsia.  Genitourinary:  Positive for frequency. Negative for dysuria.  Musculoskeletal:  Positive for arthralgias (Both Knee pain). Negative for back pain.  Skin:  Negative for rash.  Neurological:  Negative for headaches.    Objective:  BP 140/86    Pulse 99    Temp 97.9 F (36.6 C)    Resp 15    Ht _0  (1.778 m)    Wt 187 lb (84.8 kg)    SpO2 96%    BMI 26.83 kg/m   BP/Weight 03/20/2021 12/02/2020 7/32/2025  Systolic BP 427 062 -  Diastolic BP 86 62 -  Wt. (Lbs) 187 181 185  BMI 26.83 25.97 26.54    Physical Exam Vitals reviewed.  Constitutional:      General: He is not in acute distress.    Appearance: Normal appearance.  HENT:     Right Ear: Tympanic membrane normal.  Cardiovascular:     Rate and Rhythm: Normal rate and regular rhythm.     Pulses: Normal pulses.     Heart sounds: Normal heart sounds. No murmur heard.   No gallop.  Pulmonary:     Effort: Pulmonary effort is normal. No respiratory distress.     Breath sounds: Normal breath sounds. No wheezing.  Musculoskeletal:        General: Tenderness present.     Cervical back: Normal range of motion.     Right lower leg: No edema.     Left lower leg: No edema.     Comments: OA both knees with crepitation, negative Lachman and McMurray  Skin:    General: Skin is warm.     Capillary Refill: Capillary refill takes less than 2 seconds.  Neurological:     General: No focal deficit present.     Mental Status: He is alert and oriented to person, place, and time. Mental status is at baseline.     Gait: Gait abnormal.     Deep Tendon Reflexes: Reflexes normal.    Diabetic Foot Exam - Simple   Simple Foot Form Diabetic Foot exam was performed with the following findings:  Yes 03/20/2021  4:29 PM  Visual Inspection See comments: Yes Sensation Testing See comments: Yes Pulse Check Posterior Tibialis and Dorsalis pulse intact bilaterally: Yes Comments Bunion, decreased light touch  Lab Results  Component Value Date   WBC 8.2 12/02/2020   HGB 16.2 12/02/2020   HCT 46.6 12/02/2020   PLT 332 12/02/2020   GLUCOSE 122 (H) 12/02/2020   CHOL 193 12/02/2020   TRIG 113 12/02/2020   HDL 65 12/02/2020   LDLCALC 108 (H) 12/02/2020   ALT 15 12/02/2020   AST 15 12/02/2020   NA 142 12/02/2020   K 3.9 12/02/2020   CL 101 12/02/2020   CREATININE 0.72 (L) 12/02/2020   BUN 16 12/02/2020   CO2 21 12/02/2020   HGBA1C 6.3 (H) 12/02/2020   MICROALBUR 80 05/17/2019      Assessment & Plan:   Problem List Items Addressed This Visit       Cardiovascular and Mediastinum   PVD (peripheral vascular disease) ([place]) Patient has some PVD but no recent claudication     Endocrine   Type 2 diabetes mellitus with diabetic polyneuropathy ([place]) An individual care plan for diabetes was established and reinforced today.  The patient's status was assessed using clinical findings on exam, labs and diagnostic testing. Patient success at meeting goals based on disease specific evidence-based guidelines and found to be good controlled. Medications were assessed and patient's understanding of the medical issues , including barriers were assessed. Recommend adherence to a diabetic diet, a graduated exercise program, HgbA1c level is checked quarterly, and urine microalbumin performed yearly .  Annual mono-filament sensation testing performed. Lower blood pressure and control hyperlipidemia is important. Get annual eye exams and annual flu shots and smoking cessation discussed.  Self management goals were discussed.     Type 2 diabetes mellitus with stage 3 chronic kidney disease ([place]) An individual care plan for diabetes was established and reinforced today.  The patient's status  was assessed using clinical findings on exam, labs and diagnostic testing. Patient success at meeting goals based on disease specific evidence-based guidelines and found to be good controlled. Medications were assessed and patient's understanding of the medical issues , including barriers were assessed. Recommend adherence to a diabetic diet, a graduated exercise program, HgbA1c level is checked quarterly, and urine microalbumin performed yearly .  Annual mono-filament sensation testing performed. Lower blood pressure and control hyperlipidemia is important. Get annual eye exams and annual flu shots and smoking cessation discussed.  Self management goals were discussed.       Musculoskeletal and Integument   Osteoarthritis of right knee - Primary   Relevant Medications   acetaminophen-codeine (TYLENOL #3) 300-30 MG tablet   Other Relevant Orders   Ambulatory referral to Orthopedic Surgery Severe OA both knees failed conservative therapy    Osteoarthritis of left knee   Relevant Medications   acetaminophen-codeine (TYLENOL #3) 300-30 MG tablet   Other Relevant Orders   Ambulatory referral to Orthopedic Surgery Failed conservative therapy     Genitourinary   Chronic kidney disease, stage 3a ([place]) Patient was evaluated for 3a.  It is important to maintain good Blood Pressure control and diabetes. Keep on low protein diet and remain with adequate hydration to maintain function.     BPH (benign prostatic hyperplasia)   Relevant Medications   tamsulosin (FLOMAX) 0.4 MG CAPS capsule AN INDIVIDUAL CARE PLAN for BPH was established and reinforced today.  The patient's status was assessed using clinical findings on exam, labs, and other diagnostic testing. Patient's success at meeting treatment goals based on disease specific evidence-bassed guidelines and found to be in good control. RECOMMENDATIONS include maintaining present medicines and treatment.  Other   Mixed hyperlipidemia AN  INDIVIDUAL CARE PLAN for hyperlipidemia/ cholesterol was established and reinforced today.  The patient's status was assessed using clinical findings on exam, lab and other diagnostic tests. The patient's disease status was assessed based on evidence-based guidelines and found to be fair controlled. MEDICATIONS were renewed. SELF MANAGEMENT GOALS have been discussed and patient's success at attaining the goal of low cholesterol was assessed. RECOMMENDATION given include regular exercise 3 days a week and low cholesterol/low fat diet. CLINICAL SUMMARY including written plan to identify barriers unique to the patient due to social or economic  reasons was discussed.     BMI 26.0-26.9,adult Continue diet and exercise   Other Visit Diagnoses     Frequency of urination       Relevant Orders   POCT URINALYSIS DIP (CLINITEK) (Completed) Urinaylsis ok, glucoseuria from [place]     .  Meds ordered this encounter  Medications   acetaminophen-codeine (TYLENOL #3) 300-30 MG tablet    Sig: Take 2 tablets by mouth every 4 (four) hours as needed for moderate pain.    Dispense:  60 tablet    Refill:  3   tamsulosin (FLOMAX) 0.4 MG CAPS capsule    Sig: Take 1 capsule (0.4 mg total) by mouth daily.    Dispense:  30 capsule    Refill:  3    Orders Placed This Encounter  Procedures   Ambulatory referral to Orthopedic Surgery   POCT URINALYSIS DIP (CLINITEK)     Follow-up: Return in about 4 months (around 07/20/2021) for fasting.  An After Visit Summary was printed and given to the patient.  Reinaldo Meeker, MD [facility] [phone]

## 2021-03-24 ENCOUNTER — Ambulatory Visit (INDEPENDENT_AMBULATORY_CARE_PROVIDER_SITE_OTHER): Payer: Medicare HMO | Admitting: Physician Assistant

## 2021-03-24 ENCOUNTER — Encounter: Payer: Self-pay | Admitting: Physician Assistant

## 2021-03-24 ENCOUNTER — Ambulatory Visit: Payer: Self-pay

## 2021-03-24 ENCOUNTER — Other Ambulatory Visit: Payer: Self-pay

## 2021-03-24 ENCOUNTER — Ambulatory Visit (INDEPENDENT_AMBULATORY_CARE_PROVIDER_SITE_OTHER): Payer: Medicare HMO

## 2021-03-24 DIAGNOSIS — M25561 Pain in right knee: Secondary | ICD-10-CM

## 2021-03-24 DIAGNOSIS — G8929 Other chronic pain: Secondary | ICD-10-CM

## 2021-03-24 DIAGNOSIS — M25562 Pain in left knee: Secondary | ICD-10-CM

## 2021-03-24 NOTE — Progress Notes (Addendum)
? ?Office Visit Note ?  ?Patient: Clayton James           ?Date of Birth: Jun 01, 1950           ?MRN: [id number] ?Visit Date: 03/24/2021 ?             ?Requested by: Lillard Anes, MD ?[address] ?[address] ?[place],  [place] [address] ?PCP: Lillard Anes, MD ? ?Chief Complaint  ?Patient presents with  ? Right Knee - Pain  ? Left Knee - Pain  ? ? ? ? ?HPI: ?Patient is a pleasant 71 year old gentleman with a chief complaint of bilateral knee pain right greater than left.  He has been very active his whole life and played baseball softball and boxing.  He enjoys hunting.  He has had increasing pain in his knees.  Before the pandemic he had considered knee replacement but obviously this was canceled.  He has tried steroid shots which does give him a small amount of relief for short period of time.  He has tried gel injections.  He recently tried flexogenix which actually made his knees worse.  He has difficulty walking.  He has a history of bilateral knee arthroscopies many years ago.  He is also tried anti-inflammatories this is now a quality-of-life issue for him.  His medical history is significant for type 2 diabetes last A1C 3 months ago was 6.3. H ehas a BMI of 27. ? ?Assessment & Plan: ?Visit Diagnoses:  ?1. Chronic pain of both knees   ? ? ?Plan: Advanced arthritis bilateral knees.  Varus malalignment.  Patient has failed conservative treatment including multiple steroid injections and gel injections anti-inflammatories.  My suspicion is he had meniscectomies done many many years ago which have contributed to this problem.  He also has CPPD.  This is now quality-of-life issue for him.  He is interested in going forward with a knee replacement he understands we will not be both at the same time.  He has a full physical scheduled with his primary care provider in mid April.  He would like to do that and have surgery anytime after that.  I did refer him today to Dr. Erlinda Hong and answered all of his  questions. ? ?Follow-Up Instructions: No follow-ups on file.  ? ?Ortho Exam ? ?Patient is alert, oriented, no adenopathy, well-dressed, normal affect, normal respiratory effort. ?Examination bilateral knees.  No effusion no redness.  Both knees have varus malalignment.  He is globally tender over medial lateral joint lines.  Range of motion is especially painful for him.  He has no patellar mobility. ? ?Imaging: ?XR KNEE 3 VIEW LEFT ? ?Result Date: 03/24/2021 ?Perhaps the left reviewed today.  End-stage osteoarthritis of all 3 compartments most noted with bone-on-bone arthritis and varus malalignment in the medial compartment.  No acute osseous changes ? ?XR KNEE 3 VIEW RIGHT ? ?Result Date: 03/24/2021 ?Radiographs of the right knee were reviewed today.  Advanced degenerative arthritis of all 3 compartments.  Significant arthritis bone-on-bone especially in the medial compartment with varus malalignment no acute osseous changes  ?No images are attached to the encounter. ? ?Labs: ?Lab Results  ?Component Value Date  ? HGBA1C 6.3 (H) 12/02/2020  ? HGBA1C 6.7 (H) 06/20/2020  ? HGBA1C 6.7 (H) 02/15/2020  ? ? ? ?Lab Results  ?Component Value Date  ? ALBUMIN 4.5 12/02/2020  ? ALBUMIN 4.3 06/20/2020  ? ALBUMIN 4.4 06/20/2020  ? ? ?No results found for: MG ?No results found for:  VD25OH ? ?No results found for: PREALBUMIN ?CBC EXTENDED Latest Ref Rng & Units 12/02/2020 06/20/2020 02/15/2020  ?WBC 3.4 - 10.8 x10E3/uL 8.2 9.6 6.7  ?RBC 4.14 - 5.80 x10E6/uL 5.10 4.79 5.10  ?HGB 13.0 - 17.7 g/dL 16.2 15.7 16.2  ?HCT 37.5 - 51.0 % 46.6 44.8 47.3  ?PLT 150 - 450 x10E3/uL 332 322 288  ?NEUTROABS 1.4 - 7.0 x10E3/uL 5.1 7.2(H) 4.1  ?LYMPHSABS 0.7 - 3.1 x10E3/uL 2.2 1.5 1.6  ? ? ? ?There is no height or weight on file to calculate BMI. ? ?Orders:  ?Orders Placed This Encounter  ?Procedures  ? XR KNEE 3 VIEW RIGHT  ? XR KNEE 3 VIEW LEFT  ? ?No orders of the defined types were placed in this encounter. ? ? ? Procedures: ?No procedures  performed ? ?Clinical Data: ?No additional findings. ? ?ROS: ? ?All other systems negative, except as noted in the HPI. ?Review of Systems ? ?Objective: ?Vital Signs: There were no vitals taken for this visit. ? ?Specialty Comments:  ?No specialty comments available. ? ?PMFS History: ?Patient Active Problem List  ? Diagnosis Date Noted  ? BPH (benign prostatic hyperplasia) 03/20/2021  ? Rotator cuff tear, right 12/02/2020  ? Pigmented nevus 04/18/2020  ? BMI 26.0-26.9,adult 10/04/2019  ? PVD (peripheral vascular disease) ([place]) 08/28/2019  ? Osteoarthritis of left knee 05/04/2019  ? Mixed hyperlipidemia 04/27/2019  ? Chronic kidney disease, stage 3a ([place]) 04/27/2019  ? Type 2 diabetes mellitus with diabetic polyneuropathy ([place]) 04/27/2019  ? Type 2 diabetes mellitus with stage 3 chronic kidney disease ([place]) 04/27/2019  ? Benign essential hypertension 04/27/2019  ? Osteoarthritis of right knee 04/27/2019  ? ?Past Medical History:  ?Diagnosis Date  ? Benign essential hypertension 04/27/2019  ? Chronic kidney disease, stage 3a ([place]) 04/27/2019  ? Mixed hyperlipidemia 04/27/2019  ? Type 2 diabetes mellitus with diabetic polyneuropathy ([place]) 04/27/2019  ? Type 2 diabetes mellitus with stage 3 chronic kidney disease ([place]) 04/27/2019  ?  ?Family History  ?Problem Relation Age of Onset  ? Diabetes Mother   ? Kidney disease Mother   ? Heart disease Father   ?  ?Past Surgical History:  ?Procedure Laterality Date  ? ELBOW SURGERY Left 1994  ? KNEE ARTHROSCOPY Right 1998  ? KNEE CARTILAGE SURGERY Left 1994  ? ?Social History  ? ?Occupational History  ? Not on file  ?Tobacco Use  ? Smoking status: Former  ?  Years: 8.00  ?  Types: Cigarettes  ?  Quit date: 2000  ?  Years since quitting: 23.1  ? Smokeless tobacco: Current  ?  Types: Chew  ? Tobacco comments:  ?  1 can per week  ?Vaping Use  ? Vaping Use: Never used  ?Substance and Sexual Activity  ? Alcohol use: Yes  ?  Alcohol/week: 1.0 standard drink  ?  Types: 1 Cans of beer per week  ?   Comment: one drink per dayd  ? Drug use: Never  ? Sexual activity: Yes  ?  Partners: Female  ? ? ? ? ? ?

## 2021-03-26 ENCOUNTER — Encounter: Payer: Self-pay | Admitting: Orthopaedic Surgery

## 2021-03-26 ENCOUNTER — Other Ambulatory Visit: Payer: Self-pay

## 2021-03-26 ENCOUNTER — Ambulatory Visit: Payer: Medicare HMO | Admitting: Orthopaedic Surgery

## 2021-03-26 VITALS — Ht 69.5 in | Wt 188.0 lb

## 2021-03-26 DIAGNOSIS — M1712 Unilateral primary osteoarthritis, left knee: Secondary | ICD-10-CM

## 2021-03-26 DIAGNOSIS — M1711 Unilateral primary osteoarthritis, right knee: Secondary | ICD-10-CM | POA: Insufficient documentation

## 2021-03-26 NOTE — Progress Notes (Signed)
? ?Office Visit Note ?  ?Patient: Clayton James           ?Date of Birth: 1950-05-23           ?MRN: [id number] ?Visit Date: 03/26/2021 ?             ?Requested by: Clayton Anes, MD ?[address] ?[address] ?[place],  [place] [address] ?PCP: Clayton Anes, MD ? ? ?Assessment & Plan: ?Visit Diagnoses:  ?1. Primary osteoarthritis of right knee   ?2. Primary osteoarthritis of left knee   ? ? ?Plan: Impression is advanced varus DJD of bilateral knees.  Clinically he is doing worse on the right side.  Based on his options he is elected to move forward with a right total knee replacement in the near future.  Denies nickel allergy or history of DVT.  He is unable to even walk to the mailbox.  He is unable to hunt.  He is fearful of falling constantly.  Risk benefits rehab recovery reviewed with the patient detail.  We will obtain preoperative medical clearance from Dr. Henrene James.  Clayton James will call the patient to schedule surgery. ? ?Follow-Up Instructions: No follow-ups on file.  ? ?Orders:  ?No orders of the defined types were placed in this encounter. ? ?No orders of the defined types were placed in this encounter. ? ? ? ? Procedures: ?No procedures performed ? ? ?Clinical Data: ?No additional findings. ? ? ?Subjective: ?Chief Complaint  ?Patient presents with  ? Right Knee - Pain  ? Left Knee - Pain  ? ? ?HPI ? ?Clayton James is a very pleasant 71 year old gentleman referral from [facility] for chronic and severe bilateral knee pain worse on the right.  He has undergone extensive conservative management for end-stage and advanced DJD over the last several years including physical therapy, cortisone injections, gel injections, stem cell therapy and PRP injections at [facility] which cost him over thousand dollars without any relief.  He takes tramadol and ibuprofen on a daily basis to help dull the pain.  He has experienced a significant decline in quality of life and ability to perform ADLs.  He is unable to  sleep at night. ? ?Review of Systems ? ? ?Objective: ?Vital Signs: Ht 5' 9.5" (1.765 m)   Wt 188 lb (85.3 kg)   BMI 27.36 kg/m?  ? ?Physical Exam ? ?Ortho Exam ? ?Examination of bilateral knees show varus alignment.  Trace effusion.  Significant pain and crepitus throughout the arc of motion.  Collaterals and cruciates are grossly intact. ? ?Specialty Comments:  ?No specialty comments available. ? ?Imaging: ?No results found. ? ? ?PMFS History: ?Patient Active Problem List  ? Diagnosis Date Noted  ? Primary osteoarthritis of left knee 03/26/2021  ? Primary osteoarthritis of right knee 03/26/2021  ? BPH (benign prostatic hyperplasia) 03/20/2021  ? Rotator cuff tear, right 12/02/2020  ? Pigmented nevus 04/18/2020  ? BMI 26.0-26.9,adult 10/04/2019  ? PVD (peripheral vascular disease) ([place]) 08/28/2019  ? Osteoarthritis of left knee 05/04/2019  ? Mixed hyperlipidemia 04/27/2019  ? Chronic kidney disease, stage 3a ([place]) 04/27/2019  ? Type 2 diabetes mellitus with diabetic polyneuropathy ([place]) 04/27/2019  ? Type 2 diabetes mellitus with stage 3 chronic kidney disease ([place]) 04/27/2019  ? Benign essential hypertension 04/27/2019  ? Osteoarthritis of right knee 04/27/2019  ? ?Past Medical History:  ?Diagnosis Date  ? Benign essential hypertension 04/27/2019  ? Chronic kidney disease, stage 3a ([place]) 04/27/2019  ? Mixed hyperlipidemia 04/27/2019  ?  Type 2 diabetes mellitus with diabetic polyneuropathy ([place]) 04/27/2019  ? Type 2 diabetes mellitus with stage 3 chronic kidney disease ([place]) 04/27/2019  ?  ?Family History  ?Problem Relation Age of Onset  ? Diabetes Mother   ? Kidney disease Mother   ? Heart disease Father   ?  ?Past Surgical History:  ?Procedure Laterality Date  ? ELBOW SURGERY Left 1994  ? KNEE ARTHROSCOPY Right 1998  ? KNEE CARTILAGE SURGERY Left 1994  ? ?Social History  ? ?Occupational History  ? Not on file  ?Tobacco Use  ? Smoking status: Former  ?  Years: 8.00  ?  Types: Cigarettes  ?  Quit date: 2000  ?  Years since  quitting: 23.1  ? Smokeless tobacco: Current  ?  Types: Chew  ? Tobacco comments:  ?  1 can per week  ?Vaping Use  ? Vaping Use: Never used  ?Substance and Sexual Activity  ? Alcohol use: Yes  ?  Alcohol/week: 1.0 standard drink  ?  Types: 1 Cans of beer per week  ?  Comment: one drink per dayd  ? Drug use: Never  ? Sexual activity: Yes  ?  Partners: Female  ? ? ? ? ? ? ?

## 2021-03-27 ENCOUNTER — Telehealth: Payer: Self-pay

## 2021-03-27 ENCOUNTER — Other Ambulatory Visit: Payer: Self-pay

## 2021-03-27 MED ORDER — ATORVASTATIN CALCIUM 40 MG PO TABS: 40.0000 mg | ORAL_TABLET | Freq: Every day | ORAL | 2 refills | Status: DC

## 2021-03-27 NOTE — Chronic Care Management (AMB) (Cosign Needed)
Chronic Care Management Pharmacy Assistant   Name: Asaiah Scarber  MRN: [id number] DOB: 11-Jul-1950  Reason for Encounter: Disease State/ Diabetes   Recent office visits:  03-20-2021 Lillard Anes, MD. Glucose UA= >=1000. Referral placed to orthopedic surgery. START Tylenol #3 300-30 mg 2 tablets 4 times daily as needed and Flomax 0.4 mg daily.  Recent consult visits:  03-26-2021 Leandrew Koyanagi, MD (Orthopedic surgery). Visit for chronic and severe bilateral knee pain.  03-24-2021 Persons, Bevely Palmer, [place] (Orthopedic surgery). Xray of both knees completed.  Hospital visits:  None in previous 6 months  Medications: Outpatient Encounter Medications as of 03/27/2021  Medication Sig   acetaminophen-codeine (TYLENOL #3) 300-30 MG tablet Take 2 tablets by mouth every 4 (four) hours as needed for moderate pain.   ALPRAZolam (XANAX) 0.5 MG tablet Take 1 tablet by mouth twice daily as needed   amLODipine-benazepril (LOTREL) 10-20 MG capsule Take 1 capsule by mouth once daily   atorvastatin (LIPITOR) 40 MG tablet Take 1 tablet by mouth once daily   Blood Glucose Monitoring Suppl (ONE TOUCH ULTRA 2) w/Device KIT USE TO CHECK GLUCOSE THREE TIMES DAILY   Diclofenac Sodium CR 100 MG 24 hr tablet Take 1 tablet by mouth once daily   FARXIGA 10 MG TABS tablet Take 1 tablet by mouth once daily   Insulin Pen Needle (BD PEN NEEDLE MICRO U/F) 32G X 6 MM MISC USE AS DIRECTED ONCE DAILY   metFORMIN (GLUCOPHAGE) 1000 MG tablet Take 1 tablet by mouth twice daily   sildenafil (VIAGRA) 100 MG tablet Take 1 tablet (100 mg total) by mouth daily as needed for erectile dysfunction.   tamsulosin (FLOMAX) 0.4 MG CAPS capsule Take 1 capsule (0.4 mg total) by mouth daily.   traMADol (ULTRAM) 50 MG tablet TAKE 1 TABLET BY MOUTH EVERY 8 HOURS AS NEEDED FOR MODERATE PAIN   TRESIBA FLEXTOUCH 100 UNIT/ML FlexTouch Pen INJECT 30 UNITS SUBCUTANEOUSLY ONCE DAILY   No facility-administered encounter medications on file  as of 03/27/2021.   Recent Relevant Labs: Lab Results  Component Value Date/Time   HGBA1C 6.3 (H) 12/02/2020 11:17 AM   HGBA1C 6.7 (H) 06/20/2020 09:37 AM   MICROALBUR 80 05/17/2019 09:03 AM    Kidney Function Lab Results  Component Value Date/Time   CREATININE 0.72 (L) 12/02/2020 11:17 AM   CREATININE 0.75 (L) 06/20/2020 09:37 AM   GFRNONAA 94 02/15/2020 10:01 AM   GFRAA 109 02/15/2020 10:01 AM     Current antihyperglycemic regimen:  Farxiga 10 mg daily Tresiba 30 units daily Metformin 1000 mg twice daily   Patient verbally confirms he is taking the above medications as directed. Yes  What recent interventions/DTPs have been made to improve glycemic control:  Educated on A1c and blood sugar goals; Extensive time was spent counseling patient on the pathophysiology and risk for complications with uncontrolled Diabetes.  Used teach back method for counseling on ADA diabetic diet.  Time spent on explaining role of carbohydrates versus sugars impacting blood glucose levels and meal planning. Current exercise consists of walking 10 minutes three times a week, plan is to increase to 20 minutes 5 days a week and then after 2-3 weeks increase to 30 minute 5 days a week.  -Counseled to check feet daily and get yearly eye exams  Have there been any recent hospitalizations or ED visits since last visit with CPP? No  Patient denies hypoglycemic symptoms  Patient denies hyperglycemic symptoms  How often are you checking your blood  sugar? once daily  What are your blood sugars ranging?  Fasting: 128, 113, 129, 100, 102, 112, 138, 128, 115 Before meals: None After meals: None Bedtime: None  On insulin? Yes How many units: 30  During the week, how often does your blood glucose drop below 70? Patient stated he had a reading of 64 one morning recently.  Are you checking your feet daily/regularly? Yes  NOTES: Patient stated he is getting a right knee replacement soon. Sent refill  request to pool.  Adherence Review: Is the patient currently on a STATIN medication? Yes Is the patient currently on ACE/ARB medication? No Does the patient have >5 day gap between last estimated fill dates? CPP to review  Care Gaps: Last eye exam / Retinopathy Screening? 07-01-2020 Last Annual Wellness Visit? 04-24-2020 Last Diabetic Foot Exam? Patient states Dr. Henrene Pastor examines feet.  Star Rating Drugs: Farxiga 10 mg- Patient assistance Metformin 1000 mg- Last filled 03-01-2021 90 DS Atorvastatin 40 mg- Last filled 11-28-2020 90 DS (Patient has a few pills left. Called [facility] and patient needs refills) Amlodipine-benazepril 10-20 mg- Last filled 01-17-2021 90 DS   [place] Clinical Pharmacist Assistant [phone]

## 2021-03-31 ENCOUNTER — Telehealth: Payer: Self-pay

## 2021-03-31 ENCOUNTER — Other Ambulatory Visit: Payer: Self-pay | Admitting: Legal Medicine

## 2021-03-31 DIAGNOSIS — M1712 Unilateral primary osteoarthritis, left knee: Secondary | ICD-10-CM

## 2021-03-31 DIAGNOSIS — M1711 Unilateral primary osteoarthritis, right knee: Secondary | ICD-10-CM

## 2021-03-31 MED ORDER — ACETAMINOPHEN-CODEINE #3 300-30 MG PO TABS: 2.0000 | ORAL_TABLET | ORAL | 3 refills | Status: AC | PRN

## 2021-03-31 NOTE — Telephone Encounter (Signed)
I re- sent the RX for 30 days, not 5 days, use tylenol #3, tramadol was not working, so not both at once ?lp ?

## 2021-03-31 NOTE — Telephone Encounter (Signed)
Pharmacy calling as insurance denied tylenol #3 for patient due to quantity. They are questioning if patient is supposed to be taking both tramadol and tylenol #3. Tramadol last picked up on 3/11.  ? ?Clayton James 03/31/21 3:01 PM ? ?

## 2021-04-02 NOTE — Progress Notes (Signed)
? ?Subjective:  ?Patient ID: Clayton James, male    DOB: 1950/06/08  Age: 71 y.o. MRN: [id number] ? ?Chief Complaint  ?Patient presents with  ? Diabetes  ? Hypertension  ? Hyperlipidemia  ? ? ?HPI: chronic visit ?Patient present with type 2 diabetes.  Specifically, this is type 2, insulin requiring diabetes.  Compliance with treatment has been good; patient take medicines as directed, maintains diet and exercise regimen, follows up as directed, and is keeping glucose diary.  Date of  diagnosis 5 years ago. Current medicines for diabetes Farxiga 10 mg daily, Tresiba 30 units daily, Metformin 1000 mg twice a day.  Patient performs foot exams daily and last ophthalmologic exam was 07/04/2020. Last A1C 6.3%. ? ?Patient presents for follow up of hypertension.  Patient tolerating  Amlodipine- Benazepril 10-20 mg daily well without side effects.  Patient was diagnosed with hypertension and has been treated for hypertension for 10 years.Patient is working on maintaining diet and exercise regimen and follows up as directed.  ? ?Patient presents with hyperlipidemia.  Compliance with treatment has been good; patient takes medicines as directed, maintains low cholesterol diet, follows up as directed, and maintains exercise regimen.  Patient is using Atorvastatin 40 mg daily without problems.  ? ? ?Current Outpatient Medications on File Prior to Visit  ?Medication Sig Dispense Refill  ? acetaminophen-codeine (TYLENOL #3) 300-30 MG tablet Take 2 tablets by mouth every 4 (four) hours as needed for moderate pain. 60 tablet 3  ? ALPRAZolam (XANAX) 0.5 MG tablet Take 1 tablet by mouth twice daily as needed 60 tablet 3  ? amLODipine-benazepril (LOTREL) 10-20 MG capsule Take 1 capsule by mouth once daily 90 capsule 2  ? atorvastatin (LIPITOR) 40 MG tablet Take 1 tablet (40 mg total) by mouth daily. 90 tablet 2  ? Blood Glucose Monitoring Suppl (ONE TOUCH ULTRA 2) w/Device KIT USE TO CHECK GLUCOSE THREE TIMES DAILY    ? Diclofenac Sodium  CR 100 MG 24 hr tablet Take 1 tablet by mouth once daily 90 tablet 2  ? FARXIGA 10 MG TABS tablet Take 1 tablet by mouth once daily 90 tablet 2  ? Insulin Pen Needle (BD PEN NEEDLE MICRO U/F) 32G X 6 MM MISC USE AS DIRECTED ONCE DAILY 100 each 4  ? metFORMIN (GLUCOPHAGE) 1000 MG tablet Take 1 tablet by mouth twice daily 180 tablet 2  ? sildenafil (VIAGRA) 100 MG tablet Take 1 tablet (100 mg total) by mouth daily as needed for erectile dysfunction. 10 tablet 3  ? tamsulosin (FLOMAX) 0.4 MG CAPS capsule Take 1 capsule (0.4 mg total) by mouth daily. 30 capsule 3  ? TRESIBA FLEXTOUCH 100 UNIT/ML FlexTouch Pen INJECT 30 UNITS SUBCUTANEOUSLY ONCE DAILY 15 mL 6  ? ?No current facility-administered medications on file prior to visit.  ? ?Past Medical History:  ?Diagnosis Date  ? Benign essential hypertension 04/27/2019  ? Chronic kidney disease, stage 3a ([place]) 04/27/2019  ? Mixed hyperlipidemia 04/27/2019  ? Type 2 diabetes mellitus with diabetic polyneuropathy ([place]) 04/27/2019  ? Type 2 diabetes mellitus with stage 3 chronic kidney disease ([place]) 04/27/2019  ? ?Past Surgical History:  ?Procedure Laterality Date  ? ELBOW SURGERY Left 1994  ? KNEE ARTHROSCOPY Right 1998  ? KNEE CARTILAGE SURGERY Left 1994  ?  ?Family History  ?Problem Relation Age of Onset  ? Diabetes Mother   ? Kidney disease Mother   ? Heart disease Father   ? ?Social History  ? ?Socioeconomic History  ? Marital status:  Married  ?  Spouse name: Not on file  ? Number of children: 5  ? Years of education: Not on file  ? Highest education level: Not on file  ?Occupational History  ? Not on file  ?Tobacco Use  ? Smoking status: Former  ?  Years: 8.00  ?  Types: Cigarettes  ?  Quit date: 2000  ?  Years since quitting: 23.2  ? Smokeless tobacco: Current  ?  Types: Chew  ? Tobacco comments:  ?  1 can per week  ?Vaping Use  ? Vaping Use: Never used  ?Substance and Sexual Activity  ? Alcohol use: Yes  ?  Alcohol/week: 1.0 standard drink  ?  Types: 1 Cans of beer per week  ?   Comment: one drink per dayd  ? Drug use: Never  ? Sexual activity: Yes  ?  Partners: Female  ?Other Topics Concern  ? Not on file  ?Social History Narrative  ? Not on file  ? ?Social Determinants of Health  ? ?Financial Resource Strain: Low Risk   ? Difficulty of Paying Living Expenses: Not hard at all  ?Food Insecurity: No Food Insecurity  ? Worried About Charity fundraiser in the Last Year: Never true  ? Ran Out of Food in the Last Year: Never true  ?Transportation Needs: No Transportation Needs  ? Lack of Transportation (Medical): No  ? Lack of Transportation (Non-Medical): No  ?Physical Activity: Not on file  ?Stress: Not on file  ?Social Connections: Not on file  ? ? ?Review of Systems  ?Constitutional:  Negative for chills, fatigue, fever and unexpected weight change.  ?HENT:  Negative for congestion, rhinorrhea, sinus pressure, sneezing and sore throat.   ?Eyes:  Negative for discharge and visual disturbance.  ?Respiratory:  Negative for cough, shortness of breath and wheezing.   ?Cardiovascular:  Negative for chest pain and palpitations.  ?Gastrointestinal:  Negative for abdominal pain, diarrhea, nausea and vomiting.  ?Endocrine: Negative for polydipsia, polyphagia and polyuria.  ?Genitourinary:  Negative for decreased urine volume, difficulty urinating, dysuria, frequency, penile swelling and urgency.  ?Musculoskeletal:  Negative for back pain, gait problem, joint swelling, neck pain and neck stiffness.  ?Neurological:  Negative for dizziness, seizures, weakness, numbness and headaches.  ?Psychiatric/Behavioral:  Negative for confusion, hallucinations, sleep disturbance and suicidal ideas. The patient is not nervous/anxious and is not hyperactive.   ? ? ?Objective:  ?BP (!) 156/80   Pulse 86   Temp 98 ?F (36.7 ?C)   Ht 5' 9.5" (1.765 m)   Wt 191 lb (86.6 kg)   SpO2 98%   BMI 27.80 kg/m?  ? ?BP/Weight 04/03/2021 03/26/2021 03/20/2021  ?Systolic BP 518 - 841  ?Diastolic BP 80 - 86  ?Wt. (Lbs) 191 188 187   ?BMI 27.8 27.36 26.83  ? ? ?Physical Exam ?Vitals reviewed.  ?Constitutional:   ?   General: He is not in acute distress. ?   Appearance: Normal appearance.  ?HENT:  ?   Head: Normocephalic.  ?   Right Ear: Tympanic membrane normal.  ?   Left Ear: Tympanic membrane normal.  ?   Nose: Nose normal.  ?   Mouth/Throat:  ?   Mouth: Mucous membranes are moist.  ?   Pharynx: Oropharynx is clear.  ?Eyes:  ?   Extraocular Movements: Extraocular movements intact.  ?   Conjunctiva/sclera: Conjunctivae normal.  ?   Pupils: Pupils are equal, round, and reactive to light.  ?Cardiovascular:  ?  Rate and Rhythm: Normal rate and regular rhythm.  ?   Pulses: Normal pulses.  ?   Heart sounds: Normal heart sounds. No murmur heard. ?  No gallop.  ?Pulmonary:  ?   Effort: Pulmonary effort is normal. No respiratory distress.  ?   Breath sounds: Normal breath sounds. No wheezing.  ?Abdominal:  ?   General: Abdomen is flat. Bowel sounds are normal. There is no distension.  ?   Tenderness: There is no abdominal tenderness.  ?Musculoskeletal:     ?   General: Tenderness present.  ?   Cervical back: Normal range of motion.  ?   Right lower leg: No edema.  ?   Left lower leg: No edema.  ?   Comments: OA both knees, needs tKA  ?Skin: ?   General: Skin is warm.  ?   Capillary Refill: Capillary refill takes less than 2 seconds.  ?Neurological:  ?   General: No focal deficit present.  ?   Mental Status: He is alert and oriented to person, place, and time. Mental status is at baseline.  ?   Gait: Gait normal.  ?   Deep Tendon Reflexes: Reflexes normal.  ?Psychiatric:     ?   Mood and Affect: Mood normal.     ?   Thought Content: Thought content normal.     ?   Judgment: Judgment normal.  ? ? ? ?  ? ?Lab Results  ?Component Value Date  ? WBC 8.2 12/02/2020  ? HGB 16.2 12/02/2020  ? HCT 46.6 12/02/2020  ? PLT 332 12/02/2020  ? GLUCOSE 122 (H) 12/02/2020  ? CHOL 193 12/02/2020  ? TRIG 113 12/02/2020  ? HDL 65 12/02/2020  ? LDLCALC 108 (H) 12/02/2020   ? ALT 15 12/02/2020  ? AST 15 12/02/2020  ? NA 142 12/02/2020  ? K 3.9 12/02/2020  ? CL 101 12/02/2020  ? CREATININE 0.72 (L) 12/02/2020  ? BUN 16 12/02/2020  ? CO2 21 12/02/2020  ? HGBA1C 6.3 (H) 11/14/202

## 2021-04-03 ENCOUNTER — Encounter: Payer: Self-pay | Admitting: Legal Medicine

## 2021-04-03 ENCOUNTER — Ambulatory Visit (INDEPENDENT_AMBULATORY_CARE_PROVIDER_SITE_OTHER): Payer: Medicare HMO | Admitting: Legal Medicine

## 2021-04-03 ENCOUNTER — Other Ambulatory Visit: Payer: Self-pay

## 2021-04-03 VITALS — BP 156/80 | HR 86 | Temp 98.0°F | Ht 69.5 in | Wt 191.0 lb

## 2021-04-03 DIAGNOSIS — E1142 Type 2 diabetes mellitus with diabetic polyneuropathy: Secondary | ICD-10-CM

## 2021-04-03 DIAGNOSIS — I10 Essential (primary) hypertension: Secondary | ICD-10-CM | POA: Diagnosis not present

## 2021-04-03 DIAGNOSIS — N1831 Chronic kidney disease, stage 3a: Secondary | ICD-10-CM

## 2021-04-03 DIAGNOSIS — I739 Peripheral vascular disease, unspecified: Secondary | ICD-10-CM

## 2021-04-03 DIAGNOSIS — M1711 Unilateral primary osteoarthritis, right knee: Secondary | ICD-10-CM | POA: Diagnosis not present

## 2021-04-03 DIAGNOSIS — E782 Mixed hyperlipidemia: Secondary | ICD-10-CM | POA: Diagnosis not present

## 2021-04-03 DIAGNOSIS — N401 Enlarged prostate with lower urinary tract symptoms: Secondary | ICD-10-CM | POA: Diagnosis not present

## 2021-04-03 DIAGNOSIS — R350 Frequency of micturition: Secondary | ICD-10-CM | POA: Diagnosis not present

## 2021-04-03 DIAGNOSIS — E1122 Type 2 diabetes mellitus with diabetic chronic kidney disease: Secondary | ICD-10-CM | POA: Diagnosis not present

## 2021-04-04 LAB — LIPID PANEL
Chol/HDL Ratio: 3.3 ratio (ref 0.0–5.0)
Cholesterol, Total: 198 mg/dL (ref 100–199)
HDL: 60 mg/dL (ref >39)
LDL Chol Calc (NIH): 117 mg/dL — ABNORMAL HIGH (ref 0–99)
Triglycerides: 117 mg/dL (ref 0–149)
VLDL Cholesterol Cal: 21 mg/dL (ref 5–40)

## 2021-04-04 LAB — COMPREHENSIVE METABOLIC PANEL
ALT: 23 IU/L (ref 0–44)
AST: 21 IU/L (ref 0–40)
Albumin/Globulin Ratio: 2.3 — ABNORMAL HIGH (ref 1.2–2.2)
Albumin: 4.3 g/dL (ref 3.8–4.8)
Alkaline Phosphatase: 79 IU/L (ref 44–121)
BUN/Creatinine Ratio: 21 (ref 10–24)
BUN: 19 mg/dL (ref 8–27)
Bilirubin Total: 0.5 mg/dL (ref 0.0–1.2)
CO2: 23 mmol/L (ref 20–29)
Calcium: 9.4 mg/dL (ref 8.6–10.2)
Chloride: 105 mmol/L (ref 96–106)
Creatinine, Ser: 0.89 mg/dL (ref 0.76–1.27)
Globulin, Total: 1.9 g/dL (ref 1.5–4.5)
Glucose: 90 mg/dL (ref 70–99)
Potassium: 4 mmol/L (ref 3.5–5.2)
Sodium: 142 mmol/L (ref 134–144)
Total Protein: 6.2 g/dL (ref 6.0–8.5)
eGFR: 92 mL/min/{1.73_m2} (ref >59)

## 2021-04-04 LAB — CBC WITH DIFFERENTIAL/PLATELET
Basophils Absolute: 0.1 10*3/uL (ref 0.0–0.2)
Basos: 1 %
EOS (ABSOLUTE): 0.3 10*3/uL (ref 0.0–0.4)
Eos: 4 %
Hematocrit: 46 % (ref 37.5–51.0)
Hemoglobin: 15.7 g/dL (ref 13.0–17.7)
Immature Grans (Abs): 0 10*3/uL (ref 0.0–0.1)
Immature Granulocytes: 1 %
Lymphocytes Absolute: 1.9 10*3/uL (ref 0.7–3.1)
Lymphs: 26 %
MCH: 31.8 pg (ref 26.6–33.0)
MCHC: 34.1 g/dL (ref 31.5–35.7)
MCV: 93 fL (ref 79–97)
Monocytes Absolute: 0.6 10*3/uL (ref 0.1–0.9)
Monocytes: 8 %
Neutrophils Absolute: 4.5 10*3/uL (ref 1.4–7.0)
Neutrophils: 60 %
Platelets: 274 10*3/uL (ref 150–450)
RBC: 4.94 x10E6/uL (ref 4.14–5.80)
RDW: 13.4 % (ref 11.6–15.4)
WBC: 7.3 10*3/uL (ref 3.4–10.8)

## 2021-04-04 LAB — MICROALBUMIN / CREATININE URINE RATIO
Creatinine, Urine: 77.1 mg/dL
Microalb/Creat Ratio: 27 mg/g creat (ref 0–29)
Microalbumin, Urine: 21.2 ug/mL

## 2021-04-04 LAB — HEMOGLOBIN A1C
Est. average glucose Bld gHb Est-mCnc: 163 mg/dL
Hgb A1c MFr Bld: 7.3 % — ABNORMAL HIGH (ref 4.8–5.6)

## 2021-04-04 LAB — CARDIOVASCULAR RISK ASSESSMENT

## 2021-04-04 LAB — PSA: Prostate Specific Ag, Serum: 1.7 ng/mL (ref 0.0–4.0)

## 2021-04-06 NOTE — Progress Notes (Signed)
Kidney and liver tests normal, A1c 7.3, LDL cholesterol high 117 high is he taking atorvastatin regularly, cbc normal, PSA 1.7, Microalbuminuria normal ?lp

## 2021-04-20 ENCOUNTER — Other Ambulatory Visit: Payer: Self-pay | Admitting: Legal Medicine

## 2021-04-20 DIAGNOSIS — F419 Anxiety disorder, unspecified: Secondary | ICD-10-CM

## 2021-05-06 ENCOUNTER — Other Ambulatory Visit: Payer: Self-pay

## 2021-05-19 ENCOUNTER — Other Ambulatory Visit: Payer: Self-pay | Admitting: Physician Assistant

## 2021-05-19 MED ORDER — DOCUSATE SODIUM 100 MG PO CAPS: 100.0000 mg | ORAL_CAPSULE | Freq: Every day | ORAL | 2 refills | Status: DC | PRN

## 2021-05-19 MED ORDER — ONDANSETRON HCL 4 MG PO TABS: 4.0000 mg | ORAL_TABLET | Freq: Three times a day (TID) | ORAL | 0 refills | Status: DC | PRN

## 2021-05-19 MED ORDER — CEPHALEXIN 500 MG PO CAPS: 500.0000 mg | ORAL_CAPSULE | Freq: Four times a day (QID) | ORAL | 0 refills | Status: DC

## 2021-05-19 MED ORDER — METHOCARBAMOL 750 MG PO TABS
750.0000 mg | ORAL_TABLET | Freq: Two times a day (BID) | ORAL | 2 refills | Status: DC | PRN
Start: 2021-05-19 — End: 2022-03-10

## 2021-05-19 MED ORDER — ASPIRIN EC 81 MG PO TBEC
81.0000 mg | DELAYED_RELEASE_TABLET | Freq: Two times a day (BID) | ORAL | 0 refills | Status: DC
Start: 2021-05-19 — End: 2022-04-07

## 2021-05-19 MED ORDER — OXYCODONE-ACETAMINOPHEN 5-325 MG PO TABS: 1.0000 | ORAL_TABLET | Freq: Four times a day (QID) | ORAL | 0 refills | Status: DC | PRN

## 2021-05-19 NOTE — Pre-Procedure Instructions (Addendum)
Surgical Instructions ? ? ? Your procedure is scheduled on Monday, May 8th. ? Report to [facility] Main Entrance "A" at 09:15 A.M., then check in with the Admitting office. ? Call this number if you have problems the morning of surgery: ? [phone] ? ? If you have any questions prior to your surgery date call [phone]: Open Monday-Friday 8am-4pm ? ? ? Remember: ? Do not eat after midnight the night before your surgery ? ?You may drink clear liquids until 08:15 AM the morning of your surgery.   ?Clear liquids allowed are: Water, Non-Citrus Juices (without pulp), Carbonated Beverages, Clear Tea, Black Coffee Only (NO MILK, CREAM OR POWDERED CREAMER of any kind), and Gatorade. ? ? ?Patient Instructions ? ?The night before surgery:  ?No food after midnight. ONLY clear liquids after midnight ? ? ?The day of surgery (if you have diabetes): ?Drink ONE (1) 12 oz G2 given to you in your pre admission testing appointment by 08:15 AM the morning of surgery. Drink in one sitting. Do not sip.  ?This drink was given to you during your hospital  ?pre-op appointment visit.  ?Nothing else to drink after completing the  ?12 oz bottle of G2. ? ?       If you have questions, please contact your surgeon?s office.  ? ?  ? Take these medicines the morning of surgery with A SIP OF WATER  ?atorvastatin (LIPITOR) ?tamsulosin [facility])  ? ?If needed: ?acetaminophen-codeine (TYLENOL #3) ?ALPRAZolam Duanne Moron)  ?fluticasone (FLONASE)  ? ?As of today, STOP taking any Aspirin (unless otherwise instructed by your surgeon) Aleve, Naproxen, Ibuprofen, Motrin, Advil, Goody's, BC's, all herbal medications, fish oil, and all vitamins. This includes your diclofenac Sodium (VOLTAREN) cream and pill. ? ? ?WHAT DO I DO ABOUT MY DIABETES MEDICATION? ? ? ?Do not take FARXIGA the day before surgery 5/7 or the morning of surgery 5/8. ?Do not take metFORMIN (GLUCOPHAGE) the morning of surgery ?THE NIGHT BEFORE SURGERY, take 15 units (50%) of TRESIBA  FLEXTOUCH.     ? ? ?THE MORNING OF SURGERY, take 15 units (50%) of TRESIBA FLEXTOUCH. ? ? ? ? ?HOW TO MANAGE YOUR DIABETES ?BEFORE AND AFTER SURGERY ? ?Why is it important to control my blood sugar before and after surgery? ?Improving blood sugar levels before and after surgery helps healing and can limit problems. ?A way of improving blood sugar control is eating a healthy diet by: ? Eating less sugar and carbohydrates ? Increasing activity/exercise ? Talking with your doctor about reaching your blood sugar goals ?High blood sugars (greater than 180 mg/dL) can raise your risk of infections and slow your recovery, so you will need to focus on controlling your diabetes during the weeks before surgery. ?Make sure that the doctor who takes care of your diabetes knows about your planned surgery including the date and location. ? ?How do I manage my blood sugar before surgery? ?Check your blood sugar at least 4 times a day, starting 2 days before surgery, to make sure that the level is not too high or low. ? ?Check your blood sugar the morning of your surgery when you wake up and every 2 hours until you get to the Short Stay unit. ? ?If your blood sugar is less than 70 mg/dL, you will need to treat for low blood sugar: ?Do not take insulin. ?Treat a low blood sugar (less than 70 mg/dL) with ? cup of clear juice (cranberry or apple), 4 glucose tablets, OR glucose gel. ?Recheck blood  sugar in 15 minutes after treatment (to make sure it is greater than 70 mg/dL). If your blood sugar is not greater than 70 mg/dL on recheck, call [phone] for further instructions. ?Report your blood sugar to the short stay nurse when you get to Short Stay. ? ?If you are admitted to the hospital after surgery: ?Your blood sugar will be checked by the staff and you will probably be given insulin after surgery (instead of oral diabetes medicines) to make sure you have good blood sugar levels. ?The goal for blood sugar control after surgery  is 80-180 mg/dL.          ?           ?Do NOT Smoke (Tobacco/Vaping) for 24 hours prior to your procedure. ? ?If you use a CPAP at night, you may bring your mask/headgear for your overnight stay. ?  ?Contacts, glasses, piercing's, hearing aid's, dentures or partials may not be worn into surgery, please bring cases for these belongings.  ?  ?For patients admitted to the hospital, discharge time will be determined by your treatment team. ?  ?Patients discharged the day of surgery will not be allowed to drive home, and someone needs to stay with them for 24 hours. ? ?SURGICAL WAITING ROOM VISITATION ?Patients having surgery or a procedure may have two support people in the waiting room. These visitors may be switched out with other visitors if needed. ?Children under the age of 20 must have an adult accompany them who is not the patient. ?If the patient needs to stay at the hospital during part of their recovery, the visitor guidelines for inpatient rooms apply. ? ?Please refer to the [facility] website for the visitor guidelines for Inpatients (after your surgery is over and you are in a regular room).  ? ? ?Special instructions:   ?- Preparing For Surgery ? ?Before surgery, you can play an important role. Because skin is not sterile, your skin needs to be as free of germs as possible. You can reduce the number of germs on your skin by washing with CHG (chlorahexidine gluconate) Soap before surgery.  CHG is an antiseptic cleaner which kills germs and bonds with the skin to continue killing germs even after washing.   ? ?Oral Hygiene is also important to reduce your risk of infection.  Remember - BRUSH YOUR TEETH THE MORNING OF SURGERY WITH YOUR REGULAR TOOTHPASTE ? ?Please do not use if you have an allergy to CHG or antibacterial soaps. If your skin becomes reddened/irritated stop using the CHG.  ?Do not shave (including legs and underarms) for at least 48 hours prior to first CHG shower. It is OK to shave  your face. ? ?Please follow these instructions carefully. ?  ?Shower the NIGHT BEFORE SURGERY and the MORNING OF SURGERY ? ?If you chose to wash your hair, wash your hair first as usual with your normal shampoo. ? ?After you shampoo, rinse your hair and body thoroughly to remove the shampoo. ? ?Use CHG Soap as you would any other liquid soap. You can apply CHG directly to the skin and wash gently with a scrungie or a clean washcloth.  ? ?Apply the CHG Soap to your body ONLY FROM THE NECK DOWN.  Do not use on open wounds or open sores. Avoid contact with your eyes, ears, mouth and genitals (private parts). Wash Face and genitals (private parts)  with your normal soap.  ? ?Wash thoroughly, paying special attention to the area where your  surgery will be performed. ? ?Thoroughly rinse your body with warm water from the neck down. ? ?DO NOT shower/wash with your normal soap after using and rinsing off the CHG Soap. ? ?Pat yourself dry with a CLEAN TOWEL. ? ?Wear CLEAN PAJAMAS to bed the night before surgery ? ?Place CLEAN SHEETS on your bed the night before your surgery ? ?DO NOT SLEEP WITH PETS. ? ? ?Day of Surgery: ?Take a shower with CHG soap. ?Do not wear jewelry  ?Do not wear lotions, powders, colognes, or deodorant. ?Do not shave 48 hours prior to surgery.  Men may shave face and neck. ?Do not bring valuables to the hospital.  ?[facility] is not responsible for any belongings or valuables. ? ?Wear Clean/Comfortable clothing the morning of surgery ?Remember to brush your teeth WITH YOUR REGULAR TOOTHPASTE. ?  ?Please read over the following fact sheets that you were given. ? ? ? ?If you received a COVID test during your pre-op visit  it is requested that you wear a mask when out in public, stay away from anyone that may not be feeling well and notify your surgeon if you develop symptoms. If you have been in contact with anyone that has tested positive in the last 10 days please notify you surgeon.  ?

## 2021-05-20 ENCOUNTER — Other Ambulatory Visit: Payer: Self-pay

## 2021-05-20 ENCOUNTER — Encounter (HOSPITAL_COMMUNITY)
Admission: RE | Admit: 2021-05-20 | Discharge: 2021-05-20 | Disposition: A | Discharge status: Home / Self Care | Payer: Medicare HMO | Source: Ambulatory Visit | Attending: Orthopaedic Surgery | Admitting: Orthopaedic Surgery

## 2021-05-20 ENCOUNTER — Encounter (HOSPITAL_COMMUNITY): Payer: Self-pay

## 2021-05-20 VITALS — BP 167/93 | HR 101 | Temp 97.5°F | Resp 17 | Ht 71.0 in | Wt 192.9 lb

## 2021-05-20 DIAGNOSIS — M1711 Unilateral primary osteoarthritis, right knee: Secondary | ICD-10-CM | POA: Insufficient documentation

## 2021-05-20 DIAGNOSIS — Z01818 Encounter for other preprocedural examination: Secondary | ICD-10-CM | POA: Diagnosis not present

## 2021-05-20 DIAGNOSIS — E119 Type 2 diabetes mellitus without complications: Secondary | ICD-10-CM | POA: Insufficient documentation

## 2021-05-20 HISTORY — DX: Unspecified osteoarthritis, unspecified site: M19.90

## 2021-05-20 LAB — COMPREHENSIVE METABOLIC PANEL
ALT: 24 U/L (ref 0–44)
AST: 19 U/L (ref 15–41)
Albumin: 4.2 g/dL (ref 3.5–5.0)
Alkaline Phosphatase: 75 U/L (ref 38–126)
Anion gap: 9 (ref 5–15)
BUN: 19 mg/dL (ref 8–23)
CO2: 26 mmol/L (ref 22–32)
Calcium: 9.7 mg/dL (ref 8.9–10.3)
Chloride: 104 mmol/L (ref 98–111)
Creatinine, Ser: 0.85 mg/dL (ref 0.61–1.24)
GFR, Estimated: >60 mL/min (ref >60)
Glucose, Bld: 195 mg/dL — ABNORMAL HIGH (ref 70–99)
Potassium: 4 mmol/L (ref 3.5–5.1)
Sodium: 139 mmol/L (ref 135–145)
Total Bilirubin: 0.7 mg/dL (ref 0.3–1.2)
Total Protein: 7.1 g/dL (ref 6.5–8.1)

## 2021-05-20 LAB — CBC
HCT: 48.4 % (ref 39.0–52.0)
Hemoglobin: 16.6 g/dL (ref 13.0–17.0)
MCH: 32.3 pg (ref 26.0–34.0)
MCHC: 34.3 g/dL (ref 30.0–36.0)
MCV: 94.2 fL (ref 80.0–100.0)
Platelets: 307 10*3/uL (ref 150–400)
RBC: 5.14 MIL/uL (ref 4.22–5.81)
RDW: 13.2 % (ref 11.5–15.5)
WBC: 9.7 10*3/uL (ref 4.0–10.5)
nRBC: 0 % (ref 0.0–0.2)

## 2021-05-20 LAB — SURGICAL PCR SCREEN
MRSA, PCR: NEGATIVE
Staphylococcus aureus: POSITIVE — AB

## 2021-05-20 LAB — GLUCOSE, CAPILLARY: Glucose-Capillary: 232 mg/dL — ABNORMAL HIGH (ref 70–99)

## 2021-05-20 NOTE — Progress Notes (Signed)
PCP - Dr. Reinaldo Meeker ?Cardiologist - denies ? ?PPM/ICD - denies ? ? ?Chest x-ray - denies ?EKG - 05/20/21 at PAT ?Stress Test - 1990's per pt, no abnormalities ?ECHO - denies ?Cardiac Cath - denies ? ?Sleep Study - denies ? ?DM- Type 2 ?Fasting Blood Sugar - pt unsure ?Checks Blood Sugar "maybe once" a day ? ?ASA/Blood Thinner Instructions: n/a ? ? ?ERAS Protcol - yes ?PRE-SURGERY water given at PAT ? ?COVID TEST- n/a ? ? ?Anesthesia review: no ? ?Patient denies shortness of breath, fever, cough and chest pain at PAT appointment ? ? ?All instructions explained to the patient, with a verbal understanding of the material. Patient agrees to go over the instructions while at home for a better understanding. Patient also instructed to notify surgeon of any contact with COVID+ person or if he develops any symptoms. The opportunity to ask questions was provided. ?  ?

## 2021-05-23 MED ORDER — TRANEXAMIC ACID 1000 MG/10ML IV SOLN
2000.0000 mg | INTRAVENOUS | Status: DC
  Filled 2021-05-23: qty 20

## 2021-05-23 NOTE — Progress Notes (Signed)
Pt aware of surgery time change, arrival at 0830 ?

## 2021-05-26 ENCOUNTER — Other Ambulatory Visit: Payer: Self-pay

## 2021-05-26 ENCOUNTER — Encounter (HOSPITAL_COMMUNITY)
Admission: RE | Disposition: A | Discharge status: Home / Self Care | Payer: Self-pay | Source: Ambulatory Visit | Attending: Orthopaedic Surgery

## 2021-05-26 ENCOUNTER — Observation Stay (HOSPITAL_COMMUNITY)
Admission: RE | Admit: 2021-05-26 | Discharge: 2021-05-27 | Disposition: A | Discharge status: Home / Self Care | Payer: Medicare HMO | Source: Ambulatory Visit | Attending: Orthopaedic Surgery | Admitting: Orthopaedic Surgery

## 2021-05-26 ENCOUNTER — Ambulatory Visit (HOSPITAL_COMMUNITY): Payer: Medicare HMO | Admitting: Certified Registered Nurse Anesthetist

## 2021-05-26 ENCOUNTER — Observation Stay (HOSPITAL_COMMUNITY): Payer: Medicare HMO

## 2021-05-26 ENCOUNTER — Ambulatory Visit (HOSPITAL_BASED_OUTPATIENT_CLINIC_OR_DEPARTMENT_OTHER): Payer: Medicare HMO | Admitting: Certified Registered Nurse Anesthetist

## 2021-05-26 ENCOUNTER — Encounter (HOSPITAL_COMMUNITY): Payer: Self-pay | Admitting: Orthopaedic Surgery

## 2021-05-26 DIAGNOSIS — Z87891 Personal history of nicotine dependence: Secondary | ICD-10-CM | POA: Insufficient documentation

## 2021-05-26 DIAGNOSIS — M1711 Unilateral primary osteoarthritis, right knee: Secondary | ICD-10-CM | POA: Diagnosis not present

## 2021-05-26 DIAGNOSIS — N1831 Chronic kidney disease, stage 3a: Secondary | ICD-10-CM | POA: Insufficient documentation

## 2021-05-26 DIAGNOSIS — Z7982 Long term (current) use of aspirin: Secondary | ICD-10-CM | POA: Insufficient documentation

## 2021-05-26 DIAGNOSIS — Z7984 Long term (current) use of oral hypoglycemic drugs: Secondary | ICD-10-CM | POA: Insufficient documentation

## 2021-05-26 DIAGNOSIS — Z96651 Presence of right artificial knee joint: Secondary | ICD-10-CM | POA: Diagnosis not present

## 2021-05-26 DIAGNOSIS — I129 Hypertensive chronic kidney disease with stage 1 through stage 4 chronic kidney disease, or unspecified chronic kidney disease: Secondary | ICD-10-CM | POA: Insufficient documentation

## 2021-05-26 DIAGNOSIS — E1122 Type 2 diabetes mellitus with diabetic chronic kidney disease: Secondary | ICD-10-CM | POA: Insufficient documentation

## 2021-05-26 DIAGNOSIS — Z79899 Other long term (current) drug therapy: Secondary | ICD-10-CM | POA: Insufficient documentation

## 2021-05-26 DIAGNOSIS — E1142 Type 2 diabetes mellitus with diabetic polyneuropathy: Secondary | ICD-10-CM | POA: Diagnosis not present

## 2021-05-26 DIAGNOSIS — G8918 Other acute postprocedural pain: Secondary | ICD-10-CM | POA: Diagnosis not present

## 2021-05-26 DIAGNOSIS — Z471 Aftercare following joint replacement surgery: Secondary | ICD-10-CM | POA: Diagnosis not present

## 2021-05-26 HISTORY — PX: TOTAL KNEE ARTHROPLASTY: SHX125

## 2021-05-26 LAB — GLUCOSE, CAPILLARY
Glucose-Capillary: 150 mg/dL — ABNORMAL HIGH (ref 70–99)
Glucose-Capillary: 138 mg/dL — ABNORMAL HIGH (ref 70–99)
Glucose-Capillary: 143 mg/dL — ABNORMAL HIGH (ref 70–99)
Glucose-Capillary: 250 mg/dL — ABNORMAL HIGH (ref 70–99)
Glucose-Capillary: 293 mg/dL — ABNORMAL HIGH (ref 70–99)

## 2021-05-26 SURGERY — ARTHROPLASTY, KNEE, TOTAL
Anesthesia: Spinal | Site: Knee | Laterality: Right

## 2021-05-26 MED ORDER — ONDANSETRON HCL 4 MG/2ML IJ SOLN
INTRAMUSCULAR | Status: DC | PRN
  Administered 2021-05-26: 4 mg via INTRAVENOUS

## 2021-05-26 MED ORDER — DAPAGLIFLOZIN PROPANEDIOL 10 MG PO TABS
10.0000 mg | ORAL_TABLET | Freq: Every day | ORAL | Status: DC
  Administered 2021-05-27: 10 mg via ORAL
  Filled 2021-05-26: qty 1

## 2021-05-26 MED ORDER — INSULIN GLARGINE-YFGN 100 UNIT/ML ~~LOC~~ SOLN
30.0000 [IU] | Freq: Every day | SUBCUTANEOUS | Status: DC
  Administered 2021-05-26: 30 [IU] via SUBCUTANEOUS
  Filled 2021-05-26 (×2): qty 0.3

## 2021-05-26 MED ORDER — TRANEXAMIC ACID-NACL 1000-0.7 MG/100ML-% IV SOLN
INTRAVENOUS | Status: AC
  Filled 2021-05-26: qty 100

## 2021-05-26 MED ORDER — PHENOL 1.4 % MT LIQD: 1.0000 | OROMUCOSAL | Status: DC | PRN

## 2021-05-26 MED ORDER — ACETAMINOPHEN 325 MG PO TABS: 325.0000 mg | ORAL_TABLET | Freq: Four times a day (QID) | ORAL | Status: DC | PRN

## 2021-05-26 MED ORDER — ORAL CARE MOUTH RINSE: 15.0000 mL | Freq: Once | OROMUCOSAL | Status: AC

## 2021-05-26 MED ORDER — ALPRAZOLAM 0.5 MG PO TABS
0.5000 mg | ORAL_TABLET | Freq: Two times a day (BID) | ORAL | Status: DC | PRN
  Administered 2021-05-26: 0.5 mg via ORAL
  Filled 2021-05-26: qty 1

## 2021-05-26 MED ORDER — DEXMEDETOMIDINE (PRECEDEX) IN NS 20 MCG/5ML (4 MCG/ML) IV SYRINGE
PREFILLED_SYRINGE | INTRAVENOUS | Status: DC | PRN
  Administered 2021-05-26: 8 ug via INTRAVENOUS
  Administered 2021-05-26: 4 ug via INTRAVENOUS
  Administered 2021-05-26: 8 ug via INTRAVENOUS

## 2021-05-26 MED ORDER — ACETAMINOPHEN 500 MG PO TABS
1000.0000 mg | ORAL_TABLET | Freq: Four times a day (QID) | ORAL | Status: AC
  Administered 2021-05-26 – 2021-05-27 (×3): 1000 mg via ORAL
  Filled 2021-05-26 (×4): qty 2

## 2021-05-26 MED ORDER — EPHEDRINE SULFATE-NACL 50-0.9 MG/10ML-% IV SOSY
PREFILLED_SYRINGE | INTRAVENOUS | Status: DC | PRN
  Administered 2021-05-26 (×3): 5 mg via INTRAVENOUS

## 2021-05-26 MED ORDER — PROPOFOL 1000 MG/100ML IV EMUL
INTRAVENOUS | Status: AC
  Filled 2021-05-26: qty 100

## 2021-05-26 MED ORDER — TRANEXAMIC ACID 1000 MG/10ML IV SOLN
INTRAVENOUS | Status: DC | PRN
  Administered 2021-05-26: 2000 mg via TOPICAL

## 2021-05-26 MED ORDER — FENTANYL CITRATE (PF) 100 MCG/2ML IJ SOLN
INTRAMUSCULAR | Status: AC
  Administered 2021-05-26: 50 ug via INTRAVENOUS
  Filled 2021-05-26: qty 2

## 2021-05-26 MED ORDER — AMISULPRIDE (ANTIEMETIC) 5 MG/2ML IV SOLN: 10.0000 mg | Freq: Once | INTRAVENOUS | Status: DC | PRN

## 2021-05-26 MED ORDER — PROPOFOL 10 MG/ML IV BOLUS
INTRAVENOUS | Status: DC | PRN
  Administered 2021-05-26 (×6): 20 mg via INTRAVENOUS

## 2021-05-26 MED ORDER — METOCLOPRAMIDE HCL 5 MG PO TABS
5.0000 mg | ORAL_TABLET | Freq: Three times a day (TID) | ORAL | Status: DC | PRN
  Filled 2021-05-26: qty 2

## 2021-05-26 MED ORDER — PHENYLEPHRINE HCL-NACL 20-0.9 MG/250ML-% IV SOLN
INTRAVENOUS | Status: DC | PRN
  Administered 2021-05-26: 25 ug/min via INTRAVENOUS

## 2021-05-26 MED ORDER — EPHEDRINE 5 MG/ML INJ
INTRAVENOUS | Status: AC
  Filled 2021-05-26: qty 5

## 2021-05-26 MED ORDER — PROPOFOL 500 MG/50ML IV EMUL
INTRAVENOUS | Status: DC | PRN
  Administered 2021-05-26: 75 ug/kg/min via INTRAVENOUS
  Administered 2021-05-26: 125 ug/kg/min via INTRAVENOUS

## 2021-05-26 MED ORDER — FENTANYL CITRATE (PF) 100 MCG/2ML IJ SOLN: 50.0000 ug | Freq: Once | INTRAMUSCULAR | Status: AC

## 2021-05-26 MED ORDER — DEXMEDETOMIDINE (PRECEDEX) IN NS 20 MCG/5ML (4 MCG/ML) IV SYRINGE
PREFILLED_SYRINGE | INTRAVENOUS | Status: AC
  Filled 2021-05-26: qty 10

## 2021-05-26 MED ORDER — CEFAZOLIN SODIUM-DEXTROSE 2-4 GM/100ML-% IV SOLN
INTRAVENOUS | Status: AC
  Filled 2021-05-26: qty 100

## 2021-05-26 MED ORDER — DOCUSATE SODIUM 100 MG PO CAPS
100.0000 mg | ORAL_CAPSULE | Freq: Two times a day (BID) | ORAL | Status: DC
  Administered 2021-05-26 – 2021-05-27 (×3): 100 mg via ORAL
  Filled 2021-05-26 (×3): qty 1

## 2021-05-26 MED ORDER — DEXAMETHASONE SODIUM PHOSPHATE 10 MG/ML IJ SOLN
INTRAMUSCULAR | Status: AC
  Filled 2021-05-26: qty 1

## 2021-05-26 MED ORDER — FERROUS SULFATE 325 (65 FE) MG PO TABS
325.0000 mg | ORAL_TABLET | Freq: Three times a day (TID) | ORAL | Status: DC
  Administered 2021-05-26 – 2021-05-27 (×2): 325 mg via ORAL
  Filled 2021-05-26 (×2): qty 1

## 2021-05-26 MED ORDER — OXYCODONE HCL ER 10 MG PO T12A
10.0000 mg | EXTENDED_RELEASE_TABLET | Freq: Two times a day (BID) | ORAL | Status: DC
  Administered 2021-05-26 – 2021-05-27 (×3): 10 mg via ORAL
  Filled 2021-05-26 (×3): qty 1

## 2021-05-26 MED ORDER — KETOROLAC TROMETHAMINE 15 MG/ML IJ SOLN
7.5000 mg | Freq: Four times a day (QID) | INTRAMUSCULAR | Status: AC
  Administered 2021-05-26 – 2021-05-27 (×3): 7.5 mg via INTRAVENOUS
  Filled 2021-05-26 (×3): qty 1

## 2021-05-26 MED ORDER — ONDANSETRON HCL 4 MG/2ML IJ SOLN: 4.0000 mg | Freq: Four times a day (QID) | INTRAMUSCULAR | Status: DC | PRN

## 2021-05-26 MED ORDER — PRONTOSAN WOUND IRRIGATION OPTIME
TOPICAL | Status: DC | PRN
  Administered 2021-05-26: 1

## 2021-05-26 MED ORDER — CHLORHEXIDINE GLUCONATE 0.12 % MT SOLN
OROMUCOSAL | Status: AC
Start: 2021-05-26 — End: 2021-05-26
  Administered 2021-05-26: 15 mL via OROMUCOSAL
  Filled 2021-05-26: qty 15

## 2021-05-26 MED ORDER — MIDAZOLAM HCL 2 MG/2ML IJ SOLN
1.0000 mg | Freq: Once | INTRAMUSCULAR | Status: AC
Start: 2021-05-26 — End: 2021-05-26

## 2021-05-26 MED ORDER — PROPOFOL 10 MG/ML IV BOLUS
INTRAVENOUS | Status: AC
  Filled 2021-05-26: qty 20

## 2021-05-26 MED ORDER — AMLODIPINE BESYLATE 5 MG PO TABS
10.0000 mg | ORAL_TABLET | Freq: Every day | ORAL | Status: DC
  Administered 2021-05-26 – 2021-05-27 (×2): 10 mg via ORAL
  Filled 2021-05-26 (×2): qty 2

## 2021-05-26 MED ORDER — BUPIVACAINE-MELOXICAM ER 400-12 MG/14ML IJ SOLN
INTRAMUSCULAR | Status: DC | PRN
  Administered 2021-05-26: 400 mg

## 2021-05-26 MED ORDER — ONDANSETRON HCL 4 MG PO TABS
4.0000 mg | ORAL_TABLET | Freq: Four times a day (QID) | ORAL | Status: DC | PRN
  Filled 2021-05-26: qty 1

## 2021-05-26 MED ORDER — AMLODIPINE BESY-BENAZEPRIL HCL 10-20 MG PO CAPS: 1.0000 | ORAL_CAPSULE | Freq: Every day | ORAL | Status: DC

## 2021-05-26 MED ORDER — LACTATED RINGERS IV SOLN: INTRAVENOUS | Status: DC

## 2021-05-26 MED ORDER — TAMSULOSIN HCL 0.4 MG PO CAPS
0.4000 mg | ORAL_CAPSULE | Freq: Every day | ORAL | Status: DC
Start: 2021-05-26 — End: 2021-05-27
  Administered 2021-05-26 – 2021-05-27 (×2): 0.4 mg via ORAL
  Filled 2021-05-26 (×2): qty 1

## 2021-05-26 MED ORDER — INSULIN DEGLUDEC 100 UNIT/ML ~~LOC~~ SOPN: 30.0000 [IU] | PEN_INJECTOR | Freq: Every day | SUBCUTANEOUS | Status: DC

## 2021-05-26 MED ORDER — OXYCODONE HCL 5 MG PO TABS: 10.0000 mg | ORAL_TABLET | ORAL | Status: DC | PRN

## 2021-05-26 MED ORDER — METHOCARBAMOL 1000 MG/10ML IJ SOLN
500.0000 mg | Freq: Four times a day (QID) | INTRAVENOUS | Status: DC | PRN
  Filled 2021-05-26: qty 5

## 2021-05-26 MED ORDER — VANCOMYCIN HCL 1000 MG IV SOLR
INTRAVENOUS | Status: DC | PRN
Start: 2021-05-26 — End: 2021-05-26
  Administered 2021-05-26 (×2): 1000 mg via TOPICAL

## 2021-05-26 MED ORDER — HYDROMORPHONE HCL 1 MG/ML IJ SOLN: 0.2500 mg | INTRAMUSCULAR | Status: DC | PRN

## 2021-05-26 MED ORDER — DEXAMETHASONE SODIUM PHOSPHATE 10 MG/ML IJ SOLN
INTRAMUSCULAR | Status: DC | PRN
  Administered 2021-05-26: 5 mg via INTRAVENOUS

## 2021-05-26 MED ORDER — BUPIVACAINE IN DEXTROSE 0.75-8.25 % IT SOLN
INTRATHECAL | Status: DC | PRN
  Administered 2021-05-26: 1.6 mL via INTRATHECAL

## 2021-05-26 MED ORDER — CEFAZOLIN SODIUM-DEXTROSE 2-4 GM/100ML-% IV SOLN
2.0000 g | Freq: Four times a day (QID) | INTRAVENOUS | Status: AC
  Administered 2021-05-26 (×2): 2 g via INTRAVENOUS
  Filled 2021-05-26 (×2): qty 100

## 2021-05-26 MED ORDER — VANCOMYCIN HCL 1000 MG IV SOLR
INTRAVENOUS | Status: AC
  Filled 2021-05-26: qty 20

## 2021-05-26 MED ORDER — MENTHOL 3 MG MT LOZG: 1.0000 | LOZENGE | OROMUCOSAL | Status: DC | PRN

## 2021-05-26 MED ORDER — METHOCARBAMOL 500 MG PO TABS
500.0000 mg | ORAL_TABLET | Freq: Four times a day (QID) | ORAL | Status: DC | PRN
  Administered 2021-05-26 – 2021-05-27 (×3): 500 mg via ORAL
  Filled 2021-05-26 (×3): qty 1

## 2021-05-26 MED ORDER — TRANEXAMIC ACID-NACL 1000-0.7 MG/100ML-% IV SOLN
1000.0000 mg | INTRAVENOUS | Status: AC
  Administered 2021-05-26: 1000 mg via INTRAVENOUS

## 2021-05-26 MED ORDER — METFORMIN HCL 500 MG PO TABS
1000.0000 mg | ORAL_TABLET | Freq: Two times a day (BID) | ORAL | Status: DC
  Administered 2021-05-26 – 2021-05-27 (×2): 1000 mg via ORAL
  Filled 2021-05-26 (×2): qty 2

## 2021-05-26 MED ORDER — ACETAMINOPHEN 325 MG PO TABS: 325.0000 mg | ORAL_TABLET | Freq: Once | ORAL | Status: DC | PRN

## 2021-05-26 MED ORDER — INSULIN ASPART 100 UNIT/ML IJ SOLN
0.0000 [IU] | Freq: Three times a day (TID) | INTRAMUSCULAR | Status: DC
  Administered 2021-05-26: 5 [IU] via SUBCUTANEOUS
  Administered 2021-05-27: 11 [IU] via SUBCUTANEOUS

## 2021-05-26 MED ORDER — MEPERIDINE HCL 25 MG/ML IJ SOLN: 6.2500 mg | INTRAMUSCULAR | Status: DC | PRN

## 2021-05-26 MED ORDER — ACETAMINOPHEN 160 MG/5ML PO SOLN: 325.0000 mg | Freq: Once | ORAL | Status: DC | PRN

## 2021-05-26 MED ORDER — MIDAZOLAM HCL 2 MG/2ML IJ SOLN
INTRAMUSCULAR | Status: AC
  Administered 2021-05-26: 1 mg via INTRAVENOUS
  Filled 2021-05-26: qty 2

## 2021-05-26 MED ORDER — ASPIRIN 81 MG PO CHEW
81.0000 mg | CHEWABLE_TABLET | Freq: Two times a day (BID) | ORAL | Status: DC
  Administered 2021-05-26 – 2021-05-27 (×2): 81 mg via ORAL
  Filled 2021-05-26 (×2): qty 1

## 2021-05-26 MED ORDER — DEXAMETHASONE SODIUM PHOSPHATE 10 MG/ML IJ SOLN
10.0000 mg | Freq: Once | INTRAMUSCULAR | Status: AC
  Administered 2021-05-27: 10 mg via INTRAVENOUS
  Filled 2021-05-26: qty 1

## 2021-05-26 MED ORDER — TRANEXAMIC ACID-NACL 1000-0.7 MG/100ML-% IV SOLN
1000.0000 mg | Freq: Once | INTRAVENOUS | Status: AC
  Administered 2021-05-26: 1000 mg via INTRAVENOUS
  Filled 2021-05-26 (×2): qty 100

## 2021-05-26 MED ORDER — POVIDONE-IODINE 10 % EX SWAB
2.0000 "application " | Freq: Once | CUTANEOUS | Status: AC
  Administered 2021-05-26: 2 via TOPICAL

## 2021-05-26 MED ORDER — HYDROMORPHONE HCL 1 MG/ML IJ SOLN: 0.5000 mg | INTRAMUSCULAR | Status: DC | PRN

## 2021-05-26 MED ORDER — OXYCODONE HCL 5 MG PO TABS
5.0000 mg | ORAL_TABLET | ORAL | Status: DC | PRN
  Administered 2021-05-26 – 2021-05-27 (×4): 10 mg via ORAL
  Filled 2021-05-26 (×4): qty 2

## 2021-05-26 MED ORDER — BENAZEPRIL HCL 20 MG PO TABS
20.0000 mg | ORAL_TABLET | Freq: Every day | ORAL | Status: DC
  Administered 2021-05-26 – 2021-05-27 (×2): 20 mg via ORAL
  Filled 2021-05-26 (×2): qty 1

## 2021-05-26 MED ORDER — SODIUM CHLORIDE 0.9 % IV SOLN: INTRAVENOUS | Status: DC

## 2021-05-26 MED ORDER — CHLORHEXIDINE GLUCONATE 0.12 % MT SOLN: 15.0000 mL | Freq: Once | OROMUCOSAL | Status: AC

## 2021-05-26 MED ORDER — SODIUM CHLORIDE 0.9 % IR SOLN
Status: DC | PRN
  Administered 2021-05-26: 1000 mL

## 2021-05-26 MED ORDER — CEFAZOLIN SODIUM-DEXTROSE 2-4 GM/100ML-% IV SOLN
2.0000 g | INTRAVENOUS | Status: AC
  Administered 2021-05-26: 2 g via INTRAVENOUS

## 2021-05-26 MED ORDER — INSULIN ASPART 100 UNIT/ML IJ SOLN
0.0000 [IU] | Freq: Every day | INTRAMUSCULAR | Status: DC
  Administered 2021-05-26: 3 [IU] via SUBCUTANEOUS

## 2021-05-26 MED ORDER — ONDANSETRON HCL 4 MG/2ML IJ SOLN
INTRAMUSCULAR | Status: AC
  Filled 2021-05-26: qty 2

## 2021-05-26 MED ORDER — ACETAMINOPHEN 10 MG/ML IV SOLN: 1000.0000 mg | Freq: Once | INTRAVENOUS | Status: DC | PRN

## 2021-05-26 MED ORDER — METOCLOPRAMIDE HCL 5 MG/ML IJ SOLN: 5.0000 mg | Freq: Three times a day (TID) | INTRAMUSCULAR | Status: DC | PRN

## 2021-05-26 MED ORDER — ROPIVACAINE HCL 5 MG/ML IJ SOLN
INTRAMUSCULAR | Status: DC | PRN
  Administered 2021-05-26: 30 mL via PERINEURAL

## 2021-05-26 MED ORDER — 0.9 % SODIUM CHLORIDE (POUR BTL) OPTIME
TOPICAL | Status: DC | PRN
  Administered 2021-05-26: 1000 mL

## 2021-05-26 MED ORDER — BUPIVACAINE-MELOXICAM ER 400-12 MG/14ML IJ SOLN
INTRAMUSCULAR | Status: AC
  Filled 2021-05-26: qty 1

## 2021-05-26 SURGICAL SUPPLY — 84 items
ALCOHOL 70% 16 OZ (MISCELLANEOUS) ×2 IMPLANT
BAG COUNTER SPONGE SURGICOUNT (BAG) IMPLANT
BAG DECANTER FOR FLEXI CONT (MISCELLANEOUS) ×2 IMPLANT
BANDAGE ESMARK 6X9 LF (GAUZE/BANDAGES/DRESSINGS) IMPLANT
BLADE SAG 18X100X1.27 (BLADE) ×2 IMPLANT
BNDG ESMARK 6X9 LF (GAUZE/BANDAGES/DRESSINGS)
BOWL SMART MIX CTS (DISPOSABLE) ×2 IMPLANT
CEMENT BONE REFOBACIN R1X40 US (Cement) ×2 IMPLANT
CLSR STERI-STRIP ANTIMIC 1/2X4 (GAUZE/BANDAGES/DRESSINGS) ×2 IMPLANT
COMP FEM CMT PERSONA SZ9 RT (Joint) ×2 IMPLANT
COMPONENT FEM CMT PRSONA SZ9RT (Joint) IMPLANT
COOLER ICEMAN CLASSIC (MISCELLANEOUS) ×3 IMPLANT
COVER SURGICAL LIGHT HANDLE (MISCELLANEOUS) ×2 IMPLANT
CUFF TOURN SGL QUICK 34 (TOURNIQUET CUFF) ×1
CUFF TOURN SGL QUICK 42 (TOURNIQUET CUFF) IMPLANT
CUFF TRNQT CYL 34X4.125X (TOURNIQUET CUFF) ×1 IMPLANT
DERMABOND ADVANCED (GAUZE/BANDAGES/DRESSINGS) ×1
DERMABOND ADVANCED .7 DNX12 (GAUZE/BANDAGES/DRESSINGS) ×1 IMPLANT
DRAPE EXTREMITY T 121X128X90 (DISPOSABLE) ×2 IMPLANT
DRAPE HALF SHEET 40X57 (DRAPES) ×2 IMPLANT
DRAPE INCISE IOBAN 66X45 STRL (DRAPES) ×2 IMPLANT
DRAPE ORTHO SPLIT 77X108 STRL (DRAPES) ×2
DRAPE POUCH INSTRU U-SHP 10X18 (DRAPES) ×2 IMPLANT
DRAPE SURG ORHT 6 SPLT 77X108 (DRAPES) ×2 IMPLANT
DRAPE U-SHAPE 47X51 STRL (DRAPES) ×4 IMPLANT
DRSG AQUACEL AG ADV 3.5X10 (GAUZE/BANDAGES/DRESSINGS) ×2 IMPLANT
DURAPREP 26ML APPLICATOR (WOUND CARE) ×6 IMPLANT
ELECT CAUTERY BLADE 6.4 (BLADE) ×2 IMPLANT
ELECT REM PT RETURN 9FT ADLT (ELECTROSURGICAL) ×2
ELECTRODE REM PT RTRN 9FT ADLT (ELECTROSURGICAL) ×1 IMPLANT
GLOVE BIOGEL PI IND STRL 7.0 (GLOVE) ×1 IMPLANT
GLOVE BIOGEL PI IND STRL 7.5 (GLOVE) ×4 IMPLANT
GLOVE BIOGEL PI INDICATOR 7.0 (GLOVE) ×1
GLOVE BIOGEL PI INDICATOR 7.5 (GLOVE) ×4
GLOVE ECLIPSE 7.0 STRL STRAW (GLOVE) ×6 IMPLANT
GLOVE SKINSENSE NS SZ7.5 (GLOVE) ×3
GLOVE SKINSENSE STRL SZ7.5 (GLOVE) ×3 IMPLANT
GLOVE SURG UNDER LTX SZ7.5 (GLOVE) ×4 IMPLANT
GLOVE SURG UNDER POLY LF SZ7 (GLOVE) ×4 IMPLANT
GOWN STRL REIN XL XLG (GOWN DISPOSABLE) ×2 IMPLANT
GOWN STRL REUS W/ TWL LRG LVL3 (GOWN DISPOSABLE) ×1 IMPLANT
GOWN STRL REUS W/TWL LRG LVL3 (GOWN DISPOSABLE) ×1
HANDPIECE INTERPULSE COAX TIP (DISPOSABLE) ×1
HDLS TROCR DRIL PIN KNEE 75 (PIN) ×4
HOOD PEEL AWAY FLYTE STAYCOOL (MISCELLANEOUS) ×4 IMPLANT
INSERT TIB ASF PS 14 8-11 (Insert) ×1 IMPLANT
JET LAVAGE IRRISEPT WOUND (IRRIGATION / IRRIGATOR)
KIT BASIN OR (CUSTOM PROCEDURE TRAY) ×2 IMPLANT
KIT TURNOVER KIT B (KITS) ×2 IMPLANT
LAVAGE JET IRRISEPT WOUND (IRRIGATION / IRRIGATOR) ×1 IMPLANT
MANIFOLD NEPTUNE II (INSTRUMENTS) ×2 IMPLANT
MARKER SKIN DUAL TIP RULER LAB (MISCELLANEOUS) ×4 IMPLANT
NDL SPNL 18GX3.5 QUINCKE PK (NEEDLE) ×1 IMPLANT
NEEDLE SPNL 18GX3.5 QUINCKE PK (NEEDLE) ×2 IMPLANT
NS IRRIG 1000ML POUR BTL (IV SOLUTION) ×2 IMPLANT
PACK TOTAL JOINT (CUSTOM PROCEDURE TRAY) ×2 IMPLANT
PAD ARMBOARD 7.5X6 YLW CONV (MISCELLANEOUS) ×4 IMPLANT
PAD COLD SHLDR UNI WRAP-ON (PAD) ×2
PAD COLD SHLDR WRAP-ON (PAD) ×2 IMPLANT
PAD COLD UNI WRAP-ON (PAD) IMPLANT
PIN DRILL HDLS TROCAR 75 4PK (PIN) IMPLANT
SAW OSC TIP CART 19.5X105X1.3 (SAW) ×2 IMPLANT
SCREW FEMALE HEX FIX 25X2.5 (ORTHOPEDIC DISPOSABLE SUPPLIES) ×1 IMPLANT
SET HNDPC FAN SPRY TIP SCT (DISPOSABLE) ×1 IMPLANT
SOLUTION PRONTOSAN WOUND 350ML (IRRIGATION / IRRIGATOR) ×1 IMPLANT
STAPLER VISISTAT 35W (STAPLE) IMPLANT
STEM POLY PAT PLY 35M KNEE (Knees) ×1 IMPLANT
STEM TIBIA 5 DEG SZ G R KNEE (Knees) IMPLANT
SUCTION FRAZIER HANDLE 10FR (MISCELLANEOUS) ×1
SUCTION TUBE FRAZIER 10FR DISP (MISCELLANEOUS) ×1 IMPLANT
SUT ETHILON 2 0 FS 18 (SUTURE) IMPLANT
SUT MNCRL AB 3-0 PS2 27 (SUTURE) IMPLANT
SUT VIC AB 0 CT1 27 (SUTURE) ×2
SUT VIC AB 0 CT1 27XBRD ANBCTR (SUTURE) ×2 IMPLANT
SUT VIC AB 1 CTX 27 (SUTURE) ×6 IMPLANT
SUT VIC AB 2-0 CT1 27 (SUTURE) ×4
SUT VIC AB 2-0 CT1 TAPERPNT 27 (SUTURE) ×4 IMPLANT
SYR 50ML LL SCALE MARK (SYRINGE) ×4 IMPLANT
TIBIA STEM 5 DEG SZ G R KNEE (Knees) ×2 IMPLANT
TOWEL GREEN STERILE (TOWEL DISPOSABLE) ×2 IMPLANT
TOWEL GREEN STERILE FF (TOWEL DISPOSABLE) ×2 IMPLANT
TRAY CATH 16FR W/PLASTIC CATH (SET/KITS/TRAYS/PACK) IMPLANT
UNDERPAD 30X36 HEAVY ABSORB (UNDERPADS AND DIAPERS) ×2 IMPLANT
YANKAUER SUCT BULB TIP NO VENT (SUCTIONS) ×4 IMPLANT

## 2021-05-26 NOTE — Evaluation (Signed)
Physical Therapy Evaluation ?Patient Details ?Name: Clayton James ?MRN: [id number] ?DOB: 01-Oct-1950 ?Today's Date: 05/26/2021 ? ?History of Present Illness ? Pt is a 71 y.o. M who presents s/p R TKA 05/26/2021. Significant PMH: DM2, CKD 3a.  ?Clinical Impression ? PTA, pt lives with his spouse and is independent. Pt verbalizing good pain control and is overall mobilizing fairly well POD #0. Initiated review of HEP for right knee strengthening and ROM (written instruction provided). Pt ambulating 50 ft with a walker, utilizing a step to pattern. Suspect good progress. Will need further gait and stair training prior to discharge.  ?   ? ?Recommendations for follow up therapy are one component of a multi-disciplinary discharge planning process, led by the attending physician.  Recommendations may be updated based on patient status, additional functional criteria and insurance authorization. ? ?Follow Up Recommendations Follow physician's recommendations for discharge plan and follow up therapies ? ?  ?Assistance Recommended at Discharge PRN  ?Patient can return home with the following ? A little help with walking and/or transfers;A little help with bathing/dressing/bathroom;Assistance with cooking/housework;Assist for transportation;Help with stairs or ramp for entrance ? ?  ?Equipment Recommendations None recommended by PT (pt already has RW)  ?Recommendations for Other Services ?    ?  ?Functional Status Assessment Patient has had a recent decline in their functional status and demonstrates the ability to make significant improvements in function in a reasonable and predictable amount of time.  ? ?  ?Precautions / Restrictions Precautions ?Precautions: Fall ?Restrictions ?Weight Bearing Restrictions: No  ? ?  ? ?Mobility ? Bed Mobility ?Overal bed mobility: Modified Independent ?  ?  ?  ?  ?  ?  ?  ?  ? ?Transfers ?Overall transfer level: Needs assistance ?Equipment used: Rolling walker (2 wheels) ?Transfers: Sit to/from  Stand ?Sit to Stand: Min assist ?  ?  ?  ?  ?  ?General transfer comment: Light minA to boost up to standing position from edge of bed ?  ? ?Ambulation/Gait ?Ambulation/Gait assistance: Min guard ?Gait Distance (Feet): 50 Feet ?Assistive device: Rolling walker (2 wheels) ?Gait Pattern/deviations: Step-to pattern, Decreased dorsiflexion - right, Antalgic, Decreased weight shift to right, Decreased stance time - right, Decreased step length - left ?Gait velocity: decreased ?  ?  ?General Gait Details: Cues for neutral R foot positioning, walker use, sequencing/technique. Pt with step to pattern, increased R foot external rotation. ? ?Stairs ?  ?  ?  ?  ?  ? ?Wheelchair Mobility ?  ? ?Modified Rankin (Stroke Patients Only) ?  ? ?  ? ?Balance Overall balance assessment: Needs assistance ?Sitting-balance support: Feet supported ?Sitting balance-Leahy Scale: Good ?  ?  ?Standing balance support: Bilateral upper extremity supported ?Standing balance-Leahy Scale: Poor ?Standing balance comment: reliant on RW ?  ?  ?  ?  ?  ?  ?  ?  ?  ?  ?  ?   ? ? ? ?Pertinent Vitals/Pain Pain Assessment ?Pain Assessment: Faces ?Faces Pain Scale: Hurts little more ?Pain Location: R knee ?Pain Descriptors / Indicators: Tightness ?Pain Intervention(s): Limited activity within patient's tolerance, Monitored during session  ? ? ?Home Living Family/patient expects to be discharged to:: Private residence ?Living Arrangements: Spouse/significant other ?Available Help at Discharge: Family ?Type of Home: House ?Home Access: Stairs to enter ?Entrance Stairs-Rails: Right;Left ?Entrance Stairs-Number of Steps: 3 ?  ?Home Layout: One level ?Home Equipment: Conservation officer, nature (2 wheels) ?Additional Comments: Has dog - pit mix  ?  ?Prior  Function Prior Level of Function : Independent/Modified Independent ?  ?  ?  ?  ?  ?  ?Mobility Comments: Retired Building control surveyor ?  ?  ? ? ?Hand Dominance  ?   ? ?  ?Extremity/Trunk Assessment  ? Upper Extremity Assessment ?Upper  Extremity Assessment: Defer to OT evaluation ?  ? ?Lower Extremity Assessment ?Lower Extremity Assessment: RLE deficits/detail ?RLE Deficits / Details: S/p TKA. At least 3/5 strength ?  ? ?   ?Communication  ? Communication: HOH  ?Cognition Arousal/Alertness: Awake/alert ?Behavior During Therapy: [facility] for tasks assessed/performed ?Overall Cognitive Status: Within Functional Limits for tasks assessed ?  ?  ?  ?  ?  ?  ?  ?  ?  ?  ?  ?  ?  ?  ?  ?  ?  ?  ?  ? ?  ?General Comments   ? ?  ?Exercises Total Joint Exercises ?Ankle Circles/Pumps: Both, 20 reps, Supine ?Quad Sets: Both, 10 reps, Supine ?Long Arc Quad: Right, 10 reps, Seated  ? ?Assessment/Plan  ?  ?PT Assessment Patient needs continued PT services  ?PT Problem List Decreased strength;Decreased range of motion;Decreased balance;Decreased mobility;Pain ? ?   ?  ?PT Treatment Interventions DME instruction;Gait training;Stair training;Functional mobility training;Therapeutic exercise;Therapeutic activities;Balance training;Patient/family education   ? ?PT Goals (Current goals can be found in the Care Plan section)  ?Acute Rehab PT Goals ?Patient Stated Goal: "do as much as I can." ?PT Goal Formulation: With patient ?Time For Goal Achievement: 06/09/21 ?Potential to Achieve Goals: Good ? ?  ?Frequency 7X/week ?  ? ? ?Co-evaluation   ?  ?  ?  ?  ? ? ?  ?AM-PAC PT "6 Clicks" Mobility  ?Outcome Measure Help needed turning from your back to your side while in a flat bed without using bedrails?: None ?Help needed moving from lying on your back to sitting on the side of a flat bed without using bedrails?: None ?Help needed moving to and from a bed to a chair (including a wheelchair)?: A Little ?Help needed standing up from a chair using your arms (e.g., wheelchair or bedside chair)?: A Little ?Help needed to walk in hospital room?: A Little ?Help needed climbing 3-5 steps with a railing? : A Little ?6 Click Score: 20 ? ?  ?End of Session Equipment Utilized During  Treatment: Gait belt ?Activity Tolerance: Patient tolerated treatment well ?Patient left: in chair;with call bell/phone within reach;with family/visitor present ?Nurse Communication: Mobility status ?PT Visit Diagnosis: Other abnormalities of gait and mobility (R26.89);Difficulty in walking, not elsewhere classified (R26.2);Pain ?Pain - Right/Left: Right ?Pain - part of body: Knee ?  ? ?Time: 9211-9417 ?PT Time Calculation (min) (ACUTE ONLY): 24 min ? ? ?Charges:   PT Evaluation ?$PT Eval Low Complexity: 1 Low ?PT Treatments ?$Gait Training: 8-22 mins ?  ?   ? ? ?Wyona Almas, PT, DPT ?Acute Rehabilitation Services ?Pager [phone] ?Office [phone] ? ? ?Deno Etienne ?05/26/2021, 5:10 PM ? ?

## 2021-05-26 NOTE — Transfer of Care (Signed)
Immediate Anesthesia Transfer of Care Note ? ?Patient: Clayton James ? ?Procedure(s) Performed: RIGHT TOTAL KNEE ARTHROPLASTY (Right: Knee) ? ?Patient Location: PACU ? ?Anesthesia Type:Spinal and MAC combined with regional for post-op pain ? ?Level of Consciousness: awake, alert  and oriented ? ?Airway & Oxygen Therapy: Patient Spontanous Breathing ? ?Post-op Assessment: Report given to RN and Post -op Vital signs reviewed and stable ? ?Post vital signs: Reviewed and stable ? ?Last Vitals:  ?Vitals Value Taken Time  ?BP 107/62 05/26/21 1317  ?Temp    ?Pulse 65 05/26/21 1319  ?Resp 13 05/26/21 1319  ?SpO2 90 % 05/26/21 1319  ?Vitals shown include unvalidated device data. ? ?Last Pain:  ?Vitals:  ? 05/26/21 0833  ?TempSrc:   ?PainSc: 3   ?   ? ?Patients Stated Pain Goal: 2 (05/26/21 9688) ? ?Complications: No notable events documented. ?

## 2021-05-26 NOTE — H&P (Signed)
? ? ?PREOPERATIVE H&P ? ?Chief Complaint: right knee degenerative joint disease ? ?HPI: ?Clayton James is a 71 y.o. male who presents for surgical treatment of right knee degenerative joint disease.  He denies any changes in medical history. ? ?Past Medical History:  ?Diagnosis Date  ? Arthritis   ? Benign essential hypertension 04/27/2019  ? Chronic kidney disease, stage 3a ([place]) 04/27/2019  ? Mixed hyperlipidemia 04/27/2019  ? Type 2 diabetes mellitus with diabetic polyneuropathy ([place]) 04/27/2019  ? Type 2 diabetes mellitus with stage 3 chronic kidney disease ([place]) 04/27/2019  ? ?Past Surgical History:  ?Procedure Laterality Date  ? ELBOW SURGERY Left 1994  ? KNEE ARTHROSCOPY Right 1998  ? KNEE CARTILAGE SURGERY Left 1994  ? ?Social History  ? ?Socioeconomic History  ? Marital status: Married  ?  Spouse name: Not on file  ? Number of children: 3  ? Years of education: Not on file  ? Highest education level: Not on file  ?Occupational History  ? Not on file  ?Tobacco Use  ? Smoking status: Former  ?  Years: 8.00  ?  Types: Cigarettes  ?  Quit date: 2000  ?  Years since quitting: 23.3  ? Smokeless tobacco: Current  ?  Types: Chew  ? Tobacco comments:  ?  1 can per week  ?Vaping Use  ? Vaping Use: Never used  ?Substance and Sexual Activity  ? Alcohol use: Yes  ?  Alcohol/week: 7.0 standard drinks  ?  Types: 7 Cans of beer per week  ?  Comment: one beer or one glass of wine per day  ? Drug use: Never  ? Sexual activity: Yes  ?  Partners: Female  ?Other Topics Concern  ? Not on file  ?Social History Narrative  ? Oldest son and youngest son passed away  ? ?Social Determinants of Health  ? ?Financial Resource Strain: Low Risk   ? Difficulty of Paying Living Expenses: Not hard at all  ?Food Insecurity: Not on file  ?Transportation Needs: No Transportation Needs  ? Lack of Transportation (Medical): No  ? Lack of Transportation (Non-Medical): No  ?Physical Activity: Not on file  ?Stress: Not on file  ?Social Connections:  Not on file  ? ?Family History  ?Problem Relation Age of Onset  ? Diabetes Mother   ? Kidney disease Mother   ? Heart disease Father   ? ?Allergies  ?Allergen Reactions  ? Sulfamethoxazole Rash  ? ?Prior to Admission medications   ?Medication Sig Start Date End Date Taking? Authorizing Provider  ?acetaminophen-codeine (TYLENOL #3) 300-30 MG tablet Take 1 tablet by mouth in the morning and at bedtime. 05/01/21  Yes [provider]  ?ALPRAZolam Duanne Moron) 0.5 MG tablet Take 1 tablet by mouth twice daily as needed 04/21/21  Yes Lillard Anes, MD  ?amLODipine-benazepril (LOTREL) 10-20 MG capsule Take 1 capsule by mouth once daily 01/08/21  Yes Lillard Anes, MD  ?aspirin EC 81 MG tablet Take 1 tablet (81 mg total) by mouth 2 (two) times daily. To be taken after surgery to prevent blood clots 05/19/21   Aundra Dubin, PA-C  ?atorvastatin (LIPITOR) 40 MG tablet Take 1 tablet (40 mg total) by mouth daily. 03/27/21  Yes Lillard Anes, MD  ?cephALEXin (KEFLEX) 500 MG capsule Take 1 capsule (500 mg total) by mouth 4 (four) times daily. To be taken after surgery 05/19/21   Aundra Dubin, PA-C  ?diclofenac Sodium (VOLTAREN) 1 % GEL Apply 2 g topically  daily as needed (pain).   Yes [provider]  ?Diclofenac Sodium CR 100 MG 24 hr tablet Take 1 tablet by mouth once daily 08/27/20  Yes Lillard Anes, MD  ?docusate sodium (COLACE) 100 MG capsule Take 1 capsule (100 mg total) by mouth daily as needed. 05/19/21 05/19/22  Aundra Dubin, PA-C  ?FARXIGA 10 MG TABS tablet Take 1 tablet by mouth once daily 04/02/20  Yes Lillard Anes, MD  ?fluticasone [facility]) 50 MCG/ACT nasal spray Place 2 sprays into both nostrils daily as needed for allergies or rhinitis.   Yes [provider]  ?metFORMIN (GLUCOPHAGE) 1000 MG tablet Take 1 tablet by mouth twice daily 11/28/20  Yes Lillard Anes, MD  ?methocarbamol (ROBAXIN-750) 750 MG tablet Take 1 tablet (750 mg total) by  mouth 2 (two) times daily as needed for muscle spasms. 05/19/21   Aundra Dubin, PA-C  ?ondansetron (ZOFRAN) 4 MG tablet Take 1 tablet (4 mg total) by mouth every 8 (eight) hours as needed for nausea or vomiting. 05/19/21   Aundra Dubin, PA-C  ?oxyCODONE-acetaminophen (PERCOCET) 5-325 MG tablet Take 1 tablet by mouth every 6 (six) hours as needed. To be taken after surgery 05/19/21   Aundra Dubin, PA-C  ?tamsulosin (FLOMAX) 0.4 MG CAPS capsule Take 1 capsule (0.4 mg total) by mouth daily. 03/20/21  Yes Lillard Anes, MD  ?TRESIBA FLEXTOUCH 100 UNIT/ML FlexTouch Pen INJECT 30 UNITS SUBCUTANEOUSLY ONCE DAILY 10/11/20  Yes Lillard Anes, MD  ?Blood Glucose Monitoring Suppl (ONE TOUCH ULTRA 2) w/Device KIT USE TO CHECK GLUCOSE THREE TIMES DAILY 01/20/19   [provider]  ?Insulin Pen Needle (BD PEN NEEDLE MICRO U/F) 32G X 6 MM MISC USE AS DIRECTED ONCE DAILY 12/02/20   Lillard Anes, MD  ?sildenafil (VIAGRA) 100 MG tablet Take 1 tablet (100 mg total) by mouth daily as needed for erectile dysfunction. 06/20/20   Lillard Anes, MD  ? ? ? ?Positive ROS: All other systems have been reviewed and were otherwise negative with the exception of those mentioned in the HPI and as above. ? ?Physical Exam: ?General: Alert, no acute distress ?Cardiovascular: No pedal edema ?Respiratory: No cyanosis, no use of accessory musculature ?GI: abdomen soft ?Skin: No lesions in the area of chief complaint ?Neurologic: Sensation intact distally ?Psychiatric: Patient is competent for consent with normal mood and affect ?Lymphatic: no lymphedema ? ?MUSCULOSKELETAL: exam stable ? ?Assessment: ?right knee degenerative joint disease ? ?Plan: ?Plan for Procedure(s): ?RIGHT TOTAL KNEE ARTHROPLASTY ? ?The risks benefits and alternatives were discussed with the patient including but not limited to the risks of nonoperative treatment, versus surgical intervention including infection, bleeding, nerve injury,   blood clots, cardiopulmonary complications, morbidity, mortality, among others, and they were willing to proceed.  ? ?Preoperative templating of the joint replacement has been completed, documented, and submitted to the Operating Room personnel in order to optimize intra-operative equipment management. ? ? ?Eduard Roux, MD ?05/26/2021 ?8:56 AM ? ?

## 2021-05-26 NOTE — Anesthesia Preprocedure Evaluation (Addendum)
Anesthesia Evaluation  ?Patient identified by MRN, date of birth, ID band ?Patient awake ? ? ? ?Reviewed: ?Allergy & Precautions, NPO status , Patient's Chart, lab work & pertinent test results ? ?Airway ?Mallampati: II ? ?TM Distance: >3 FB ?Neck ROM: Full ? ? ? Dental ? ?(+) Teeth Intact, Dental Advisory Given ?  ?Pulmonary ?former smoker,  ?  ?breath sounds clear to auscultation ? ? ? ? ? ? Cardiovascular ?hypertension, Pt. on medications ?+ Peripheral Vascular Disease  ? ?Rhythm:Regular Rate:Normal ? ? ?  ?Neuro/Psych ?negative psych ROS  ? GI/Hepatic ?negative GI ROS, Neg liver ROS,   ?Endo/Other  ?diabetes, Type 2, Oral Hypoglycemic Agents ? Renal/GU ?Renal disease  ? ?  ?Musculoskeletal ? ?(+) Arthritis ,  ? Abdominal ?  ?Peds ? Hematology ?negative hematology ROS ?(+)   ?Anesthesia Other Findings ? ? Reproductive/Obstetrics ? ?  ? ? ? ? ? ? ? ? ? ? ? ? ? ?  ?  ? ? ? ? ? ? ? ?Anesthesia Physical ?Anesthesia Plan ? ?ASA: 2 ? ?Anesthesia Plan: Spinal  ? ?Post-op Pain Management: Regional block*  ? ?Induction: Intravenous ? ?PONV Risk Score and Plan: 2 and Ondansetron, Propofol infusion, Dexamethasone and Treatment may vary due to age or medical condition ? ?Airway Management Planned: Simple Face Mask and Natural Airway ? ?Additional Equipment: None ? ?Intra-op Plan:  ? ?Post-operative Plan:  ? ?Informed Consent: I have reviewed the patients History and Physical, chart, labs and discussed the procedure including the risks, benefits and alternatives for the proposed anesthesia with the patient or authorized representative who has indicated his/her understanding and acceptance.  ? ? ? ? ? ?Plan Discussed with: CRNA ? ?Anesthesia Plan Comments: (Lab Results ?     Component                Value               Date                 ?     WBC                      9.7                 05/20/2021           ?     HGB                      16.6                05/20/2021           ?     HCT                       48.4                05/20/2021           ?     MCV                      94.2                05/20/2021           ?     PLT                      307  05/20/2021           ?)  ? ? ? ? ? ?Anesthesia Quick Evaluation ? ?

## 2021-05-26 NOTE — Anesthesia Procedure Notes (Signed)
Anesthesia Regional Block: Adductor canal block  ? ?Pre-Anesthetic Checklist: , timeout performed,  Correct Patient, Correct Site, Correct Laterality,  Correct Procedure, Correct Position, site marked,  Risks and benefits discussed,  Surgical consent,  Pre-op evaluation,  At surgeon's request and post-op pain management ? ?Laterality: Right ? ?Prep: chloraprep     ?  ?Needles:  ?Injection technique: Single-shot ? ?Needle Type: Echogenic Stimulator Needle   ? ? ?Needle Length: 9cm  ?Needle Gauge: 21  ? ? ? ?Additional Needles: ? ? ?Procedures:,,,, ultrasound used (permanent image in chart),,    ?Narrative:  ?Start time: 05/26/2021 9:50 AM ?End time: 05/26/2021 9:55 AM ?Injection made incrementally with aspirations every 5 mL. ? ?Performed by: Personally  ?Anesthesiologist: Effie Berkshire, MD ? ?Additional Notes: ?Patient tolerated the procedure well. Local anesthetic introduced in an incremental fashion under minimal resistance after negative aspirations. No paresthesias were elicited. After completion of the procedure, no acute issues were identified and patient continued to be monitored by RN.  ? ? ? ? ? ?

## 2021-05-26 NOTE — Discharge Instructions (Signed)

## 2021-05-26 NOTE — Plan of Care (Signed)
?  Problem: Safety: ?Goal: Ability to remain free from injury will improve ?Outcome: Completed/Met ?  ?Problem: Education: ?Goal: Knowledge of the prescribed therapeutic regimen will improve ?Outcome: Completed/Met ?Goal: Individualized Educational Video(s) ?Outcome: Completed/Met ?  ?Problem: Activity: ?Goal: Ability to avoid complications of mobility impairment will improve ?Outcome: Completed/Met ?Goal: Range of joint motion will improve ?Outcome: Completed/Met ?  ?Problem: Clinical Measurements: ?Goal: Postoperative complications will be avoided or minimized ?Outcome: Completed/Met ?  ?Problem: Pain Management: ?Goal: Pain level will decrease with appropriate interventions ?Outcome: Completed/Met ?  ?Problem: Skin Integrity: ?Goal: Will show signs of wound healing ?Outcome: Completed/Met ?  ?Problem: Acute Rehab PT Goals(only PT should resolve) ?Goal: Patient Will Transfer Sit To/From Stand ?Outcome: Completed/Met ?Goal: Pt Will Ambulate ?Outcome: Completed/Met ?Goal: Pt Will Go Up/Down Stairs ?Outcome: Completed/Met ?Goal: Pt/caregiver will Perform Home Exercise Program ?Outcome: Completed/Met ?  ?

## 2021-05-26 NOTE — Anesthesia Procedure Notes (Signed)
Spinal ? ?Patient location during procedure: OR ?Start time: 05/26/2021 11:11 AM ?End time: 05/26/2021 11:13 AM ?Reason for block: surgical anesthesia ?Staffing ?Performed: anesthesiologist  ?Anesthesiologist: Effie Berkshire, MD ?Preanesthetic Checklist ?Completed: patient identified, IV checked, site marked, risks and benefits discussed, surgical consent, monitors and equipment checked, pre-op evaluation and timeout performed ?Spinal Block ?Patient position: sitting ?Prep: DuraPrep and site prepped and draped ?Location: L3-4 ?Injection technique: single-shot ?Needle ?Needle type: Pencan  ?Needle gauge: 24 G ?Needle length: 10 cm ?Needle insertion depth: 10 cm ?Additional Notes ?Patient tolerated well. No immediate complications. ? ?Functioning IV was confirmed and monitors were applied. Sterile prep and drape, including hand hygiene and sterile gloves were used. The patient was positioned and the back was prepped. The skin was anesthetized with lidocaine. Free flow of clear CSF was obtained prior to injecting local anesthetic into the CSF. The spinal needle aspirated freely following injection. The needle was carefully withdrawn. The patient tolerated the procedure well.; ? ? ? ? ?

## 2021-05-26 NOTE — Op Note (Signed)
? ?Total Knee Arthroplasty Procedure Note ? ?Preoperative diagnosis: Right knee osteoarthritis ? ?Postoperative diagnosis:same ? ?Operative procedure: Right total knee arthroplasty. CPT [phone] ? ?Surgeon: N. Eduard Roux, MD ? ?Assist: Madalyn Rob, PA-C; necessary for the timely completion of procedure and due to complexity of procedure. ? ?Anesthesia: Spinal, regional, local ? ?Tourniquet time: see anesthesia record ? ?Implants used: Zimmer persona  ?Femur: CR 9 ?Tibia: G ?Patella: 35 mm ?Polyethylene: 14 mm, MC ? ?Indication: ?RAHKEEM SENFT is a 71 y.o. year old male with a history of knee pain. Having failed conservative management, the patient elected to proceed with a total knee arthroplasty.  We have reviewed the risk and benefits of the surgery and they elected to proceed after voicing understanding. ? ?Procedure: ? ?After informed consent was obtained and understanding of the risk were voiced including but not limited to bleeding, infection, damage to surrounding structures including nerves and vessels, blood clots, leg length inequality and the failure to achieve desired results, the operative extremity was marked with verbal confirmation of the patient in the holding area.   ?The patient was then brought to the operating room and transported to the operating room table in the supine position.  A tourniquet was applied to the operative extremity around the upper thigh. The operative limb was then prepped and draped in the usual sterile fashion and preoperative antibiotics were administered.  A time out was performed prior to the start of surgery confirming the correct extremity, preoperative antibiotic administration, as well as team members, implants and instruments available for the case. Correct surgical site was also confirmed with preoperative radiographs. ?The limb was then elevated for exsanguination and the tourniquet was inflated. A midline incision was made and a standard medial  parapatellar approach was performed.  The infrapatellar fat pad was removed.  Suprapatellar synovium was removed to reveal the anterior distal femoral cortex.  A medial peel was performed to release the capsule of the medial tibial plateau.  The patella was then everted and was prepared and sized to a 35 mm.  A cover was placed on the patella for protection from retractors.  The knee was then brought into full flexion and we then turned our attention to the femur.  The cruciates were sacrificed.  Start site was drilled in the femur and the intramedullary distal femoral cutting guide was placed, set at 3 degrees valgus, taking 10 mm of distal resection. The distal cut was made. Osteophytes were then removed.  Next, the proximal tibial cutting guide was placed with appropriate slope, varus/valgus alignment and depth of resection. The proximal tibial cut was made taking 4 mm off the low side. Gap blocks were then used to assess the extension gap and alignment, and appropriate soft tissue releases were performed. ?Attention was turned back to the femur, which was sized using the sizing guide to a size 9. Appropriate rotation of the femoral component was determined using epicondylar axis, Whiteside?s line, and assessing the flexion gap under ligament tension. The appropriate size 4-in-1 cutting block was placed and checked with an angel wing and cuts were made. Posterior femoral osteophytes and uncapped bone were then removed with the curved osteotome.  Trial components were placed, and stability was checked in full extension, mid-flexion, and deep flexion. Proper tibial rotation was determined and marked.  The patella tracked well without a lateral release.  The femoral lugs were then drilled. ?Trial components were then removed and tibial preparation performed.  The tibia was sized for  a size G component.   ?The bony surfaces were irrigated with a pulse lavage and then dried. Bone cement was vacuum mixed on the back  table, and the final components sized above were cemented into place.  Antibiotic irrigation was placed in the knee joint and soft tissues while the cement cured.  After cement had finished curing, excess cement was removed. The stability of the construct was re-evaluated throughout a range of motion and found to be acceptable. The trial liner was removed, the knee was copiously irrigated, and the knee was re-evaluated for any excess bone debris. The real polyethylene liner, 14 mm thick, was inserted and checked to ensure the locking mechanism had engaged appropriately. The tourniquet was deflated and hemostasis was achieved. The wound was irrigated with normal saline.  One gram of vancomycin powder was placed in the surgical bed.  Topical 0.25% bupivacaine and meloxicam was placed in the joint for postoperative pain.  Capsular closure was performed with a #1 vicryl, subcutaneous fat closed with a 0 vicryl suture, then subcutaneous tissue closed with interrupted 2.0 vicryl suture. The skin was then closed with a 2.0 nylon and dermabond. A sterile dressing was applied.  The patient was awakened in the operating room and taken to recovery in stable condition. All sponge, needle, and instrument counts were correct at the end of the case. ? ?Tawanna Cooler was necessary for opening, closing, retracting, limb positioning and overall facilitation and completion of the surgery. ? ?Position: supine  ?Complications: none.  ?Time Out: performed  ? ?Drains/Packing: none ? ?Estimated blood loss: minimal ? ?Returned to Recovery Room: in good condition.  ? ?Antibiotics: yes  ? ?Mechanical VTE (DVT) Prophylaxis: sequential compression devices, TED thigh-high  ?Chemical VTE (DVT) Prophylaxis: aspirin ? ?Fluid Replacement  ?Crystalloid: see anesthesia record ?Blood: none  ?FFP: none  ? ?Specimens Removed: 1 to pathology  ? ?Sponge and Instrument Count Correct? yes  ? ?PACU: portable radiograph - knee AP and Lateral  ? ?Plan/RTC:  Return in 2 weeks for wound check.  ? ?Weight Bearing/Load Lower Extremity: full  ? ?Implant Name Type Inv. Item Serial No. Manufacturer Lot No. LRB No. Used Action  ?CEMENT BONE REFOBACIN R1X40 [place] - [id number] Cement CEMENT BONE REFOBACIN R1X40 [place]  [employer](ORTH,TRAU,BIO,SG) [id number] Right 1 Implanted  ?CEMENT BONE REFOBACIN R1X40 [place] - [id number] Cement CEMENT BONE REFOBACIN R1X40 [place]  [employer](ORTH,TRAU,BIO,SG) [id number] Right 1 Implanted  ?INSERT TIB ASF PS 14 8-11 - [id number] Insert INSERT TIB ASF PS 14 8-11  [employer](ORTH,TRAU,BIO,SG) [id number] Right 1 Implanted  ?STEM POLY PAT PLY 5M KNEE - [id number] Knees STEM POLY PAT PLY 5M KNEE  [employer](ORTH,TRAU,BIO,SG) [id number] Right 1 Implanted  ?COMP FEM CMT PERSONA SZ9 RT - [id number] Joint COMP FEM CMT PERSONA SZ9 RT  [employer](ORTH,TRAU,BIO,SG) [id number] Right 1 Implanted  ?TIBIA STEM 5 DEG SZ G R KNEE - [id number] Knees TIBIA STEM 5 DEG SZ G R KNEE  [employer]ON(ORTH,TRAU,BIO,SG) [id number] Right 1 Implanted  ? ? ?N. Eduard Roux, MD ?Marga Hoots ?12:39 PM ? ? ?

## 2021-05-26 NOTE — Anesthesia Procedure Notes (Signed)
Procedure Name: [place] ?Date/Time: 05/26/2021 11:07 AM ?Performed by: Carolan Clines, CRNA ?Pre-anesthesia Checklist: Patient identified, Emergency Drugs available, Suction available and Patient being monitored ?Patient Re-evaluated:Patient Re-evaluated prior to induction ?Oxygen Delivery Method: Simple face mask ?Dental Injury: Teeth and Oropharynx as per pre-operative assessment  ? ? ? ? ?

## 2021-05-26 NOTE — TOC CM/SW Note (Addendum)
Dr Erlinda Hong office has arranged home health services with [facility].  ?  ?[facility] will follow for home health orders.  ?  ?[facility] staff will provide any needed DME. ? ? ?Transition of Care (TOC) Screening Note ? ? ?Patient Details  ?Name: Clayton James ?Date of Birth: 1950-12-02 ? ? ?Transition of Care Department [facility]) has reviewed patient and no TOC needs have been identified at this time. We will continue to monitor patient advancement through interdisciplinary progression rounds. If new patient transition needs arise, please place a TOC consult. ?  ?

## 2021-05-27 ENCOUNTER — Encounter (HOSPITAL_COMMUNITY): Payer: Self-pay | Admitting: Orthopaedic Surgery

## 2021-05-27 ENCOUNTER — Encounter: Payer: Self-pay | Admitting: Surgical

## 2021-05-27 ENCOUNTER — Other Ambulatory Visit: Payer: Self-pay | Admitting: Surgical

## 2021-05-27 DIAGNOSIS — I129 Hypertensive chronic kidney disease with stage 1 through stage 4 chronic kidney disease, or unspecified chronic kidney disease: Secondary | ICD-10-CM | POA: Diagnosis not present

## 2021-05-27 DIAGNOSIS — Z7984 Long term (current) use of oral hypoglycemic drugs: Secondary | ICD-10-CM | POA: Diagnosis not present

## 2021-05-27 DIAGNOSIS — N1831 Chronic kidney disease, stage 3a: Secondary | ICD-10-CM | POA: Diagnosis not present

## 2021-05-27 DIAGNOSIS — E1122 Type 2 diabetes mellitus with diabetic chronic kidney disease: Secondary | ICD-10-CM | POA: Diagnosis not present

## 2021-05-27 DIAGNOSIS — E1142 Type 2 diabetes mellitus with diabetic polyneuropathy: Secondary | ICD-10-CM | POA: Diagnosis not present

## 2021-05-27 DIAGNOSIS — Z96651 Presence of right artificial knee joint: Secondary | ICD-10-CM | POA: Diagnosis not present

## 2021-05-27 DIAGNOSIS — Z87891 Personal history of nicotine dependence: Secondary | ICD-10-CM | POA: Diagnosis not present

## 2021-05-27 DIAGNOSIS — Z79899 Other long term (current) drug therapy: Secondary | ICD-10-CM | POA: Diagnosis not present

## 2021-05-27 DIAGNOSIS — Z7982 Long term (current) use of aspirin: Secondary | ICD-10-CM | POA: Diagnosis not present

## 2021-05-27 DIAGNOSIS — M1711 Unilateral primary osteoarthritis, right knee: Secondary | ICD-10-CM | POA: Diagnosis not present

## 2021-05-27 LAB — CBC
HCT: 40.4 % (ref 39.0–52.0)
Hemoglobin: 13.6 g/dL (ref 13.0–17.0)
MCH: 31.7 pg (ref 26.0–34.0)
MCHC: 33.7 g/dL (ref 30.0–36.0)
MCV: 94.2 fL (ref 80.0–100.0)
Platelets: 228 10*3/uL (ref 150–400)
RBC: 4.29 MIL/uL (ref 4.22–5.81)
RDW: 13.2 % (ref 11.5–15.5)
WBC: 10.1 10*3/uL (ref 4.0–10.5)
nRBC: 0 % (ref 0.0–0.2)

## 2021-05-27 LAB — GLUCOSE, CAPILLARY: Glucose-Capillary: 315 mg/dL — ABNORMAL HIGH (ref 70–99)

## 2021-05-27 MED ORDER — CEPHALEXIN 500 MG PO CAPS: 500.0000 mg | ORAL_CAPSULE | Freq: Four times a day (QID) | ORAL | 0 refills | Status: DC

## 2021-05-27 NOTE — Progress Notes (Signed)
Physical Therapy Treatment ?Patient Details ?Name: Clayton James ?MRN: [id number] ?DOB: 12-02-1950 ?Today's Date: 05/27/2021 ? ? ?History of Present Illness Pt is a 71 y.o. M who presents s/p R TKA 05/26/2021. Significant PMH: DM2, CKD 3a. ? ?  ?PT Comments  ? ? Pt progressing towards physical therapy goals. Was able to perform transfers and ambulation with up to supervision for safety. Hands on guarding for safety during stair training. Pt reports feeling "numb" still and with little complaints throughout functional mobility. Educated pt on positioning recommendations and pain control techniques once numbness wears off. Will continue to follow and progress as able per POC. Pt does not require an additional PT session this afternoon, and pt is in agreement for return home this morning.  ?  ?Recommendations for follow up therapy are one component of a multi-disciplinary discharge planning process, led by the attending physician.  Recommendations may be updated based on patient status, additional functional criteria and insurance authorization. ? ?Follow Up Recommendations ? Follow physician's recommendations for discharge plan and follow up therapies ?  ?  ?Assistance Recommended at Discharge PRN  ?Patient can return home with the following A little help with walking and/or transfers;A little help with bathing/dressing/bathroom;Assistance with cooking/housework;Assist for transportation;Help with stairs or ramp for entrance ?  ?Equipment Recommendations ? None recommended by PT  ?  ?Recommendations for Other Services   ? ? ?  ?Precautions / Restrictions Precautions ?Precautions: Fall ?Precaution Comments: Pt still numb in the surgical knee. Reports a fall overnight with staff. ?Restrictions ?Weight Bearing Restrictions: No  ?  ? ?Mobility ? Bed Mobility ?  ?  ?  ?  ?  ?  ?  ?General bed mobility comments: up in chair upon arrival ?  ? ?Transfers ?Overall transfer level: Modified independent ?Equipment used: Rolling  walker (2 wheels), Rollator (4 wheels) ?Transfers: Sit to/from Stand ?  ?  ?  ?  ?  ?  ?General transfer comment: No assist required for pt to power up to full stand. Good hand placement on seated surface for safety. ?  ? ?Ambulation/Gait ?Ambulation/Gait assistance: Supervision ?Gait Distance (Feet): 450 Feet ?Assistive device: Rolling walker (2 wheels) ?Gait Pattern/deviations: Decreased dorsiflexion - right, Antalgic, Decreased weight shift to right, Decreased stance time - right, Decreased step length - left, Step-through pattern ?Gait velocity: decreased ?Gait velocity interpretation: 1.31 - 2.62 ft/sec, indicative of limited community ambulator ?  ?General Gait Details: VC's for sequencing and general safety with RW for support. Pt was able to progress to smooth gait pattern with increased heel strike. ? ? ?Stairs ?Stairs: Yes ?Stairs assistance: Min guard ?Stair Management: One rail Right, Step to pattern, Forwards ?Number of Stairs: 3 ?General stair comments: VC's for sequencing and general safety. ? ? ?Wheelchair Mobility ?  ? ?Modified Rankin (Stroke Patients Only) ?  ? ? ?  ?Balance Overall balance assessment: Needs assistance ?Sitting-balance support: Feet supported ?Sitting balance-Leahy Scale: Good ?  ?  ?Standing balance support: Bilateral upper extremity supported ?Standing balance-Leahy Scale: Fair ?Standing balance comment: stands without RW support for standing grooming task, can let go of RW with 1 hand without LOB ?  ?  ?  ?  ?  ?  ?  ?  ?  ?  ?  ?  ? ?  ?Cognition Arousal/Alertness: Awake/alert ?Behavior During Therapy: [facility] for tasks assessed/performed ?Overall Cognitive Status: Within Functional Limits for tasks assessed ?  ?  ?  ?  ?  ?  ?  ?  ?  ?  ?  ?  ?  ?  ?  ?  ?  ?  ?  ? ?  ?  Exercises Total Joint Exercises ?Ankle Circles/Pumps: 10 reps ?Quad Sets: 10 reps ?Towel Squeeze: 10 reps ?Short Arc Quad: 10 reps ?Hip ABduction/ADduction: 10 reps ?Long Arc Quad: 10 reps ?Goniometric ROM: 2-115?  AROM in sitting ? ?  ?General Comments General comments (skin integrity, edema, etc.): VSS on RA ?  ?  ? ?Pertinent Vitals/Pain Pain Assessment ?Pain Assessment: 0-10 ?Pain Score: 4  ?Pain Location: R knee ?Pain Descriptors / Indicators: Tightness ?Pain Intervention(s): Limited activity within patient's tolerance, Monitored during session, Repositioned  ? ? ?Home Living Family/patient expects to be discharged to:: Private residence ?Living Arrangements: Spouse/significant other ?Available Help at Discharge: Family ?Type of Home: House ?Home Access: Stairs to enter ?Entrance Stairs-Rails: Right;Left ?Entrance Stairs-Number of Steps: 3 ?  ?Home Layout: One level ?Home Equipment: Rolling Walker (2 wheels);BSC/3in1 ?Additional Comments: Has dog - pit mix  ?  ?Prior Function    ?  ?  ?   ? ?PT Goals (current goals can now be found in the care plan section) Acute Rehab PT Goals ?Patient Stated Goal: "do as much as I can." ?PT Goal Formulation: With patient ?Time For Goal Achievement: 06/09/21 ?Potential to Achieve Goals: Good ?Progress towards PT goals: Progressing toward goals ? ?  ?Frequency ? ? ? 7X/week ? ? ? ?  ?PT Plan Current plan remains appropriate  ? ? ?Co-evaluation   ?  ?  ?  ?  ? ?  ?AM-PAC PT "6 Clicks" Mobility   ?Outcome Measure ? Help needed turning from your back to your side while in a flat bed without using bedrails?: None ?Help needed moving from lying on your back to sitting on the side of a flat bed without using bedrails?: None ?Help needed moving to and from a bed to a chair (including a wheelchair)?: A Little ?Help needed standing up from a chair using your arms (e.g., wheelchair or bedside chair)?: A Little ?Help needed to walk in hospital room?: A Little ?Help needed climbing 3-5 steps with a railing? : A Little ?6 Click Score: 20 ? ?  ?End of Session Equipment Utilized During Treatment: Gait belt ?Activity Tolerance: Patient tolerated treatment well ?Patient left: in chair;with call bell/phone  within reach;with family/visitor present ?Nurse Communication: Mobility status ?PT Visit Diagnosis: Other abnormalities of gait and mobility (R26.89);Difficulty in walking, not elsewhere classified (R26.2);Pain ?Pain - Right/Left: Right ?Pain - part of body: Knee ?  ? ? ?Time: 0932-6712 ?PT Time Calculation (min) (ACUTE ONLY): 37 min ? ?Charges:  $Gait Training: 8-22 mins ?$Therapeutic Exercise: 8-22 mins          ?          ? ?Clayton James, PT, DPT ?Acute Rehabilitation Services ?Secure Chat Preferred ?Office: [phone]  ? ? ?Thelma Comp ?05/27/2021, 11:23 AM ? ?

## 2021-05-27 NOTE — Progress Notes (Signed)
Patient alert and oriented, voiding adequately, skin clean, dry and intact without evidence of skin break down, or symptoms of complications - no redness or edema noted, only slight tenderness at site.  Patient states pain is manageable at time of discharge. Patient has an appointment with MD in 2 weeks 

## 2021-05-27 NOTE — Discharge Summary (Signed)
Patient ID: ?Clayton James ?MRN: [id number] ?DOB/AGE: 1950-02-23 71 y.o. ? ?Admit date: 05/26/2021 ?Discharge date: 05/27/2021 ? ?Admission Diagnoses:  ?Principal Problem: ?  Primary osteoarthritis of right knee ?Active Problems: ?  Status post total right knee replacement ? ? ?Discharge Diagnoses:  ?Same ? ?Past Medical History:  ?Diagnosis Date  ? Arthritis   ? Benign essential hypertension 04/27/2019  ? Chronic kidney disease, stage 3a ([place]) 04/27/2019  ? Mixed hyperlipidemia 04/27/2019  ? Type 2 diabetes mellitus with diabetic polyneuropathy ([place]) 04/27/2019  ? Type 2 diabetes mellitus with stage 3 chronic kidney disease ([place]) 04/27/2019  ? ? ?Surgeries: Procedure(s): ?RIGHT TOTAL KNEE ARTHROPLASTY on 05/26/2021 ?  ?Consultants:  ? ?Discharged Condition: Improved ? ?Hospital Course: SHAMARCUS HOHEISEL is an 71 y.o. male who was admitted 05/26/2021 for operative treatment ofPrimary osteoarthritis of right knee. Patient has severe unremitting pain that affects sleep, daily activities, and work/hobbies. After pre-op clearance the patient was taken to the operating room on 05/26/2021 and underwent  Procedure(s): ?RIGHT TOTAL KNEE ARTHROPLASTY.   ? ?Patient was given perioperative antibiotics:  ?Anti-infectives (From admission, onward)  ? ? Start     Dose/Rate Route Frequency Ordered Stop  ? 05/26/21 1700  ceFAZolin (ANCEF) IVPB 2g/100 mL premix       ? 2 g ?200 mL/hr over 30 Minutes Intravenous Every 6 hours 05/26/21 1433 05/26/21 2312  ? 05/26/21 1226  vancomycin (VANCOCIN) powder  Status:  Discontinued       ?   As needed 05/26/21 1227 05/26/21 1314  ? 05/26/21 0816  ceFAZolin (ANCEF) 2-4 GM/100ML-% IVPB       ?Note to Pharmacy: Alba Cory B: cabinet override  ?    05/26/21 0816 05/26/21 1122  ? 05/26/21 0815  ceFAZolin (ANCEF) IVPB 2g/100 mL premix       ? 2 g ?200 mL/hr over 30 Minutes Intravenous On call to O.R. 05/26/21 0810 05/26/21 1148  ? ?  ?  ? ?Patient was given sequential compression devices, early ambulation, and  chemoprophylaxis to prevent DVT. ? ?Patient benefited maximally from hospital stay and there were no complications.   ? ?Recent vital signs: Patient Vitals for the past 24 hrs: ? BP Temp Temp src Pulse Resp SpO2  ?05/27/21 0342 119/80 97.6 ?F (36.4 ?C) Oral 69 18 96 %  ?05/27/21 0018 135/79 97.7 ?F (36.5 ?C) Oral 71 20 94 %  ?05/26/21 2019 (!) 159/84 97.8 ?F (36.6 ?C) Oral 97 18 96 %  ?05/26/21 1508 (!) 146/80 98 ?F (36.7 ?C) -- (!) 58 18 96 %  ?05/26/21 1447 136/77 97.7 ?F (36.5 ?C) -- 60 15 93 %  ?05/26/21 1432 133/65 -- -- (!) 59 17 93 %  ?05/26/21 1417 115/73 -- -- 60 16 92 %  ?05/26/21 1402 120/71 -- -- 60 16 93 %  ?05/26/21 1347 121/65 -- -- 60 20 92 %  ?05/26/21 1345 -- -- -- 61 14 91 %  ?05/26/21 1332 115/69 -- -- 64 16 93 %  ?05/26/21 1330 -- -- -- 66 14 91 %  ?05/26/21 1323 -- -- -- 75 10 91 %  ?05/26/21 1317 107/62 97.7 ?F (36.5 ?C) -- 66 11 92 %  ?05/26/21 0955 132/80 -- -- 68 12 95 %  ?05/26/21 0950 (!) 119/95 -- -- 63 16 95 %  ?05/26/21 0945 (!) 143/87 -- -- 70 15 96 %  ?05/26/21 0905 (!) 142/86 -- -- 60 10 95 %  ?  ? ?Recent laboratory studies:  ?Recent Labs  ?  05/27/21 ?0559  ?WBC 10.1  ?HGB 13.6  ?HCT 40.4  ?PLT 228  ? ? ? ?Discharge Medications:   ?Allergies as of 05/27/2021   ? ?   Reactions  ? Sulfamethoxazole Rash  ? ?  ? ?  ?Medication List  ?  ? ?STOP taking these medications   ? ?acetaminophen-codeine 300-30 MG tablet ?Commonly known as: TYLENOL #3 ?  ?Diclofenac Sodium CR 100 MG 24 hr tablet ?  ? ?  ? ?TAKE these medications   ? ?ALPRAZolam 0.5 MG tablet ?Commonly known as: Duanne Moron ?Take 1 tablet by mouth twice daily as needed ?  ?amLODipine-benazepril 10-20 MG capsule ?Commonly known as: LOTREL ?Take 1 capsule by mouth once daily ?  ?aspirin EC 81 MG tablet ?Take 1 tablet (81 mg total) by mouth 2 (two) times daily. To be taken after surgery to prevent blood clots ?  ?atorvastatin 40 MG tablet ?Commonly known as: LIPITOR ?Take 1 tablet (40 mg total) by mouth daily. ?  ?BD Pen Needle Micro U/F  32G X 6 MM Misc ?Generic drug: Insulin Pen Needle ?USE AS DIRECTED ONCE DAILY ?  ?cephALEXin 500 MG capsule ?Commonly known as: Keflex ?Take 1 capsule (500 mg total) by mouth 4 (four) times daily. To be taken after surgery ?  ?diclofenac Sodium 1 % Gel ?Commonly known as: VOLTAREN ?Apply 2 g topically daily as needed (pain). ?  ?docusate sodium 100 MG capsule ?Commonly known as: Colace ?Take 1 capsule (100 mg total) by mouth daily as needed. ?  ?Farxiga 10 MG Tabs tablet ?Generic drug: dapagliflozin propanediol ?Take 1 tablet by mouth once daily ?  ?fluticasone 50 MCG/ACT nasal spray ?Commonly known as: FLONASE ?Place 2 sprays into both nostrils daily as needed for allergies or rhinitis. ?  ?metFORMIN 1000 MG tablet ?Commonly known as: GLUCOPHAGE ?Take 1 tablet by mouth twice daily ?  ?methocarbamol 750 MG tablet ?Commonly known as: Robaxin-750 ?Take 1 tablet (750 mg total) by mouth 2 (two) times daily as needed for muscle spasms. ?  ?ondansetron 4 MG tablet ?Commonly known as: Zofran ?Take 1 tablet (4 mg total) by mouth every 8 (eight) hours as needed for nausea or vomiting. ?  ?ONE TOUCH ULTRA 2 w/Device Kit ?USE TO CHECK GLUCOSE THREE TIMES DAILY ?  ?oxyCODONE-acetaminophen 5-325 MG tablet ?Commonly known as: Percocet ?Take 1 tablet by mouth every 6 (six) hours as needed. To be taken after surgery ?  ?sildenafil 100 MG tablet ?Commonly known as: Viagra ?Take 1 tablet (100 mg total) by mouth daily as needed for erectile dysfunction. ?  ?tamsulosin 0.4 MG Caps capsule ?Commonly known as: FLOMAX ?Take 1 capsule (0.4 mg total) by mouth daily. ?  ?Tyler Aas FlexTouch 100 UNIT/ML FlexTouch Pen ?Generic drug: insulin degludec ?INJECT 30 UNITS SUBCUTANEOUSLY ONCE DAILY ?  ? ?  ? ?  ?  ? ? ?  ?Durable Medical Equipment  ?(From admission, onward)  ?  ? ? ?  ? ?  Start     Ordered  ? 05/26/21 1516  DME Walker rolling  Once       ?Question Answer Comment  ?Walker: With 5 Inch Wheels   ?Patient needs a walker to treat with the  following condition Status post left partial knee replacement   ?  ? 05/26/21 1515  ? 05/26/21 1516  DME 3 n 1  Once       ? 05/26/21 1515  ? 05/26/21 1516  DME Bedside commode  Once       ?  Question:  Patient needs a bedside commode to treat with the following condition  Answer:  Status post left partial knee replacement  ? 05/26/21 1515  ? ?  ?  ? ?  ? ? ?Diagnostic Studies: DG Knee Right Port ? ?Result Date: 05/26/2021 ?CLINICAL DATA:  Status post right knee arthroplasty EXAM: PORTABLE RIGHT KNEE - 1-2 VIEW COMPARISON:  03/24/2021 FINDINGS: There is interval right knee arthroplasty. There are pockets of air in the soft tissues. Arterial calcifications are seen in the soft tissues. IMPRESSION: Status post right knee arthroplasty. Electronically Signed   By: Elmer Picker M.D.   On: 05/26/2021 14:27   ? ?Disposition: Discharge disposition: 01-Home or Self Care ? ? ? ? ? ? ? ? ? Follow-up Information   ? ? [facility] Follow up.   ?Specialty: Home Health Services ?Contact information: ?[address] ?[address] ?[place] [address] ?[phone] ? ? ?  ?  ? ? Leandrew Koyanagi, MD. Schedule an appointment as soon as possible for a visit in 2 week(s).   ?Specialty: Orthopedic Surgery ?Contact information: ?[address] ?[place] [address] ?[phone] ? ? ?  ?  ? ?  ?  ? ?  ? ? ? ?Signed: ?Aundra Dubin ?05/27/2021, 8:08 AM ? ? ? ? ?

## 2021-05-27 NOTE — Anesthesia Postprocedure Evaluation (Signed)
Anesthesia Post Note ? ?Patient: Clayton James ? ?Procedure(s) Performed: RIGHT TOTAL KNEE ARTHROPLASTY (Right: Knee) ? ?  ? ?Patient location during evaluation: PACU ?Anesthesia Type: Spinal ?Level of consciousness: oriented and awake and alert ?Pain management: pain level controlled ?Vital Signs Assessment: post-procedure vital signs reviewed and stable ?Respiratory status: spontaneous breathing, respiratory function stable and patient connected to nasal cannula oxygen ?Cardiovascular status: blood pressure returned to baseline and stable ?Postop Assessment: no headache, no backache and no apparent nausea or vomiting ?Anesthetic complications: no ? ? ?No notable events documented. ?  ?  ?  ?  ?  ?  ? ?Effie Berkshire ? ? ? ? ?

## 2021-05-27 NOTE — Evaluation (Signed)
Occupational Therapy Evaluation ?Patient Details ?Name: Clayton James ?MRN: [id number] ?DOB: 12-02-1950 ?Today's Date: 05/27/2021 ? ? ?History of Present Illness Pt is a 71 y.o. M who presents s/p R TKA 05/26/2021. Significant PMH: DM2, CKD 3a.  ? ?Clinical Impression ?  ?Pt reports independence at baseline with ADLs and functional mobility, lives with spouse who can provide assistance at home. Pt currently min guard -minA for ADLs, and min guard for transfers with RW. Pt educated on compensatory strategies for dressing along with use of BSC as shower seat for bathing. Pt verbalizes understanding. Pt presenting with impairments listed below, will follow acutely.  ?   ? ?Recommendations for follow up therapy are one component of a multi-disciplinary discharge planning process, led by the attending physician.  Recommendations may be updated based on patient status, additional functional criteria and insurance authorization.  ? ?Follow Up Recommendations ? Follow physician's recommendations for discharge plan and follow up therapies  ?  ?Assistance Recommended at Discharge Set up Supervision/Assistance  ?Patient can return home with the following A little help with walking and/or transfers;A little help with bathing/dressing/bathroom;Assistance with cooking/housework;Assist for transportation;Help with stairs or ramp for entrance ? ?  ?Functional Status Assessment ? Patient has had a recent decline in their functional status and demonstrates the ability to make significant improvements in function in a reasonable and predictable amount of time.  ?Equipment Recommendations ? None recommended by OT;Other (comment) (pt has all needed DME)  ?  ?Recommendations for Other Services   ? ? ?  ?Precautions / Restrictions Precautions ?Precautions: Fall ?Restrictions ?Weight Bearing Restrictions: No  ? ?  ? ?Mobility Bed Mobility ?  ?  ?  ?  ?  ?  ?  ?General bed mobility comments: up in chair upon arrival ?  ? ?Transfers ?Overall  transfer level: Needs assistance ?Equipment used: Rolling walker (2 wheels) ?Transfers: Sit to/from Stand ?Sit to Stand: Min guard ?  ?  ?  ?  ?  ?  ?  ? ?  ?Balance Overall balance assessment: Needs assistance ?Sitting-balance support: Feet supported ?Sitting balance-Leahy Scale: Good ?  ?  ?Standing balance support: Bilateral upper extremity supported ?Standing balance-Leahy Scale: Fair ?Standing balance comment: stands without RW support for standing grooming task, can let go of RW with 1 hand without LOB ?  ?  ?  ?  ?  ?  ?  ?  ?  ?  ?  ?   ? ?ADL either performed or assessed with clinical judgement  ? ?ADL Overall ADL's : Needs assistance/impaired ?Eating/Feeding: Set up;Sitting ?  ?Grooming: Set up;Sitting;Standing ?Grooming Details (indicate cue type and reason): brushes teeth standing at sink ?Upper Body Bathing: Supervision/ safety;Standing;Sitting ?  ?Lower Body Bathing: Sitting/lateral leans;Sit to/from stand;Minimal assistance ?  ?Upper Body Dressing : Supervision/safety;Standing;Sitting ?  ?Lower Body Dressing: Sitting/lateral leans;Sit to/from stand;Minimal assistance ?  ?Toilet Transfer: Min guard;Rolling walker (2 wheels);Ambulation;Regular Toilet ?  ?Toileting- Clothing Manipulation and Hygiene: Min guard;Sitting/lateral lean;Sit to/from stand ?  ?  ?  ?Functional mobility during ADLs: Min guard;Rolling walker (2 wheels) ?   ? ? ? ?Vision   ?Vision Assessment?: No apparent visual deficits  ?   ?Perception   ?  ?Praxis   ?  ? ?Pertinent Vitals/Pain Pain Assessment ?Pain Assessment: Faces ?Pain Score: 4  ?Faces Pain Scale: Hurts little more ?Pain Location: R knee ?Pain Descriptors / Indicators: Tightness ?Pain Intervention(s): Limited activity within patient's tolerance, Monitored during session, Repositioned, Ice applied  ? ? ? ?  Hand Dominance   ?  ?Extremity/Trunk Assessment Upper Extremity Assessment ?Upper Extremity Assessment: Overall WFL for tasks assessed ?  ?Lower Extremity Assessment ?Lower  Extremity Assessment: Defer to PT evaluation ?  ?Cervical / Trunk Assessment ?Cervical / Trunk Assessment: Normal ?  ?Communication Communication ?Communication: HOH ?  ?Cognition Arousal/Alertness: Awake/alert ?Behavior During Therapy: [facility] for tasks assessed/performed ?Overall Cognitive Status: Within Functional Limits for tasks assessed ?  ?  ?  ?  ?  ?  ?  ?  ?  ?  ?  ?  ?  ?  ?  ?  ?  ?  ?  ?General Comments  VSS on RA ? ?  ?Exercises   ?  ?Shoulder Instructions    ? ? ?Home Living Family/patient expects to be discharged to:: Private residence ?Living Arrangements: Spouse/significant other ?Available Help at Discharge: Family ?Type of Home: House ?Home Access: Stairs to enter ?Entrance Stairs-Number of Steps: 3 ?Entrance Stairs-Rails: Right;Left ?Home Layout: One level ?  ?  ?Bathroom Shower/Tub: Tub/shower unit ?  ?  ?  ?  ?Home Equipment: Rolling Walker (2 wheels);BSC/3in1 ?  ?Additional Comments: Has dog - pit mix ?  ? ?  ?Prior Functioning/Environment Prior Level of Function : Independent/Modified Independent ?  ?  ?  ?  ?  ?  ?Mobility Comments: no AD use ?ADLs Comments: enjoys working in his garage/being outdoors ?  ? ?  ?  ?OT Problem List: Decreased strength;Decreased range of motion;Decreased activity tolerance;Impaired balance (sitting and/or standing) ?  ?   ?OT Treatment/Interventions: Self-care/ADL training;Therapeutic exercise;DME and/or AE instruction;Therapeutic activities;Patient/family education;Balance training  ?  ?OT Goals(Current goals can be found in the care plan section) Acute Rehab OT Goals ?Patient Stated Goal: to go home ?OT Goal Formulation: With patient ?Time For Goal Achievement: 06/10/21 ?Potential to Achieve Goals: Good  ?OT Frequency: Min 3X/week ?  ? ?Co-evaluation   ?  ?  ?  ?  ? ?  ?AM-PAC OT "6 Clicks" Daily Activity     ?Outcome Measure Help from another person eating meals?: None ?Help from another person taking care of personal grooming?: None ?Help from another person  toileting, which includes using toliet, bedpan, or urinal?: None ?Help from another person bathing (including washing, rinsing, drying)?: A Little ?Help from another person to put on and taking off regular upper body clothing?: None ?Help from another person to put on and taking off regular lower body clothing?: A Little ?6 Click Score: 22 ?  ?End of Session Equipment Utilized During Treatment: Gait belt;Rolling walker (2 wheels) ?Nurse Communication: Mobility status ? ?Activity Tolerance: Patient tolerated treatment well ?Patient left: in chair;with call bell/phone within reach ? ?OT Visit Diagnosis: Unsteadiness on feet (R26.81);Other abnormalities of gait and mobility (R26.89);Muscle weakness (generalized) (M62.81)  ?              ?Time: 1610-9604 ?OT Time Calculation (min): 27 min ?Charges:  OT General Charges ?$OT Visit: 1 Visit ?OT Evaluation ?$OT Eval Low Complexity: 1 Low ?OT Treatments ?$Self Care/Home Management : 8-22 mins ? ?Lynnda Child, OTD, OTR/L ?Acute Rehab ?[phone] ? ?Kaylyn Lim ?05/27/2021, 9:39 AM ?

## 2021-05-27 NOTE — Progress Notes (Signed)
Subjective: ?1 Day Post-Op Procedure(s) (LRB): ?RIGHT TOTAL KNEE ARTHROPLASTY (Right) ?Patient reports pain as mild.  Feeling great ? ?Objective: ?Vital signs in last 24 hours: ?Temp:  [97.6 ?F (36.4 ?C)-98.3 ?F (36.8 ?C)] 97.6 ?F (36.4 ?C) (05/09 0342) ?Pulse Rate:  [58-97] 69 (05/09 0342) ?Resp:  [10-20] 18 (05/09 0342) ?BP: (107-159)/(62-95) 119/80 (05/09 0342) ?SpO2:  [91 %-98 %] 96 % (05/09 0342) ?Weight:  [86.2 kg] 86.2 kg (05/08 0807) ? ?Intake/Output from previous day: ?05/08 0701 - 05/09 0700 ?In: 900 [I.V.:800; IV Piggyback:100] ?Out: 2300 [Urine:2275; Blood:25] ?Intake/Output this shift: ?No intake/output data recorded. ? ?Recent Labs  ?  05/27/21 ?0559  ?HGB 13.6  ? ?Recent Labs  ?  05/27/21 ?0559  ?WBC 10.1  ?RBC 4.29  ?HCT 40.4  ?PLT 228  ? ?No results for input(s): NA, K, CL, CO2, BUN, CREATININE, GLUCOSE, CALCIUM in the last 72 hours. ?No results for input(s): LABPT, INR in the last 72 hours. ? ?Neurologically intact ?Neurovascular intact ?Sensation intact distally ?Intact pulses distally ?Dorsiflexion/Plantar flexion intact ?Incision: moderate drainage ?No cellulitis present ?Compartment soft ? ? ?Assessment/Plan: ?1 Day Post-Op Procedure(s) (LRB): ?RIGHT TOTAL KNEE ARTHROPLASTY (Right) ?Advance diet ?Up with therapy ?D/C IV fluids ?Discharge home with home health after first or second PT session (depending on progression) ?WBAT RLE ?Bandage changed by me this am ? ? ? ?Anticipated LOS equal to or greater than 2 midnights due to ?- Age 71 and older with one or more of the following: ? - Obesity ? - Expected need for hospital services (PT, OT, Nursing) required for safe  discharge ? - Anticipated need for postoperative skilled nursing care or inpatient rehab ? - Active co-morbidities: Diabetes ?OR  ? ?- Unanticipated findings during/Post Surgery: None  ?- Patient is a high risk of re-admission due to: None ? ? ?Aundra Dubin ?05/27/2021, 8:06 AM ? ?

## 2021-05-28 DIAGNOSIS — D229 Melanocytic nevi, unspecified: Secondary | ICD-10-CM | POA: Diagnosis not present

## 2021-05-28 DIAGNOSIS — Z87891 Personal history of nicotine dependence: Secondary | ICD-10-CM | POA: Diagnosis not present

## 2021-05-28 DIAGNOSIS — Z7982 Long term (current) use of aspirin: Secondary | ICD-10-CM | POA: Diagnosis not present

## 2021-05-28 DIAGNOSIS — Z7951 Long term (current) use of inhaled steroids: Secondary | ICD-10-CM | POA: Diagnosis not present

## 2021-05-28 DIAGNOSIS — N1831 Chronic kidney disease, stage 3a: Secondary | ICD-10-CM | POA: Diagnosis not present

## 2021-05-28 DIAGNOSIS — Z7984 Long term (current) use of oral hypoglycemic drugs: Secondary | ICD-10-CM | POA: Diagnosis not present

## 2021-05-28 DIAGNOSIS — E1142 Type 2 diabetes mellitus with diabetic polyneuropathy: Secondary | ICD-10-CM | POA: Diagnosis not present

## 2021-05-28 DIAGNOSIS — I10 Essential (primary) hypertension: Secondary | ICD-10-CM | POA: Diagnosis not present

## 2021-05-28 DIAGNOSIS — Z471 Aftercare following joint replacement surgery: Secondary | ICD-10-CM | POA: Diagnosis not present

## 2021-05-28 DIAGNOSIS — E782 Mixed hyperlipidemia: Secondary | ICD-10-CM | POA: Diagnosis not present

## 2021-05-28 DIAGNOSIS — N400 Enlarged prostate without lower urinary tract symptoms: Secondary | ICD-10-CM | POA: Diagnosis not present

## 2021-05-28 DIAGNOSIS — Z794 Long term (current) use of insulin: Secondary | ICD-10-CM | POA: Diagnosis not present

## 2021-05-28 DIAGNOSIS — E1151 Type 2 diabetes mellitus with diabetic peripheral angiopathy without gangrene: Secondary | ICD-10-CM | POA: Diagnosis not present

## 2021-05-28 DIAGNOSIS — E1122 Type 2 diabetes mellitus with diabetic chronic kidney disease: Secondary | ICD-10-CM | POA: Diagnosis not present

## 2021-05-28 DIAGNOSIS — Z96651 Presence of right artificial knee joint: Secondary | ICD-10-CM | POA: Diagnosis not present

## 2021-05-28 DIAGNOSIS — M1712 Unilateral primary osteoarthritis, left knee: Secondary | ICD-10-CM | POA: Diagnosis not present

## 2021-05-29 DIAGNOSIS — E782 Mixed hyperlipidemia: Secondary | ICD-10-CM | POA: Diagnosis not present

## 2021-05-29 DIAGNOSIS — Z7984 Long term (current) use of oral hypoglycemic drugs: Secondary | ICD-10-CM | POA: Diagnosis not present

## 2021-05-29 DIAGNOSIS — N1831 Chronic kidney disease, stage 3a: Secondary | ICD-10-CM | POA: Diagnosis not present

## 2021-05-29 DIAGNOSIS — Z7951 Long term (current) use of inhaled steroids: Secondary | ICD-10-CM | POA: Diagnosis not present

## 2021-05-29 DIAGNOSIS — I10 Essential (primary) hypertension: Secondary | ICD-10-CM | POA: Diagnosis not present

## 2021-05-29 DIAGNOSIS — Z7982 Long term (current) use of aspirin: Secondary | ICD-10-CM | POA: Diagnosis not present

## 2021-05-29 DIAGNOSIS — E1151 Type 2 diabetes mellitus with diabetic peripheral angiopathy without gangrene: Secondary | ICD-10-CM | POA: Diagnosis not present

## 2021-05-29 DIAGNOSIS — E1142 Type 2 diabetes mellitus with diabetic polyneuropathy: Secondary | ICD-10-CM | POA: Diagnosis not present

## 2021-05-29 DIAGNOSIS — Z96651 Presence of right artificial knee joint: Secondary | ICD-10-CM | POA: Diagnosis not present

## 2021-05-29 DIAGNOSIS — M1712 Unilateral primary osteoarthritis, left knee: Secondary | ICD-10-CM | POA: Diagnosis not present

## 2021-05-29 DIAGNOSIS — Z471 Aftercare following joint replacement surgery: Secondary | ICD-10-CM | POA: Diagnosis not present

## 2021-05-29 DIAGNOSIS — D229 Melanocytic nevi, unspecified: Secondary | ICD-10-CM | POA: Diagnosis not present

## 2021-05-29 DIAGNOSIS — N400 Enlarged prostate without lower urinary tract symptoms: Secondary | ICD-10-CM | POA: Diagnosis not present

## 2021-05-29 DIAGNOSIS — Z794 Long term (current) use of insulin: Secondary | ICD-10-CM | POA: Diagnosis not present

## 2021-05-29 DIAGNOSIS — E1122 Type 2 diabetes mellitus with diabetic chronic kidney disease: Secondary | ICD-10-CM | POA: Diagnosis not present

## 2021-05-29 DIAGNOSIS — Z87891 Personal history of nicotine dependence: Secondary | ICD-10-CM | POA: Diagnosis not present

## 2021-05-30 DIAGNOSIS — D229 Melanocytic nevi, unspecified: Secondary | ICD-10-CM | POA: Diagnosis not present

## 2021-05-30 DIAGNOSIS — Z7951 Long term (current) use of inhaled steroids: Secondary | ICD-10-CM | POA: Diagnosis not present

## 2021-05-30 DIAGNOSIS — E782 Mixed hyperlipidemia: Secondary | ICD-10-CM | POA: Diagnosis not present

## 2021-05-30 DIAGNOSIS — M1712 Unilateral primary osteoarthritis, left knee: Secondary | ICD-10-CM | POA: Diagnosis not present

## 2021-05-30 DIAGNOSIS — I10 Essential (primary) hypertension: Secondary | ICD-10-CM | POA: Diagnosis not present

## 2021-05-30 DIAGNOSIS — N1831 Chronic kidney disease, stage 3a: Secondary | ICD-10-CM | POA: Diagnosis not present

## 2021-05-30 DIAGNOSIS — E1142 Type 2 diabetes mellitus with diabetic polyneuropathy: Secondary | ICD-10-CM | POA: Diagnosis not present

## 2021-05-30 DIAGNOSIS — Z87891 Personal history of nicotine dependence: Secondary | ICD-10-CM | POA: Diagnosis not present

## 2021-05-30 DIAGNOSIS — M1711 Unilateral primary osteoarthritis, right knee: Secondary | ICD-10-CM | POA: Diagnosis not present

## 2021-05-30 DIAGNOSIS — Z96651 Presence of right artificial knee joint: Secondary | ICD-10-CM | POA: Diagnosis not present

## 2021-05-30 DIAGNOSIS — Z7982 Long term (current) use of aspirin: Secondary | ICD-10-CM | POA: Diagnosis not present

## 2021-05-30 DIAGNOSIS — Z7984 Long term (current) use of oral hypoglycemic drugs: Secondary | ICD-10-CM | POA: Diagnosis not present

## 2021-05-30 DIAGNOSIS — E1151 Type 2 diabetes mellitus with diabetic peripheral angiopathy without gangrene: Secondary | ICD-10-CM | POA: Diagnosis not present

## 2021-05-30 DIAGNOSIS — Z794 Long term (current) use of insulin: Secondary | ICD-10-CM | POA: Diagnosis not present

## 2021-05-30 DIAGNOSIS — Z471 Aftercare following joint replacement surgery: Secondary | ICD-10-CM | POA: Diagnosis not present

## 2021-05-30 DIAGNOSIS — E1122 Type 2 diabetes mellitus with diabetic chronic kidney disease: Secondary | ICD-10-CM | POA: Diagnosis not present

## 2021-05-30 DIAGNOSIS — N400 Enlarged prostate without lower urinary tract symptoms: Secondary | ICD-10-CM | POA: Diagnosis not present

## 2021-06-01 DIAGNOSIS — Z87891 Personal history of nicotine dependence: Secondary | ICD-10-CM | POA: Diagnosis not present

## 2021-06-01 DIAGNOSIS — E1142 Type 2 diabetes mellitus with diabetic polyneuropathy: Secondary | ICD-10-CM | POA: Diagnosis not present

## 2021-06-01 DIAGNOSIS — Z96651 Presence of right artificial knee joint: Secondary | ICD-10-CM | POA: Diagnosis not present

## 2021-06-01 DIAGNOSIS — Z794 Long term (current) use of insulin: Secondary | ICD-10-CM | POA: Diagnosis not present

## 2021-06-01 DIAGNOSIS — N400 Enlarged prostate without lower urinary tract symptoms: Secondary | ICD-10-CM | POA: Diagnosis not present

## 2021-06-01 DIAGNOSIS — Z7951 Long term (current) use of inhaled steroids: Secondary | ICD-10-CM | POA: Diagnosis not present

## 2021-06-01 DIAGNOSIS — Z7982 Long term (current) use of aspirin: Secondary | ICD-10-CM | POA: Diagnosis not present

## 2021-06-01 DIAGNOSIS — E1122 Type 2 diabetes mellitus with diabetic chronic kidney disease: Secondary | ICD-10-CM | POA: Diagnosis not present

## 2021-06-01 DIAGNOSIS — E1151 Type 2 diabetes mellitus with diabetic peripheral angiopathy without gangrene: Secondary | ICD-10-CM | POA: Diagnosis not present

## 2021-06-01 DIAGNOSIS — M1712 Unilateral primary osteoarthritis, left knee: Secondary | ICD-10-CM | POA: Diagnosis not present

## 2021-06-01 DIAGNOSIS — N1831 Chronic kidney disease, stage 3a: Secondary | ICD-10-CM | POA: Diagnosis not present

## 2021-06-01 DIAGNOSIS — E782 Mixed hyperlipidemia: Secondary | ICD-10-CM | POA: Diagnosis not present

## 2021-06-01 DIAGNOSIS — I10 Essential (primary) hypertension: Secondary | ICD-10-CM | POA: Diagnosis not present

## 2021-06-01 DIAGNOSIS — Z471 Aftercare following joint replacement surgery: Secondary | ICD-10-CM | POA: Diagnosis not present

## 2021-06-01 DIAGNOSIS — Z7984 Long term (current) use of oral hypoglycemic drugs: Secondary | ICD-10-CM | POA: Diagnosis not present

## 2021-06-01 DIAGNOSIS — D229 Melanocytic nevi, unspecified: Secondary | ICD-10-CM | POA: Diagnosis not present

## 2021-06-05 ENCOUNTER — Telehealth: Payer: Self-pay

## 2021-06-05 ENCOUNTER — Other Ambulatory Visit: Payer: Self-pay | Admitting: Family Medicine

## 2021-06-05 MED ORDER — OXYCODONE-ACETAMINOPHEN 5-325 MG PO TABS: 1.0000 | ORAL_TABLET | Freq: Two times a day (BID) | ORAL | 0 refills | Status: DC | PRN

## 2021-06-05 NOTE — Chronic Care Management (AMB) (Signed)
Chronic Care Management Pharmacy Assistant   Name: Clayton James  MRN: [id number] DOB: 02-Jan-1951   Reason for Encounter: Disease State/ Diabetes  Recent office visits:  04-03-2021 Lillard Anes, MD. Albumin/Globulin= 2.3. A1C= 7.3. LDL= 117  Recent consult visits:  05-26-2021 Leandrew Koyanagi, MD (Neurosurgery). Right total knee arthroplasty procedure completed.   05-20-2021 Pre-op testing  Hospital visits:  None in previous 6 months  Medications: Outpatient Encounter Medications as of 06/05/2021  Medication Sig   aspirin EC 81 MG tablet Take 1 tablet (81 mg total) by mouth 2 (two) times daily. To be taken after surgery to prevent blood clots   docusate sodium (COLACE) 100 MG capsule Take 1 capsule (100 mg total) by mouth daily as needed.   methocarbamol (ROBAXIN-750) 750 MG tablet Take 1 tablet (750 mg total) by mouth 2 (two) times daily as needed for muscle spasms.   ondansetron (ZOFRAN) 4 MG tablet Take 1 tablet (4 mg total) by mouth every 8 (eight) hours as needed for nausea or vomiting.   oxyCODONE-acetaminophen (PERCOCET) 5-325 MG tablet Take 1 tablet by mouth every 6 (six) hours as needed. To be taken after surgery   ALPRAZolam (XANAX) 0.5 MG tablet Take 1 tablet by mouth twice daily as needed   amLODipine-benazepril (LOTREL) 10-20 MG capsule Take 1 capsule by mouth once daily   atorvastatin (LIPITOR) 40 MG tablet Take 1 tablet (40 mg total) by mouth daily.   Blood Glucose Monitoring Suppl (ONE TOUCH ULTRA 2) w/Device KIT USE TO CHECK GLUCOSE THREE TIMES DAILY   cephALEXin (KEFLEX) 500 MG capsule Take 1 capsule (500 mg total) by mouth 4 (four) times daily. To be taken after surgery   diclofenac Sodium (VOLTAREN) 1 % GEL Apply 2 g topically daily as needed (pain).   FARXIGA 10 MG TABS tablet Take 1 tablet by mouth once daily   fluticasone (FLONASE) 50 MCG/ACT nasal spray Place 2 sprays into both nostrils daily as needed for allergies or rhinitis.   Insulin Pen  Needle (BD PEN NEEDLE MICRO U/F) 32G X 6 MM MISC USE AS DIRECTED ONCE DAILY   metFORMIN (GLUCOPHAGE) 1000 MG tablet Take 1 tablet by mouth twice daily   sildenafil (VIAGRA) 100 MG tablet Take 1 tablet (100 mg total) by mouth daily as needed for erectile dysfunction.   tamsulosin (FLOMAX) 0.4 MG CAPS capsule Take 1 capsule (0.4 mg total) by mouth daily.   TRESIBA FLEXTOUCH 100 UNIT/ML FlexTouch Pen INJECT 30 UNITS SUBCUTANEOUSLY ONCE DAILY   No facility-administered encounter medications on file as of 06/05/2021.  Recent Relevant Labs: Lab Results  Component Value Date/Time   HGBA1C 7.3 (H) 04/03/2021 08:23 AM   HGBA1C 6.3 (H) 12/02/2020 11:17 AM   MICROALBUR 80 05/17/2019 09:03 AM    Kidney Function Lab Results  Component Value Date/Time   CREATININE 0.85 05/20/2021 11:21 AM   CREATININE 0.89 04/03/2021 08:23 AM   GFRNONAA >60 05/20/2021 11:21 AM   GFRAA 109 02/15/2020 10:01 AM     Current antihyperglycemic regimen:  Farxiga 10 mg daily Tresiba 30 units daily Metformin 1000 mg twice daily   Patient verbally confirms he is taking the above medications as directed. Yes  What recent interventions/DTPs have been made to improve glycemic control:  Educated on A1c and blood sugar goals; Extensive time was spent counseling patient on the pathophysiology and risk for complications with uncontrolled Diabetes.  Used teach back method for counseling on ADA diabetic diet.  Time spent on explaining role  of carbohydrates versus sugars impacting blood glucose levels and meal planning. Current exercise consists of walking 10 minutes three times a week, plan is to increase to 20 minutes 5 days a week and then after 2-3 weeks increase to 30 minute 5 days a week.  -Counseled to check feet daily and get yearly eye exams  Have there been any recent hospitalizations or ED visits since last visit with CPP? No  Patient denies hypoglycemic symptoms  Patient denies hyperglycemic symptoms  How often are  you checking your blood sugar? once daily  What are your blood sugars ranging?  Fasting: 130, 129, 128 Before meals: None After meals: None Bedtime: None  On insulin? Yes How many units: 30  During the week, how often does your blood glucose drop below 70? Never  Are you checking your feet daily/regularly? Yes  Adherence Review: Is the patient currently on a STATIN medication? Yes Is the patient currently on ACE/ARB medication? Yes Does the patient have >5 day gap between last estimated fill dates? No  Care Gaps: Last eye exam / Retinopathy Screening? 07-01-2020 Last Annual Wellness Visit? 04-24-2020 Last Diabetic Foot Exam? Patient states Dr. Henrene Pastor examines feet.  Star Rating Drugs: Farxiga 10 mg- Patient assistance Metformin 1000 mg- Last filled 05-27-2021 90 DS Atorvastatin 40 mg- Last filled 03-27-2021 90 DS Amlodipine-benazepril 10-20 mg- Last filled 04-17-2021 90 DS   [facility] Clinical Pharmacist Assistant [phone]

## 2021-06-05 NOTE — Telephone Encounter (Signed)
Spoke with patient, verbalized understanding and had no questions at this time.  

## 2021-06-05 NOTE — Telephone Encounter (Signed)
Patient had knee surgery on May 8th, he was given a prescription for Oxycodone that was filled on 05/19/21 as noted in the chart. Patient states he is out of pain medication and he called his Ortho Psychologist, sport and exercise and they stated that he would have to call us to get more pain medication. Please advise.

## 2021-06-06 DIAGNOSIS — Z87891 Personal history of nicotine dependence: Secondary | ICD-10-CM | POA: Diagnosis not present

## 2021-06-06 DIAGNOSIS — Z7982 Long term (current) use of aspirin: Secondary | ICD-10-CM | POA: Diagnosis not present

## 2021-06-06 DIAGNOSIS — D229 Melanocytic nevi, unspecified: Secondary | ICD-10-CM | POA: Diagnosis not present

## 2021-06-06 DIAGNOSIS — N1831 Chronic kidney disease, stage 3a: Secondary | ICD-10-CM | POA: Diagnosis not present

## 2021-06-06 DIAGNOSIS — E1142 Type 2 diabetes mellitus with diabetic polyneuropathy: Secondary | ICD-10-CM | POA: Diagnosis not present

## 2021-06-06 DIAGNOSIS — E1151 Type 2 diabetes mellitus with diabetic peripheral angiopathy without gangrene: Secondary | ICD-10-CM | POA: Diagnosis not present

## 2021-06-06 DIAGNOSIS — E782 Mixed hyperlipidemia: Secondary | ICD-10-CM | POA: Diagnosis not present

## 2021-06-06 DIAGNOSIS — Z7951 Long term (current) use of inhaled steroids: Secondary | ICD-10-CM | POA: Diagnosis not present

## 2021-06-06 DIAGNOSIS — Z471 Aftercare following joint replacement surgery: Secondary | ICD-10-CM | POA: Diagnosis not present

## 2021-06-06 DIAGNOSIS — E1122 Type 2 diabetes mellitus with diabetic chronic kidney disease: Secondary | ICD-10-CM | POA: Diagnosis not present

## 2021-06-06 DIAGNOSIS — Z96651 Presence of right artificial knee joint: Secondary | ICD-10-CM | POA: Diagnosis not present

## 2021-06-06 DIAGNOSIS — N400 Enlarged prostate without lower urinary tract symptoms: Secondary | ICD-10-CM | POA: Diagnosis not present

## 2021-06-06 DIAGNOSIS — M1712 Unilateral primary osteoarthritis, left knee: Secondary | ICD-10-CM | POA: Diagnosis not present

## 2021-06-06 DIAGNOSIS — Z7984 Long term (current) use of oral hypoglycemic drugs: Secondary | ICD-10-CM | POA: Diagnosis not present

## 2021-06-06 DIAGNOSIS — Z794 Long term (current) use of insulin: Secondary | ICD-10-CM | POA: Diagnosis not present

## 2021-06-06 DIAGNOSIS — I10 Essential (primary) hypertension: Secondary | ICD-10-CM | POA: Diagnosis not present

## 2021-06-09 DIAGNOSIS — Z96651 Presence of right artificial knee joint: Secondary | ICD-10-CM | POA: Diagnosis not present

## 2021-06-09 DIAGNOSIS — Z7951 Long term (current) use of inhaled steroids: Secondary | ICD-10-CM | POA: Diagnosis not present

## 2021-06-09 DIAGNOSIS — Z794 Long term (current) use of insulin: Secondary | ICD-10-CM | POA: Diagnosis not present

## 2021-06-09 DIAGNOSIS — D229 Melanocytic nevi, unspecified: Secondary | ICD-10-CM | POA: Diagnosis not present

## 2021-06-09 DIAGNOSIS — E1151 Type 2 diabetes mellitus with diabetic peripheral angiopathy without gangrene: Secondary | ICD-10-CM | POA: Diagnosis not present

## 2021-06-09 DIAGNOSIS — N1831 Chronic kidney disease, stage 3a: Secondary | ICD-10-CM | POA: Diagnosis not present

## 2021-06-09 DIAGNOSIS — E1122 Type 2 diabetes mellitus with diabetic chronic kidney disease: Secondary | ICD-10-CM | POA: Diagnosis not present

## 2021-06-09 DIAGNOSIS — Z7984 Long term (current) use of oral hypoglycemic drugs: Secondary | ICD-10-CM | POA: Diagnosis not present

## 2021-06-09 DIAGNOSIS — N400 Enlarged prostate without lower urinary tract symptoms: Secondary | ICD-10-CM | POA: Diagnosis not present

## 2021-06-09 DIAGNOSIS — E1142 Type 2 diabetes mellitus with diabetic polyneuropathy: Secondary | ICD-10-CM | POA: Diagnosis not present

## 2021-06-09 DIAGNOSIS — Z87891 Personal history of nicotine dependence: Secondary | ICD-10-CM | POA: Diagnosis not present

## 2021-06-09 DIAGNOSIS — Z471 Aftercare following joint replacement surgery: Secondary | ICD-10-CM | POA: Diagnosis not present

## 2021-06-09 DIAGNOSIS — I10 Essential (primary) hypertension: Secondary | ICD-10-CM | POA: Diagnosis not present

## 2021-06-09 DIAGNOSIS — E782 Mixed hyperlipidemia: Secondary | ICD-10-CM | POA: Diagnosis not present

## 2021-06-09 DIAGNOSIS — M1712 Unilateral primary osteoarthritis, left knee: Secondary | ICD-10-CM | POA: Diagnosis not present

## 2021-06-09 DIAGNOSIS — Z7982 Long term (current) use of aspirin: Secondary | ICD-10-CM | POA: Diagnosis not present

## 2021-06-10 ENCOUNTER — Other Ambulatory Visit: Payer: Self-pay | Admitting: Physician Assistant

## 2021-06-10 ENCOUNTER — Telehealth: Payer: Self-pay | Admitting: Physician Assistant

## 2021-06-10 ENCOUNTER — Encounter: Payer: Self-pay | Admitting: Orthopaedic Surgery

## 2021-06-10 ENCOUNTER — Ambulatory Visit (INDEPENDENT_AMBULATORY_CARE_PROVIDER_SITE_OTHER): Payer: Medicare HMO | Admitting: Physician Assistant

## 2021-06-10 DIAGNOSIS — Z96651 Presence of right artificial knee joint: Secondary | ICD-10-CM

## 2021-06-10 MED ORDER — OXYCODONE-ACETAMINOPHEN 5-325 MG PO TABS: 1.0000 | ORAL_TABLET | Freq: Two times a day (BID) | ORAL | 0 refills | Status: DC | PRN

## 2021-06-10 NOTE — Telephone Encounter (Signed)
Andris Flurry called from [employer]. She says another RX for oxycodone was called in on 5/18 by another doctor. Does the refill today need to be filled? Her call back number is [phone]

## 2021-06-10 NOTE — Telephone Encounter (Signed)
yes

## 2021-06-10 NOTE — Progress Notes (Signed)
Post-Op Visit Note   Patient: Clayton James           Date of Birth: [birth year]           MRN: [id number] Visit Date: 06/10/2021 PCP: Lillard Anes, MD   Assessment & Plan:  Chief Complaint:  Chief Complaint  Patient presents with   Right Knee - Follow-up    Right total knee arthroplasty 05/26/2021   Visit Diagnoses:  1. Hx of total knee replacement, right     Plan: Patient is a pleasant 71 year old gentleman who comes in today 2-week status post right total knee replacement 05/26/2021.  He has been doing well.  Minimal pain.  He is ambulating with a cane and getting home health physical therapy.  He has been taking oxycodone at night.  He has been compliant taking a baby aspirin twice daily for DVT prophylaxis.  Examination of the right knee reveals a fully healed surgical incision with nylon sutures in place.  No evidence of infection or cellulitis.  Calf is soft nontender.  He is neurovascular intact distally.  Today, sutures were removed and Steri-Strips applied.  He will continue taking his baby aspirin twice daily for another 4 weeks for DVT prophylaxis.  I refilled his Percocet.  I have placed an external referral for outpatient physical therapy for [place] reabsorb.  He will follow-up with [place] in 4 weeks time for repeat evaluation and x-rays of the right knee.  Call with concerns or questions in meantime.  Follow-Up Instructions: Return in about 4 weeks (around 07/08/2021).   Orders:  Orders Placed This Encounter  Procedures   Ambulatory referral to Physical Therapy   Meds ordered this encounter  Medications   oxyCODONE-acetaminophen (PERCOCET) 5-325 MG tablet    Sig: Take 1-2 tablets by mouth 2 (two) times daily as needed.    Dispense:  30 tablet    Refill:  0    Imaging: No new imaging.  PMFS History: Patient Active Problem List   Diagnosis Date Noted   Primary osteoarthritis of right knee 05/26/2021   Status post total right knee replacement  05/26/2021   BPH (benign prostatic hyperplasia) 03/20/2021   Rotator cuff tear, right 12/02/2020   Pigmented nevus 04/18/2020   BMI 27.0-27.9,adult 10/04/2019   PVD (peripheral vascular disease) ([place]) 08/28/2019   Osteoarthritis of left knee 05/04/2019   Mixed hyperlipidemia 04/27/2019   Chronic kidney disease, stage 3a ([place]) 04/27/2019   Type 2 diabetes mellitus with diabetic polyneuropathy ([place]) 04/27/2019   Type 2 diabetes mellitus with stage 3 chronic kidney disease ([place]) 04/27/2019   Benign essential hypertension 04/27/2019   Osteoarthritis of right knee 04/27/2019   Past Medical History:  Diagnosis Date   Arthritis    Benign essential hypertension 04/27/2019   Chronic kidney disease, stage 3a ([place]) 04/27/2019   Mixed hyperlipidemia 04/27/2019   Type 2 diabetes mellitus with diabetic polyneuropathy ([place]) 04/27/2019   Type 2 diabetes mellitus with stage 3 chronic kidney disease ([place]) 04/27/2019    Family History  Problem Relation Age of Onset   Diabetes Mother    Kidney disease Mother    Heart disease Father     Past Surgical History:  Procedure Laterality Date   ELBOW SURGERY Left 1994   KNEE ARTHROSCOPY Right 1998   KNEE CARTILAGE SURGERY Left 1994   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   Procedure: RIGHT TOTAL KNEE ARTHROPLASTY;  Surgeon: Leandrew Koyanagi, MD;  Location: [facility];  Service: Orthopedics;  Laterality:  Right;   Social History   Occupational History   Not on file  Tobacco Use   Smoking status: Former    Years: 8.00    Types: Cigarettes    Quit date: 2000    Years since quitting: 23.4   Smokeless tobacco: Current    Types: Chew   Tobacco comments:    1 can per week  Vaping Use   Vaping Use: Never used  Substance and Sexual Activity   Alcohol use: Yes    Alcohol/week: 7.0 standard drinks    Types: 7 Cans of beer per week    Comment: one beer or one glass of wine per day   Drug use: Never   Sexual activity: Yes    Partners: Female       Post-Op Visit Note   Patient: Clayton James           Date of Birth: 1950-08-10           MRN: [id number] Visit Date: 06/10/2021 PCP: Lillard Anes, MD   Assessment & Plan:  Chief Complaint:  Chief Complaint  Patient presents with   Right Knee - Follow-up    Right total knee arthroplasty 05/26/2021   Visit Diagnoses:  1. Hx of total knee replacement, right     Plan:   Follow-Up Instructions: Return in about 4 weeks (around 07/08/2021).   Orders:  Orders Placed This Encounter  Procedures   Ambulatory referral to Physical Therapy   Meds ordered this encounter  Medications   oxyCODONE-acetaminophen (PERCOCET) 5-325 MG tablet    Sig: Take 1-2 tablets by mouth 2 (two) times daily as needed.    Dispense:  30 tablet    Refill:  0    Imaging: No results found.  PMFS History: Patient Active Problem List   Diagnosis Date Noted   Primary osteoarthritis of right knee 05/26/2021   Status post total right knee replacement 05/26/2021   BPH (benign prostatic hyperplasia) 03/20/2021   Rotator cuff tear, right 12/02/2020   Pigmented nevus 04/18/2020   BMI 27.0-27.9,adult 10/04/2019   PVD (peripheral vascular disease) ([place]) 08/28/2019   Osteoarthritis of left knee 05/04/2019   Mixed hyperlipidemia 04/27/2019   Chronic kidney disease, stage 3a ([place]) 04/27/2019   Type 2 diabetes mellitus with diabetic polyneuropathy ([place]) 04/27/2019   Type 2 diabetes mellitus with stage 3 chronic kidney disease ([place]) 04/27/2019   Benign essential hypertension 04/27/2019   Osteoarthritis of right knee 04/27/2019   Past Medical History:  Diagnosis Date   Arthritis    Benign essential hypertension 04/27/2019   Chronic kidney disease, stage 3a ([place]) 04/27/2019   Mixed hyperlipidemia 04/27/2019   Type 2 diabetes mellitus with diabetic polyneuropathy ([place]) 04/27/2019   Type 2 diabetes mellitus with stage 3 chronic kidney disease ([place]) 04/27/2019    Family History  Problem  Relation Age of Onset   Diabetes Mother    Kidney disease Mother    Heart disease Father     Past Surgical History:  Procedure Laterality Date   ELBOW SURGERY Left 1994   KNEE ARTHROSCOPY Right 1998   KNEE CARTILAGE SURGERY Left 1994   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   Procedure: RIGHT TOTAL KNEE ARTHROPLASTY;  Surgeon: Leandrew Koyanagi, MD;  Location: [facility];  Service: Orthopedics;  Laterality: Right;   Social History   Occupational History   Not on file  Tobacco Use   Smoking status: Former    Years: 8.00    Types: Cigarettes  Quit date: 2000    Years since quitting: 23.4   Smokeless tobacco: Current    Types: Chew   Tobacco comments:    1 can per week  Vaping Use   Vaping Use: Never used  Substance and Sexual Activity   Alcohol use: Yes    Alcohol/week: 7.0 standard drinks    Types: 7 Cans of beer per week    Comment: one beer or one glass of wine per day   Drug use: Never   Sexual activity: Yes    Partners: Female

## 2021-06-11 DIAGNOSIS — E1151 Type 2 diabetes mellitus with diabetic peripheral angiopathy without gangrene: Secondary | ICD-10-CM | POA: Diagnosis not present

## 2021-06-11 DIAGNOSIS — Z96651 Presence of right artificial knee joint: Secondary | ICD-10-CM | POA: Diagnosis not present

## 2021-06-11 DIAGNOSIS — E782 Mixed hyperlipidemia: Secondary | ICD-10-CM | POA: Diagnosis not present

## 2021-06-11 DIAGNOSIS — M1712 Unilateral primary osteoarthritis, left knee: Secondary | ICD-10-CM | POA: Diagnosis not present

## 2021-06-11 DIAGNOSIS — Z794 Long term (current) use of insulin: Secondary | ICD-10-CM | POA: Diagnosis not present

## 2021-06-11 DIAGNOSIS — I10 Essential (primary) hypertension: Secondary | ICD-10-CM | POA: Diagnosis not present

## 2021-06-11 DIAGNOSIS — Z7984 Long term (current) use of oral hypoglycemic drugs: Secondary | ICD-10-CM | POA: Diagnosis not present

## 2021-06-11 DIAGNOSIS — Z471 Aftercare following joint replacement surgery: Secondary | ICD-10-CM | POA: Diagnosis not present

## 2021-06-11 DIAGNOSIS — N400 Enlarged prostate without lower urinary tract symptoms: Secondary | ICD-10-CM | POA: Diagnosis not present

## 2021-06-11 DIAGNOSIS — Z7951 Long term (current) use of inhaled steroids: Secondary | ICD-10-CM | POA: Diagnosis not present

## 2021-06-11 DIAGNOSIS — Z87891 Personal history of nicotine dependence: Secondary | ICD-10-CM | POA: Diagnosis not present

## 2021-06-11 DIAGNOSIS — Z7982 Long term (current) use of aspirin: Secondary | ICD-10-CM | POA: Diagnosis not present

## 2021-06-11 DIAGNOSIS — D229 Melanocytic nevi, unspecified: Secondary | ICD-10-CM | POA: Diagnosis not present

## 2021-06-11 DIAGNOSIS — N1831 Chronic kidney disease, stage 3a: Secondary | ICD-10-CM | POA: Diagnosis not present

## 2021-06-11 DIAGNOSIS — E1122 Type 2 diabetes mellitus with diabetic chronic kidney disease: Secondary | ICD-10-CM | POA: Diagnosis not present

## 2021-06-11 DIAGNOSIS — E1142 Type 2 diabetes mellitus with diabetic polyneuropathy: Secondary | ICD-10-CM | POA: Diagnosis not present

## 2021-06-12 NOTE — Telephone Encounter (Signed)
Pharmacy aware

## 2021-06-18 ENCOUNTER — Telehealth: Payer: Self-pay | Admitting: Orthopaedic Surgery

## 2021-06-18 DIAGNOSIS — M25561 Pain in right knee: Secondary | ICD-10-CM | POA: Diagnosis not present

## 2021-06-18 DIAGNOSIS — M6281 Muscle weakness (generalized): Secondary | ICD-10-CM | POA: Diagnosis not present

## 2021-06-18 NOTE — Telephone Encounter (Signed)
[employer] forms received for spouse. To [employer].

## 2021-06-23 DIAGNOSIS — M6281 Muscle weakness (generalized): Secondary | ICD-10-CM | POA: Diagnosis not present

## 2021-06-23 DIAGNOSIS — M25561 Pain in right knee: Secondary | ICD-10-CM | POA: Diagnosis not present

## 2021-06-24 ENCOUNTER — Other Ambulatory Visit: Payer: Self-pay | Admitting: Legal Medicine

## 2021-06-24 DIAGNOSIS — N401 Enlarged prostate with lower urinary tract symptoms: Secondary | ICD-10-CM

## 2021-06-24 MED ORDER — TAMSULOSIN HCL 0.4 MG PO CAPS: 0.4000 mg | ORAL_CAPSULE | Freq: Every day | ORAL | 3 refills | Status: DC

## 2021-06-25 ENCOUNTER — Telehealth: Payer: Medicare HMO

## 2021-06-25 DIAGNOSIS — M6281 Muscle weakness (generalized): Secondary | ICD-10-CM | POA: Diagnosis not present

## 2021-06-25 DIAGNOSIS — M25561 Pain in right knee: Secondary | ICD-10-CM | POA: Diagnosis not present

## 2021-06-30 DIAGNOSIS — M6281 Muscle weakness (generalized): Secondary | ICD-10-CM | POA: Diagnosis not present

## 2021-06-30 DIAGNOSIS — M25561 Pain in right knee: Secondary | ICD-10-CM | POA: Diagnosis not present

## 2021-07-02 DIAGNOSIS — M6281 Muscle weakness (generalized): Secondary | ICD-10-CM | POA: Diagnosis not present

## 2021-07-02 DIAGNOSIS — M25561 Pain in right knee: Secondary | ICD-10-CM | POA: Diagnosis not present

## 2021-07-07 DIAGNOSIS — M25561 Pain in right knee: Secondary | ICD-10-CM | POA: Diagnosis not present

## 2021-07-07 DIAGNOSIS — M6281 Muscle weakness (generalized): Secondary | ICD-10-CM | POA: Diagnosis not present

## 2021-07-08 ENCOUNTER — Encounter: Payer: Self-pay | Admitting: Legal Medicine

## 2021-07-08 ENCOUNTER — Ambulatory Visit (INDEPENDENT_AMBULATORY_CARE_PROVIDER_SITE_OTHER): Payer: Medicare HMO | Admitting: Legal Medicine

## 2021-07-08 VITALS — BP 120/60 | HR 87 | Temp 97.9°F | Resp 15 | Ht 71.0 in | Wt 185.0 lb

## 2021-07-08 DIAGNOSIS — R350 Frequency of micturition: Secondary | ICD-10-CM

## 2021-07-08 DIAGNOSIS — N1831 Chronic kidney disease, stage 3a: Secondary | ICD-10-CM

## 2021-07-08 DIAGNOSIS — E1142 Type 2 diabetes mellitus with diabetic polyneuropathy: Secondary | ICD-10-CM

## 2021-07-08 DIAGNOSIS — I739 Peripheral vascular disease, unspecified: Secondary | ICD-10-CM

## 2021-07-08 DIAGNOSIS — Z6825 Body mass index (BMI) 25.0-25.9, adult: Secondary | ICD-10-CM

## 2021-07-08 DIAGNOSIS — N401 Enlarged prostate with lower urinary tract symptoms: Secondary | ICD-10-CM | POA: Diagnosis not present

## 2021-07-08 DIAGNOSIS — E1122 Type 2 diabetes mellitus with diabetic chronic kidney disease: Secondary | ICD-10-CM

## 2021-07-08 DIAGNOSIS — I10 Essential (primary) hypertension: Secondary | ICD-10-CM

## 2021-07-08 DIAGNOSIS — E782 Mixed hyperlipidemia: Secondary | ICD-10-CM | POA: Diagnosis not present

## 2021-07-08 NOTE — Progress Notes (Unsigned)
Subjective:  Patient ID: Clayton James, male    DOB: Oct 11, 1950  Age: 71 y.o. MRN: [id number]  Chief Complaint  Patient presents with   Diabetes   Hypertension   Hyperlipidemia    HPI: chronic visit Patient present with type 2 diabetes. Compliance with treatment has been good; patient take medicines as directed, maintains diet and exercise regimen, follows up as directed, and is keeping glucose diary. Current medicines for diabetes Farxiga 10 mg daily, Metformin 1000 mg  twice a day, Tresiba 100 unit/ml 30 units daily. Patient performs foot exams daily and last ophthalmologic exam was 07/04/2020. Last A1C 7.3%.  Patient presents for follow up of hypertension.  Patient tolerating Amlodipine-Benazepril 10-20 mg daily, Aspirin 81 mg daily well without side effects.  Patient was diagnosed with hypertension and has been treated for hypertension for 20 years.Patient is working on maintaining diet and exercise regimen and follows up as directed.  Patient presents with hyperlipidemia.  Compliance with treatment has been good; patient takes medicines as directed, maintains low cholesterol diet, follows up as directed, and maintains exercise regimen.  Patient is using atovastatin without problems.   Current Outpatient Medications on File Prior to Visit  Medication Sig Dispense Refill   ALPRAZolam (XANAX) 0.5 MG tablet Take 1 tablet by mouth twice daily as needed 60 tablet 3   amLODipine-benazepril (LOTREL) 10-20 MG capsule Take 1 capsule by mouth once daily 90 capsule 2   aspirin EC 81 MG tablet Take 1 tablet (81 mg total) by mouth 2 (two) times daily. To be taken after surgery to prevent blood clots 84 tablet 0   atorvastatin (LIPITOR) 40 MG tablet Take 1 tablet (40 mg total) by mouth daily. 90 tablet 2   Blood Glucose Monitoring Suppl (ONE TOUCH ULTRA 2) w/Device KIT USE TO CHECK GLUCOSE THREE TIMES DAILY     diclofenac Sodium (VOLTAREN) 1 % GEL Apply 2 g topically daily as needed (pain).      docusate sodium (COLACE) 100 MG capsule Take 1 capsule (100 mg total) by mouth daily as needed. 30 capsule 2   FARXIGA 10 MG TABS tablet Take 1 tablet by mouth once daily 90 tablet 2   fluticasone (FLONASE) 50 MCG/ACT nasal spray Place 2 sprays into both nostrils daily as needed for allergies or rhinitis.     Insulin Pen Needle (BD PEN NEEDLE MICRO U/F) 32G X 6 MM MISC USE AS DIRECTED ONCE DAILY 100 each 4   metFORMIN (GLUCOPHAGE) 1000 MG tablet Take 1 tablet by mouth twice daily 180 tablet 2   methocarbamol (ROBAXIN-750) 750 MG tablet Take 1 tablet (750 mg total) by mouth 2 (two) times daily as needed for muscle spasms. 20 tablet 2   ondansetron (ZOFRAN) 4 MG tablet Take 1 tablet (4 mg total) by mouth every 8 (eight) hours as needed for nausea or vomiting. 40 tablet 0   oxyCODONE-acetaminophen (PERCOCET) 5-325 MG tablet Take 1-2 tablets by mouth 2 (two) times daily as needed. 30 tablet 0   sildenafil (VIAGRA) 100 MG tablet Take 1 tablet (100 mg total) by mouth daily as needed for erectile dysfunction. 10 tablet 3   tamsulosin (FLOMAX) 0.4 MG CAPS capsule Take 1 capsule (0.4 mg total) by mouth daily. 90 capsule 3   TRESIBA FLEXTOUCH 100 UNIT/ML FlexTouch Pen INJECT 30 UNITS SUBCUTANEOUSLY ONCE DAILY 15 mL 6   No current facility-administered medications on file prior to visit.   Past Medical History:  Diagnosis Date   Arthritis  Benign essential hypertension 04/27/2019   Chronic kidney disease, stage 3a ([place]) 04/27/2019   Mixed hyperlipidemia 04/27/2019   Type 2 diabetes mellitus with diabetic polyneuropathy ([place]) 04/27/2019   Type 2 diabetes mellitus with stage 3 chronic kidney disease ([place]) 04/27/2019   Past Surgical History:  Procedure Laterality Date   ELBOW SURGERY Left 1994   KNEE ARTHROSCOPY Right 1998   KNEE CARTILAGE SURGERY Left 1994   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   Procedure: RIGHT TOTAL KNEE ARTHROPLASTY;  Surgeon: Leandrew Koyanagi, MD;  Location: [facility];  Service:  Orthopedics;  Laterality: Right;    Family History  Problem Relation Age of Onset   Diabetes Mother    Kidney disease Mother    Heart disease Father    Social History   Socioeconomic History   Marital status: Married    Spouse name: Not on file   Number of children: 3   Years of education: Not on file   Highest education level: Not on file  Occupational History   Not on file  Tobacco Use   Smoking status: Former    Years: 8.00    Types: Cigarettes    Quit date: 2000    Years since quitting: 23.4   Smokeless tobacco: Current    Types: Chew   Tobacco comments:    1 can per week  Vaping Use   Vaping Use: Never used  Substance and Sexual Activity   Alcohol use: Yes    Alcohol/week: 7.0 standard drinks of alcohol    Types: 7 Cans of beer per week    Comment: one beer or one glass of wine per day   Drug use: Never   Sexual activity: Yes    Partners: Female  Other Topics Concern   Not on file  Social History Narrative   Oldest son and youngest son passed away   Social Determinants of Health   Financial Resource Strain: Low Risk  (10/21/2020)   Overall Financial Resource Strain (CARDIA)    Difficulty of Paying Living Expenses: Not hard at all  Food Insecurity: No Food Insecurity (04/24/2020)   Hunger Vital Sign    Worried About Running Out of Food in the Last Year: Never true    [place] in the Last Year: Never true  Transportation Needs: No Transportation Needs (10/21/2020)   PRAPARE - Hydrologist (Medical): No    Lack of Transportation (Non-Medical): No  Physical Activity: Not on file  Stress: Not on file  Social Connections: Not on file    Review of Systems  Constitutional:  Negative for activity change and appetite change.  HENT:  Negative for congestion.   Eyes:  Negative for visual disturbance.  Respiratory:  Negative for chest tightness and shortness of breath.   Cardiovascular:  Negative for chest pain, palpitations and  leg swelling.  Gastrointestinal:  Negative for abdominal distention and abdominal pain.  Musculoskeletal:  Negative for arthralgias.       RCT left  Skin: Negative.   Neurological: Negative.   Psychiatric/Behavioral: Negative.       Objective:  BP 120/60   Pulse 87   Temp 97.9 F (36.6 C)   Resp 15   Ht _0  (1.803 m)   Wt 185 lb (83.9 kg)   SpO2 97%   BMI 25.80 kg/m      07/08/2021    1:30 PM 05/27/2021    8:48 AM 05/27/2021    3:42 AM  BP/Weight  Systolic BP 161 096 045  Diastolic BP 60 82 80  Wt. (Lbs) 185    BMI 25.8 kg/m2      Physical Exam Vitals reviewed.  Constitutional:      General: He is not in acute distress.    Appearance: Normal appearance.  HENT:     Head: Normocephalic.     Right Ear: Tympanic membrane normal.     Left Ear: Tympanic membrane normal.     Mouth/Throat:     Mouth: Mucous membranes are moist.     Pharynx: Oropharynx is clear.  Eyes:     Extraocular Movements: Extraocular movements intact.     Conjunctiva/sclera: Conjunctivae normal.     Pupils: Pupils are equal, round, and reactive to light.  Cardiovascular:     Rate and Rhythm: Normal rate and regular rhythm.     Pulses: Normal pulses.     Heart sounds: Normal heart sounds. No murmur heard.    No gallop.  Pulmonary:     Effort: Pulmonary effort is normal. No respiratory distress.     Breath sounds: Normal breath sounds. No wheezing.  Abdominal:     General: Abdomen is flat. Bowel sounds are normal. There is no distension.     Palpations: Abdomen is soft.     Tenderness: There is no abdominal tenderness.  Musculoskeletal:        General: Normal range of motion.     Cervical back: Normal range of motion and neck supple.     Right lower leg: No edema.     Left lower leg: No edema.  Skin:    General: Skin is warm.     Capillary Refill: Capillary refill takes less than 2 seconds.  Neurological:     General: No focal deficit present.     Mental Status: He is alert and  oriented to person, place, and time. Mental status is at baseline.     Sensory: Sensory deficit present.     Gait: Gait normal.     Deep Tendon Reflexes: Reflexes normal.  Psychiatric:        Mood and Affect: Mood normal.        Thought Content: Thought content normal.     Diabetic Foot Exam - Simple   Simple Foot Form Diabetic Foot exam was performed with the following findings: Yes 07/08/2021  1:59 PM  Visual Inspection No deformities, no ulcerations, no other skin breakdown bilaterally: Yes Sensation Testing See comments: Yes Pulse Check Posterior Tibialis and Dorsalis pulse intact bilaterally: Yes Comments Slight decrease sensation      Lab Results  Component Value Date   WBC 6.7 07/08/2021   HGB 14.4 07/08/2021   HCT 42.4 07/08/2021   PLT 326 07/08/2021   GLUCOSE 86 07/08/2021   CHOL 146 07/08/2021   TRIG 88 07/08/2021   HDL 62 07/08/2021   LDLCALC 67 07/08/2021   ALT 13 07/08/2021   AST 14 07/08/2021   NA 141 07/08/2021   K 4.2 07/08/2021   CL 105 07/08/2021   CREATININE 0.74 (L) 07/08/2021   BUN 14 07/08/2021   CO2 21 07/08/2021   HGBA1C 6.8 (H) 07/08/2021   MICROALBUR 80 05/17/2019      Assessment & Plan:   Problem List Items Addressed This Visit       Cardiovascular and Mediastinum   Benign essential hypertension - Primary   Relevant Orders   Comprehensive metabolic panel (Completed)   CBC with Differential/Platelet (Completed) An individual hypertension care  plan was established and reinforced today.  The patient's status was assessed using clinical findings on exam and labs or diagnostic tests. The patient's success at meeting treatment goals on disease specific evidence-based guidelines and found to be well controlled. SELF MANAGEMENT: The patient and I together assessed ways to personally work towards obtaining the recommended goals. RECOMMENDATIONS: avoid decongestants found in common cold remedies, decrease consumption of alcohol, perform  routine monitoring of BP with home BP cuff, exercise, reduction of dietary salt, take medicines as prescribed, try not to miss doses and quit smoking.  Regular exercise and maintaining a healthy weight is needed.  Stress reduction may help. A CLINICAL SUMMARY including written plan identify barriers to care unique to individual due to social or financial issues.  We attempt to mutually creat solutions for individual and family understanding.    PVD (peripheral vascular disease) ([place]) Patient is stable     Endocrine   Type 2 diabetes mellitus with diabetic polyneuropathy (HCC)   Relevant Orders   Hemoglobin A1c (Completed) An individual care plan for diabetes was established and reinforced today.  The patient's status was assessed using clinical findings on exam, labs and diagnostic testing. Patient success at meeting goals based on disease specific evidence-based guidelines and found to be good controlled. Medications were assessed and patient's understanding of the medical issues , including barriers were assessed. Recommend adherence to a diabetic diet, a graduated exercise program, HgbA1c level is checked quarterly, and urine microalbumin performed yearly .  Annual mono-filament sensation testing performed. Lower blood pressure and control hyperlipidemia is important. Get annual eye exams and annual flu shots and smoking cessation discussed.  Self management goals were discussed.    Type 2 diabetes mellitus with stage 3 chronic kidney disease ([place]) An individual care plan for diabetes was established and reinforced today.  The patient's status was assessed using clinical findings on exam, labs and diagnostic testing. Patient success at meeting goals based on disease specific evidence-based guidelines and found to be good controlled. Medications were assessed and patient's understanding of the medical issues , including barriers were assessed. Recommend adherence to a diabetic diet, a graduated  exercise program, HgbA1c level is checked quarterly, and urine microalbumin performed yearly .  Annual mono-filament sensation testing performed. Lower blood pressure and control hyperlipidemia is important. Get annual eye exams and annual flu shots and smoking cessation discussed.  Self management goals were discussed.      Genitourinary   Chronic kidney disease, stage 3a ([place]) Patient was evaluated for stage 3a.  It is important to maintain good Blood Pressure control and diabetes. Keep on low protein diet and remain with adequate hydration to maintain function.     BPH (benign prostatic hyperplasia)   Relevant Orders   PSA (Completed) AN INDIVIDUAL CARE PLAN for BPH was established and reinforced today.  The patient's status was assessed using clinical findings on exam, labs, and other diagnostic testing. Patient's success at meeting treatment goals based on disease specific evidence-bassed guidelines and found to be in good control. RECOMMENDATIONS include maintaining present medicines and treatment.      Other   Mixed hyperlipidemia   Relevant Orders   Lipid panel (Completed) AN INDIVIDUAL CARE PLAN for hyperlipidemia/ cholesterol was established and reinforced today.  The patient's status was assessed using clinical findings on exam, lab and other diagnostic tests. The patient's disease status was assessed based on evidence-based guidelines and found to be fair controlled. MEDICATIONS were reviewed. SELF MANAGEMENT GOALS have been discussed  and patient's success at attaining the goal of low cholesterol was assessed. RECOMMENDATION given include regular exercise 3 days a week and low cholesterol/low fat diet. CLINICAL SUMMARY including written plan to identify barriers unique to the patient due to social or economic  reasons was discussed.    BMI 25.0-25.9,adult Continue diet and exercise  .    Orders Placed This Encounter  Procedures   Comprehensive metabolic panel   Hemoglobin  A1c   Lipid panel   CBC with Differential/Platelet   PSA   Cardiovascular Risk Assessment     Follow-up: Return in about 4 months (around 11/07/2021) for fasting.  An After Visit Summary was printed and given to the patient.  Reinaldo Meeker, MD [facility] [phone]

## 2021-07-09 DIAGNOSIS — M6281 Muscle weakness (generalized): Secondary | ICD-10-CM | POA: Diagnosis not present

## 2021-07-09 DIAGNOSIS — M25561 Pain in right knee: Secondary | ICD-10-CM | POA: Diagnosis not present

## 2021-07-09 LAB — CBC WITH DIFFERENTIAL/PLATELET
Basophils Absolute: 0.1 10*3/uL (ref 0.0–0.2)
Basos: 1 %
EOS (ABSOLUTE): 0.3 10*3/uL (ref 0.0–0.4)
Eos: 5 %
Hematocrit: 42.4 % (ref 37.5–51.0)
Hemoglobin: 14.4 g/dL (ref 13.0–17.7)
Immature Grans (Abs): 0 10*3/uL (ref 0.0–0.1)
Immature Granulocytes: 0 %
Lymphocytes Absolute: 1.9 10*3/uL (ref 0.7–3.1)
Lymphs: 28 %
MCH: 30.6 pg (ref 26.6–33.0)
MCHC: 34 g/dL (ref 31.5–35.7)
MCV: 90 fL (ref 79–97)
Monocytes Absolute: 0.6 10*3/uL (ref 0.1–0.9)
Monocytes: 9 %
Neutrophils Absolute: 3.8 10*3/uL (ref 1.4–7.0)
Neutrophils: 57 %
Platelets: 326 10*3/uL (ref 150–450)
RBC: 4.7 x10E6/uL (ref 4.14–5.80)
RDW: 13.6 % (ref 11.6–15.4)
WBC: 6.7 10*3/uL (ref 3.4–10.8)

## 2021-07-09 LAB — COMPREHENSIVE METABOLIC PANEL
ALT: 13 IU/L (ref 0–44)
AST: 14 IU/L (ref 0–40)
Albumin/Globulin Ratio: 2 (ref 1.2–2.2)
Albumin: 4.5 g/dL (ref 3.7–4.7)
Alkaline Phosphatase: 82 IU/L (ref 44–121)
BUN/Creatinine Ratio: 19 (ref 10–24)
BUN: 14 mg/dL (ref 8–27)
Bilirubin Total: 0.8 mg/dL (ref 0.0–1.2)
CO2: 21 mmol/L (ref 20–29)
Calcium: 9.7 mg/dL (ref 8.6–10.2)
Chloride: 105 mmol/L (ref 96–106)
Creatinine, Ser: 0.74 mg/dL — ABNORMAL LOW (ref 0.76–1.27)
Globulin, Total: 2.3 g/dL (ref 1.5–4.5)
Glucose: 86 mg/dL (ref 70–99)
Potassium: 4.2 mmol/L (ref 3.5–5.2)
Sodium: 141 mmol/L (ref 134–144)
Total Protein: 6.8 g/dL (ref 6.0–8.5)
eGFR: 97 mL/min/{1.73_m2} (ref >59)

## 2021-07-09 LAB — PSA: Prostate Specific Ag, Serum: 2.3 ng/mL (ref 0.0–4.0)

## 2021-07-09 LAB — CARDIOVASCULAR RISK ASSESSMENT

## 2021-07-09 LAB — LIPID PANEL
Chol/HDL Ratio: 2.4 ratio (ref 0.0–5.0)
Cholesterol, Total: 146 mg/dL (ref 100–199)
HDL: 62 mg/dL (ref >39)
LDL Chol Calc (NIH): 67 mg/dL (ref 0–99)
Triglycerides: 88 mg/dL (ref 0–149)
VLDL Cholesterol Cal: 17 mg/dL (ref 5–40)

## 2021-07-09 LAB — HEMOGLOBIN A1C
Est. average glucose Bld gHb Est-mCnc: 148 mg/dL
Hgb A1c MFr Bld: 6.8 % — ABNORMAL HIGH (ref 4.8–5.6)

## 2021-07-14 DIAGNOSIS — M6281 Muscle weakness (generalized): Secondary | ICD-10-CM | POA: Diagnosis not present

## 2021-07-14 DIAGNOSIS — M25561 Pain in right knee: Secondary | ICD-10-CM | POA: Diagnosis not present

## 2021-07-15 ENCOUNTER — Ambulatory Visit (INDEPENDENT_AMBULATORY_CARE_PROVIDER_SITE_OTHER): Payer: Medicare HMO

## 2021-07-15 ENCOUNTER — Ambulatory Visit (INDEPENDENT_AMBULATORY_CARE_PROVIDER_SITE_OTHER): Payer: Medicare HMO | Admitting: Orthopaedic Surgery

## 2021-07-15 DIAGNOSIS — Z96651 Presence of right artificial knee joint: Secondary | ICD-10-CM

## 2021-07-30 ENCOUNTER — Other Ambulatory Visit: Payer: Self-pay

## 2021-07-30 MED ORDER — DAPAGLIFLOZIN PROPANEDIOL 10 MG PO TABS: 10.0000 mg | ORAL_TABLET | Freq: Every day | ORAL | 2 refills | Status: DC

## 2021-08-05 ENCOUNTER — Telehealth: Payer: Self-pay

## 2021-08-05 NOTE — Telephone Encounter (Signed)
-----   Message from Eulis Canner sent at 08/05/2021  2:34 PM EDT ----- Regarding: Lab work Good afternoon,  Spoke to pt today and he was wondering why he did not get a call about his 07/08/21 bloodwork. I see the labs but do not see any result notes. Please call pt with this.  Thank you  Elray Mcgregor, [place] Pharmacist Assistant  [phone]

## 2021-08-05 NOTE — Telephone Encounter (Signed)
Please review labs from 07/08/21 appointment  Clayton James 08/05/21 3:34 PM

## 2021-08-05 NOTE — Telephone Encounter (Signed)
Patient was informed.

## 2021-08-05 NOTE — Telephone Encounter (Signed)
I reviewed labs.  Kidney and liver tests normal, A1c 6.8 good, cholesterol normal, CBC normal, PSA 2.3 normal Call patient lp

## 2021-08-07 ENCOUNTER — Ambulatory Visit (INDEPENDENT_AMBULATORY_CARE_PROVIDER_SITE_OTHER): Payer: Medicare HMO

## 2021-08-07 DIAGNOSIS — E782 Mixed hyperlipidemia: Secondary | ICD-10-CM

## 2021-08-07 DIAGNOSIS — I10 Essential (primary) hypertension: Secondary | ICD-10-CM

## 2021-08-07 DIAGNOSIS — E1142 Type 2 diabetes mellitus with diabetic polyneuropathy: Secondary | ICD-10-CM

## 2021-08-07 NOTE — Patient Instructions (Signed)
Visit Information   Goals Addressed   None    Patient Care Plan: CCM Pharmacy Care Plan     Problem Identified: DM, HTN, Mental health, Lipids   Priority: High  Onset Date: 10/21/2020     Long-Range Goal: Disease State Management   Start Date: 10/21/2020  Expected End Date: 10/21/2021  Recent Progress: On track  Priority: High  Note:   Current Barriers:  Does not maintain contact with provider office Does not contact provider office for questions/concerns  Pharmacist Clinical Goal(s):  Patient will contact provider office for questions/concerns as evidenced notation of same in electronic health record through collaboration with PharmD and provider.   Interventions: 1:1 collaboration with Lillard Anes, MD regarding development and update of comprehensive plan of care as evidenced by provider attestation and co-signature Inter-disciplinary care team collaboration (see longitudinal plan of care) Comprehensive medication review performed; medication list updated in electronic medical record  Hypertension (BP goal <140/90) BP Readings from Last 3 Encounters:  07/08/21 120/60  05/27/21 140/82  05/20/21 (!) 167/93  -Controlled -Current treatment: Amlodipine/Benazapril 10/62m QD Appropriate, Effective, Safe, Accessible -Medications previously tried: N/A  -Current home readings: Doesn't test -Current dietary habits: "Tries to eat Healthy" -Current exercise habits: Does chores around the house -Denies hypotensive/hypertensive symptoms -Educated on BP goals and benefits of medications for prevention of heart attack, stroke and kidney damage; Extensive time spent on counseling patient on blood pressure goal and impact of each antihypertensive medication on their blood pressure and risk reduction for CV disease.  Used analogies to explain the need for multiple antihypertensive medications to achieve BP goals and that it is often a silent disease with no symptoms.  -Counseled  to monitor BP at home PRN, document, and provide log at future appointments -Recommended to continue current medication  Hyperlipidemia: (LDL goal < 100) Lab Results  Component Value Date   CHOL 146 07/08/2021   CHOL 198 04/03/2021   CHOL 193 12/02/2020   Lab Results  Component Value Date   HDL 62 07/08/2021   HDL 60 04/03/2021   HDL 65 12/02/2020   Lab Results  Component Value Date   LDLCALC 67 07/08/2021   LDLCALC 117 (H) 04/03/2021   LDLCALC 108 (H) 12/02/2020   Lab Results  Component Value Date   TRIG 88 07/08/2021   TRIG 117 04/03/2021   TRIG 113 12/02/2020   Lab Results  Component Value Date   CHOLHDL 2.4 07/08/2021   CHOLHDL 3.3 04/03/2021   CHOLHDL 3.0 12/02/2020  No results found for: "LDLDIRECT" -Controlled -Current treatment: Atorvastatin 461mAppropriate, Effective, Safe, Accessible -Medications previously tried: N/A  -Current dietary habits: "Tries to eat Healthy" -Current exercise habits: Does chores around the house -Educated on Cholesterol goals;  Over 15 minutes spent counseling patient on role of hyperlipidemia on heart disease and the impact of each lipid subclass with emphasis on LDL and heart disease risk.  Explained role of statins in prevention of CV disease complications and actual risk for adverse effects with statin treatment.  Counseled patient on genetic component placing them at risk for hyperlipidemia. -Recommended to continue current medication  Diabetes (A1c goal <7%) Lab Results  Component Value Date   HGBA1C 6.8 (H) 07/08/2021   HGBA1C 7.3 (H) 04/03/2021   HGBA1C 6.3 (H) 12/02/2020   Lab Results  Component Value Date   MICROALBUR 80 05/17/2019   LDLCALC 67 07/08/2021   CREATININE 0.74 (L) 07/08/2021   Lab Results  Component Value Date   NA 141  07/08/2021   K 4.2 07/08/2021   CREATININE 0.74 (L) 07/08/2021   EGFR 97 07/08/2021   GFRNONAA >60 05/20/2021   GLUCOSE 86 07/08/2021   Lab Results  Component Value Date   WBC  6.7 07/08/2021   HGB 14.4 07/08/2021   HCT 42.4 07/08/2021   MCV 90 07/08/2021   PLT 326 07/08/2021  -Controlled -Current medications: Farxiga 55m Appropriate, Effective, Safe, Query accessible Tresiba 30 units Appropriate, Effective, Safe, Query accessible Metformin 10067mAppropriate, Effective, Safe, Accessible -Medications previously tried: N/A  -Current home glucose readings fasting glucose: Didn't have logs on him post prandial glucose: Didn't have logs on him -Denies hypoglycemic/hyperglycemic symptoms -Current meal patterns: Patient didn't answer initial question and since his labs are good, I didn't repeat myself -Current exercise: Chores -Educated on A1c and blood sugar goals; Extensive time was spent counseling patient on the pathophysiology and risk for complications with uncontrolled Diabetes.  Used teach back method for counseling on ADA diabetic diet.  Time spent on explaining role of carbohydrates versus sugars impacting blood glucose levels and meal planning. Current exercise consists of walking 10 minutes three times a week, plan is to increase to 20 minutes 5 days a week and then after 2-3 weeks increase to 30 minute 5 days a week.  -Counseled to check feet daily and get yearly eye exams July 2023: PAP for Farxiga/Tresiba. Makes $45,000/year between him and wife  Depression/Anxiety -Patient has lost 2 of his sons, said, "If I didn't take this medicine my mind would just race and I wouldn't be able to focus or sleep or anything" -Controlled -Current treatment: Alprazolam BID Appropriate, Effective, Safe, Accessible -Medications previously tried/failed: N/A -PHQ9:     04/03/2021    7:43 AM 06/20/2020    9:07 AM 04/24/2020    2:29 PM  Depression screen PHQ 2/9  Decreased Interest 0 0 0  Down, Depressed, Hopeless 0 0 0  PHQ - 2 Score 0 0 0  -GAD7:      No data to display        -Educated on Benefits of medication for symptom control October 2022: Patient could  try preventative therapy (SSRI/SNRI/Etc) so he doesn't have to take BNZ as often, patient wasn't open to changing any meds today. July 2023: Same as before  Pain Controlled Pain Scale: Without Meds  -October 2022: 8/10 With Meds: -October 2022: 3-4/10 (Is able to do chores, just is slower) -Current medications: Diclofenac Appropriate, Effective, Safe, Accessible Oxycodone/APAP 5/325 Appropriate, Effective, Safe, Accessible -Medications previously tried: N/A July 2023: Patient's priority was cost of meds. Unable to do pain scale but he did say that post knee Sx in May, he feels significantly better    Patient Goals/Self-Care Activities Patient will:  - take medications as prescribed  Follow Up Plan: Face to Face appointment with care management team member scheduled for:  Sept 2023  [facility] - - [phone]     Mr. KeBastonas given information about Chronic Care Management services today including:  CCM service includes personalized support from designated clinical staff supervised by his physician, including individualized plan of care and coordination with other care providers 24/7 contact phone numbers for assistance for urgent and routine care needs. Standard insurance, coinsurance, copays and deductibles apply for chronic care management only during months in which we provide at least 20 minutes of these services. Most insurances cover these services at 100%, however patients may be responsible for any copay, coinsurance and/or deductible if applicable. This  service may help you avoid the need for more expensive face-to-face services. Only one practitioner may furnish and bill the service in a calendar month. The patient may stop CCM services at any time (effective at the end of the month) by phone call to the office staff.  Patient agreed to services and verbal consent obtained.   The patient verbalized understanding of instructions, educational materials,  and care plan provided today and DECLINED offer to receive copy of patient instructions, educational materials, and care plan.  The pharmacy team will reach out to the patient again over the next 60 days.   Lane Hacker, [place]

## 2021-08-07 NOTE — Progress Notes (Signed)
 Chronic Care Management Pharmacy Note  08/07/2021 Name:  Clayton James MRN:  [id number] DOB:  11/20/1950  Summary: -Pleasant 71 year old male presents for initial CCM visit. 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 While doing this, 2 of his sons died and he decided to retire. 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 He hunts deer every year.  Recommendations/Changes made from today's visit: -PAP for Tresiba/Farxiga    Subjective: Clayton James is an 71 y.o. year old male who is a primary patient of Perry, Lawrence Edward, MD.  The CCM team was consulted for assistance with disease management and care coordination needs.    Engaged with patient by telephone for follow up visit in response to provider referral for pharmacy case management and/or care coordination services.   Consent to Services:  The patient was given the following information about Chronic Care Management services today, agreed to services, and gave verbal consent: 1. CCM service includes personalized support from designated clinical staff supervised by the primary care provider, including individualized plan of care and coordination with other care providers 2. 24/7 contact phone numbers for assistance for urgent and routine care needs. 3. Service will only be billed when office clinical staff spend 20 minutes or more in a month to coordinate care. 4. Only one practitioner may furnish and bill the service in a calendar month. 5.The patient may stop CCM services at any time (effective at the end of the month) by phone call to the office staff. 6. The patient will be responsible for cost sharing (co-pay) of up to 20% of the service fee (after annual deductible is met). Patient agreed to services and consent  obtained.  Patient Care Team: Perry, Lawrence Edward, MD as PCP - General (Family Medicine) Kennedy, Nathan K, RPH (Pharmacist)  Recent office visits:  04-03-2021 Perry, Lawrence Edward, MD. Albumin/Globulin= 2.3. A1C= 7.3. LDL= 117   Recent consult visits:  05-26-2021 James, Clayton M, MD (Neurosurgery). Right total knee arthroplasty procedure completed.    05-20-2021 Pre-op testing   Hospital visits:  None in previous 6 months   Objective:  Lab Results  Component Value Date   CREATININE 0.74 (L) 07/08/2021   BUN 14 07/08/2021   GFRNONAA >60 05/20/2021   GFRAA 109 02/15/2020   NA 141 07/08/2021   K 4.2 07/08/2021   CALCIUM 9.7 07/08/2021   CO2 21 07/08/2021   GLUCOSE 86 07/08/2021    Lab Results  Component Value Date/Time   HGBA1C 6.8 (H) 07/08/2021 02:18 PM   HGBA1C 7.3 (H) 04/03/2021 08:23 AM   MICROALBUR 80 05/17/2019 09:03 AM    Last diabetic Eye exam:  Lab Results  Component Value Date/Time   HMDIABEYEEXA No Retinopathy 07/04/2020 12:00 AM    Last diabetic Foot exam: No results found for: "HMDIABFOOTEX"   Lab Results  Component Value Date   CHOL 146 07/08/2021   HDL 62 07/08/2021   LDLCALC 67 07/08/2021   TRIG 88 07/08/2021   CHOLHDL 2.4 07/08/2021       Latest Ref Rng & Units 07/08/2021    2:18 PM 05/20/2021   11:21 AM 04/03/2021    8:23 AM  Hepatic Function  Total Protein 6.0 - 8.5 g/dL 6.8  7.1  6.2   Albumin 3.7 -   4.7 g/dL 4.5  4.2  4.3   AST 0 - 40 IU/L 14  19  21   ALT 0 - 44 IU/L 13  24  23   Alk Phosphatase 44 - 121 IU/L 82  75  79   Total Bilirubin 0.0 - 1.2 mg/dL 0.8  0.7  0.5     No results found for: "TSH", "FREET4"     Latest Ref Rng & Units 07/08/2021    2:18 PM 05/27/2021    5:59 AM 05/20/2021   11:21 AM  CBC  WBC 3.4 - 10.8 x10E3/uL 6.7  10.1  9.7   Hemoglobin 13.0 - 17.7 g/dL 14.4  13.6  16.6   Hematocrit 37.5 - 51.0 % 42.4  40.4  48.4   Platelets 150 - 450 x10E3/uL 326  228  307     No results found for:  "VD25OH"  Clinical ASCVD: Yes  The 10-year ASCVD risk score (Arnett DK, et al., 2019) is: 28.5%   Values used to calculate the score:     Age: 71 years     Sex: Male     Is Non-Hispanic African American: No     Diabetic: Yes     Tobacco smoker: No     Systolic Blood Pressure: 120 mmHg     Is BP treated: Yes     HDL Cholesterol: 62 mg/dL     Total Cholesterol: 146 mg/dL       04/03/2021    7:43 AM 06/20/2020    9:07 AM 04/24/2020    2:29 PM  Depression screen PHQ 2/9  Decreased Interest 0 0 0  Down, Depressed, Hopeless 0 0 0  PHQ - 2 Score 0 0 0     Other: (CHADS2VASc if Afib, MMRC or CAT for COPD, ACT, DEXA)  Social History   Tobacco Use  Smoking Status Former   Years: 8.00   Types: Cigarettes   Quit date: 2000   Years since quitting: 23.5  Smokeless Tobacco Current   Types: Chew  Tobacco Comments   1 can per week   BP Readings from Last 3 Encounters:  07/08/21 120/60  05/27/21 140/82  05/20/21 (!) 167/93   Pulse Readings from Last 3 Encounters:  07/08/21 87  05/27/21 75  05/20/21 (!) 101   Wt Readings from Last 3 Encounters:  07/08/21 185 lb (83.9 kg)  05/26/21 190 lb (86.2 kg)  05/20/21 192 lb 14.4 oz (87.5 kg)   BMI Readings from Last 3 Encounters:  07/08/21 25.80 kg/m  05/26/21 26.50 kg/m  05/20/21 26.90 kg/m    Assessment/Interventions: Review of patient past medical history, allergies, medications, health status, including review of consultants reports, laboratory and other test data, was performed as part of comprehensive evaluation and provision of chronic care management services.   SDOH:  (Social Determinants of Health) assessments and interventions performed: Yes SDOH Interventions    Flowsheet Row Most Recent Value  SDOH Interventions   Financial Strain Interventions Other (Comment)  [(Farxiga/Tresiba PAP)]  Transportation Interventions Intervention Not Indicated      SDOH Screenings   Alcohol Screen: Low Risk  (06/20/2020)    Alcohol Screen    Last Alcohol Screening Score (AUDIT): 1  Depression (PHQ2-9): Low Risk  (04/03/2021)   Depression (PHQ2-9)    PHQ-2 Score: 0  Financial Resource Strain: High Risk (08/07/2021)   Overall Financial Resource Strain (CARDIA)    Difficulty of Paying Living Expenses: Hard  Food Insecurity: No Food Insecurity (04/24/2020)   Hunger   Vital Sign    Worried About Running Out of Food in the Last Year: Never true    Ran Out of Food in the Last Year: Never true  Housing: Low Risk  (04/24/2020)   Housing    Last Housing Risk Score: 0  Physical Activity: Not on file  Social Connections: Not on file  Stress: Not on file  Tobacco Use: High Risk (07/08/2021)   Patient History    Smoking Tobacco Use: Former    Smokeless Tobacco Use: Current    Passive Exposure: Not on file  Transportation Needs: No Transportation Needs (08/07/2021)   PRAPARE - Transportation    Lack of Transportation (Medical): No    Lack of Transportation (Non-Medical): No    CCM Care Plan  Allergies  Allergen Reactions   Sulfamethoxazole Rash    Medications Reviewed Today     Reviewed by Kennedy, Nathan K, RPH (Pharmacist) on 08/07/21 at 1124  Med List Status: <None>   Medication Order Taking? Sig Documenting Provider Last Dose Status Informant  ALPRAZolam (XANAX) 0.5 MG tablet [id number] No Take 1 tablet by mouth twice daily as needed Perry, Lawrence Edward, MD Taking Active Self  amLODipine-benazepril (LOTREL) 10-20 MG capsule [id number] No Take 1 capsule by mouth once daily Perry, Lawrence Edward, MD Taking Active Self  aspirin EC 81 MG tablet [id number] No Take 1 tablet (81 mg total) by mouth 2 (two) times daily. To be taken after surgery to prevent blood clots Stanbery, Mary L, PA-C Taking Active   atorvastatin (LIPITOR) 40 MG tablet [id number] No Take 1 tablet (40 mg total) by mouth daily. Perry, Lawrence Edward, MD Taking Active Self  Blood Glucose Monitoring Suppl (ONE TOUCH ULTRA 2) w/Device KIT [id number] No  USE TO CHECK GLUCOSE THREE TIMES DAILY [provider] Taking Active Self  dapagliflozin propanediol (FARXIGA) 10 MG TABS tablet [id number]  Take 1 tablet (10 mg total) by mouth daily. Perry, Lawrence Edward, MD  Active   diclofenac Sodium (VOLTAREN) 1 % GEL [id number] No Apply 2 g topically daily as needed (pain). [provider] Taking Active Self  docusate sodium (COLACE) 100 MG capsule [id number] No Take 1 capsule (100 mg total) by mouth daily as needed. Stanbery, Mary L, PA-C Taking Active   fluticasone (FLONASE) 50 MCG/ACT nasal spray [id number] No Place 2 sprays into both nostrils daily as needed for allergies or rhinitis. [provider] Taking Active Self  Insulin Pen Needle (BD PEN NEEDLE MICRO U/F) 32G X 6 MM MISC [id number] No USE AS DIRECTED ONCE DAILY Perry, Lawrence Edward, MD Taking Active Self  metFORMIN (GLUCOPHAGE) 1000 MG tablet [id number] No Take 1 tablet by mouth twice daily Perry, Lawrence Edward, MD Taking Active Self  methocarbamol (ROBAXIN-750) 750 MG tablet [id number] No Take 1 tablet (750 mg total) by mouth 2 (two) times daily as needed for muscle spasms. Stanbery, Mary L, PA-C Taking Active   ondansetron (ZOFRAN) 4 MG tablet [id number] No Take 1 tablet (4 mg total) by mouth every 8 (eight) hours as needed for nausea or vomiting. Stanbery, Mary L, PA-C Taking Active   oxyCODONE-acetaminophen (PERCOCET) 5-325 MG tablet [id number] No Take 1-2 tablets by mouth 2 (two) times daily as needed. Stanbery, Mary L, PA-C Taking Active   sildenafil (VIAGRA) 100 MG tablet [id number] No Take 1 tablet (100 mg total) by mouth daily as needed for erectile dysfunction. Perry, Lawrence Edward, MD Taking Active Self  tamsulosin (FLOMAX) 0.4 MG CAPS capsule [id number] No Take 1 capsule (0.4 mg total) by   mouth daily. Perry, Lawrence Edward, MD Taking Active   TRESIBA FLEXTOUCH 100 UNIT/ML FlexTouch Pen [id number] No INJECT 30 UNITS SUBCUTANEOUSLY ONCE DAILY Perry, Lawrence Edward, MD  Taking Active Self  Med List Note (Smith, Kimberly M, LPN 06/25/20 0750): Patient approved for Farxiga Patient Assistance from [place] 06/23/20 through 01/18/21            Patient Active Problem List   Diagnosis Date Noted   Primary osteoarthritis of right knee 05/26/2021   Status post total right knee replacement 05/26/2021   BPH (benign prostatic hyperplasia) 03/20/2021   Rotator cuff tear, right 12/02/2020   Pigmented nevus 04/18/2020   BMI 25.0-25.9,adult 10/04/2019   PVD (peripheral vascular disease) (HCC) 08/28/2019   Osteoarthritis of left knee 05/04/2019   Mixed hyperlipidemia 04/27/2019   Chronic kidney disease, stage 3a (HCC) 04/27/2019   Type 2 diabetes mellitus with diabetic polyneuropathy (HCC) 04/27/2019   Type 2 diabetes mellitus with stage 3 chronic kidney disease (HCC) 04/27/2019   Benign essential hypertension 04/27/2019   Osteoarthritis of right knee 04/27/2019    Immunization History  Administered Date(s) Administered   Fluad Quad(high Dose 65+) 12/02/2020   Influenza-Unspecified 11/16/2018   PFIZER Comirnaty(Gray Top)Covid-19 Tri-Sucrose Vaccine 03/30/2019, 04/25/2019   Pneumococcal Polysaccharide-23 04/24/2020    Conditions to be addressed/monitored:  Hypertension, Hyperlipidemia, Diabetes, and Anxiety  Care Plan : CCM Pharmacy Care Plan  Updates made by Kennedy, Nathan K, RPH since 08/07/2021 12:00 AM     Problem: DM, HTN, Mental health, Lipids   Priority: High  Onset Date: 10/21/2020     Long-Range Goal: Disease State Management   Start Date: 10/21/2020  Expected End Date: 10/21/2021  Recent Progress: On track  Priority: High  Note:   Current Barriers:  Does not maintain contact with provider office Does not contact provider office for questions/concerns  Pharmacist Clinical Goal(s):  Patient will contact provider office for questions/concerns as evidenced notation of same in electronic health record through collaboration with PharmD and  provider.   Interventions: 1:1 collaboration with Perry, Lawrence Edward, MD regarding development and update of comprehensive plan of care as evidenced by provider attestation and co-signature Inter-disciplinary care team collaboration (see longitudinal plan of care) Comprehensive medication review performed; medication list updated in electronic medical record  Hypertension (BP goal <140/90) BP Readings from Last 3 Encounters:  07/08/21 120/60  05/27/21 140/82  05/20/21 (!) 167/93  -Controlled -Current treatment: Amlodipine/Benazapril 10/20mg QD Appropriate, Effective, Safe, Accessible -Medications previously tried: N/A  -Current home readings: Doesn't test -Current dietary habits: "Tries to eat Healthy" -Current exercise habits: Does chores around the house -Denies hypotensive/hypertensive symptoms -Educated on BP goals and benefits of medications for prevention of heart attack, stroke and kidney damage; Extensive time spent on counseling patient on blood pressure goal and impact of each antihypertensive medication on their blood pressure and risk reduction for CV disease.  Used analogies to explain the need for multiple antihypertensive medications to achieve BP goals and that it is often a silent disease with no symptoms.  -Counseled to monitor BP at home PRN, document, and provide log at future appointments -Recommended to continue current medication  Hyperlipidemia: (LDL goal < 100) Lab Results  Component Value Date   CHOL 146 07/08/2021   CHOL 198 04/03/2021   CHOL 193 12/02/2020   Lab Results  Component Value Date   HDL 62 07/08/2021   HDL 60 04/03/2021   HDL 65 12/02/2020   Lab Results  Component Value Date   LDLCALC 67 07/08/2021     LDLCALC 117 (H) 04/03/2021   LDLCALC 108 (H) 12/02/2020   Lab Results  Component Value Date   TRIG 88 07/08/2021   TRIG 117 04/03/2021   TRIG 113 12/02/2020   Lab Results  Component Value Date   CHOLHDL 2.4 07/08/2021    CHOLHDL 3.3 04/03/2021   CHOLHDL 3.0 12/02/2020  No results found for: "LDLDIRECT" -Controlled -Current treatment: Atorvastatin 43m Appropriate, Effective, Safe, Accessible -Medications previously tried: N/A  -Current dietary habits: "Tries to eat Healthy" -Current exercise habits: Does chores around the house -Educated on Cholesterol goals;  Over 15 minutes spent counseling patient on role of hyperlipidemia on heart disease and the impact of each lipid subclass with emphasis on LDL and heart disease risk.  Explained role of statins in prevention of CV disease complications and actual risk for adverse effects with statin treatment.  Counseled patient on genetic component placing them at risk for hyperlipidemia. -Recommended to continue current medication  Diabetes (A1c goal <7%) Lab Results  Component Value Date   HGBA1C 6.8 (H) 07/08/2021   HGBA1C 7.3 (H) 04/03/2021   HGBA1C 6.3 (H) 12/02/2020   Lab Results  Component Value Date   MICROALBUR 80 05/17/2019   LDLCALC 67 07/08/2021   CREATININE 0.74 (L) 07/08/2021   Lab Results  Component Value Date   NA 141 07/08/2021   K 4.2 07/08/2021   CREATININE 0.74 (L) 07/08/2021   EGFR 97 07/08/2021   GFRNONAA >60 05/20/2021   GLUCOSE 86 07/08/2021   Lab Results  Component Value Date   WBC 6.7 07/08/2021   HGB 14.4 07/08/2021   HCT 42.4 07/08/2021   MCV 90 07/08/2021   PLT 326 07/08/2021  -Controlled -Current medications: Farxiga 12mAppropriate, Effective, Safe, Query accessible Tresiba 30 units Appropriate, Effective, Safe, Query accessible Metformin 100059mppropriate, Effective, Safe, Accessible -Medications previously tried: N/A  -Current home glucose readings fasting glucose: Didn't have logs on him post prandial glucose: Didn't have logs on him -Denies hypoglycemic/hyperglycemic symptoms -Current meal patterns: Patient didn't answer initial question and since his labs are good, I didn't repeat myself -Current  exercise: Chores -Educated on A1c and blood sugar goals; Extensive time was spent counseling patient on the pathophysiology and risk for complications with uncontrolled Diabetes.  Used teach back method for counseling on ADA diabetic diet.  Time spent on explaining role of carbohydrates versus sugars impacting blood glucose levels and meal planning. Current exercise consists of walking 10 minutes three times a week, plan is to increase to 20 minutes 5 days a week and then after 2-3 weeks increase to 30 minute 5 days a week.  -Counseled to check feet daily and get yearly eye exams July 2023: PAP for Farxiga/Tresiba. Makes $45,000/year between him and wife  Depression/Anxiety -Patient has lost 2 of his sons, said, "If I didn't take this medicine my mind would just race and I wouldn't be able to focus or sleep or anything" -Controlled -Current treatment: Alprazolam BID Appropriate, Effective, Safe, Accessible -Medications previously tried/failed: N/A -PHQ9:     04/03/2021    7:43 AM 06/20/2020    9:07 AM 04/24/2020    2:29 PM  Depression screen PHQ 2/9  Decreased Interest 0 0 0  Down, Depressed, Hopeless 0 0 0  PHQ - 2 Score 0 0 0  -GAD7:      No data to display        -Educated on Benefits of medication for symptom control October 2022: Patient could try preventative therapy (SSRI/SNRI/Etc) so he doesn't  have to take BNZ as often, patient wasn't open to changing any meds today. July 2023: Same as before  Pain Controlled Pain Scale: Without Meds  -October 2022: 8/10 With Meds: -October 2022: 3-4/10 (Is able to do chores, just is slower) -Current medications: Diclofenac Appropriate, Effective, Safe, Accessible Oxycodone/APAP 5/325 Appropriate, Effective, Safe, Accessible -Medications previously tried: N/A July 2023: Patient's priority was cost of meds. Unable to do pain scale but he did say that post knee Sx in May, he feels significantly better    Patient Goals/Self-Care  Activities Patient will:  - take medications as prescribed  Follow Up Plan: Face to Face appointment with care management team member scheduled for:  Sept 2023  Arizona Constable, Pharm.D. - [phone]        Medication Assistance:  -Starting process for PAP Lisabeth Pick  Compliance/Adherence/Medication fill history: Care Gaps: Last eye exam / Retinopathy Screening? 07-01-2020 Last Annual Wellness Visit? 04-24-2020 Last Diabetic Foot Exam? Patient states Dr. Henrene Pastor examines feet.   Star Rating Drugs: Farxiga 10 mg- Patient assistance Metformin 1000 mg- Last filled 05-27-2021 90 DS Atorvastatin 40 mg- Last filled 03-27-2021 90 DS Amlodipine-benazepril 10-20 mg- Last filled 04-17-2021 90 DS   Patient's preferred pharmacy is:  [facility] [address] [place] [address] [place] [address] Phone: [phone] Fax: [phone]  Uses pill box? No - Doesn't think he needs Pt endorses 100% compliance  Care Plan and Follow Up Patient Decision:  Patient agrees to Care Plan and Follow-up.  Plan: The patient has been provided with contact information for the care management team and has been advised to call with any health related questions or concerns.   [place] Constable, Pharm.D. - [phone]  F/U: Sept 2023

## 2021-08-14 ENCOUNTER — Other Ambulatory Visit: Payer: Self-pay | Admitting: Legal Medicine

## 2021-08-14 DIAGNOSIS — F419 Anxiety disorder, unspecified: Secondary | ICD-10-CM

## 2021-08-18 DIAGNOSIS — E1142 Type 2 diabetes mellitus with diabetic polyneuropathy: Secondary | ICD-10-CM

## 2021-08-18 DIAGNOSIS — E782 Mixed hyperlipidemia: Secondary | ICD-10-CM | POA: Diagnosis not present

## 2021-08-18 DIAGNOSIS — I10 Essential (primary) hypertension: Secondary | ICD-10-CM

## 2021-08-26 ENCOUNTER — Ambulatory Visit (INDEPENDENT_AMBULATORY_CARE_PROVIDER_SITE_OTHER): Payer: Medicare HMO

## 2021-08-26 ENCOUNTER — Ambulatory Visit: Payer: Medicare HMO | Admitting: Physician Assistant

## 2021-08-26 DIAGNOSIS — Z96651 Presence of right artificial knee joint: Secondary | ICD-10-CM | POA: Diagnosis not present

## 2021-08-26 NOTE — Progress Notes (Signed)
Post-Op Visit Note   Patient: Clayton James           Date of Birth: [birth year]           MRN: [id number] Visit Date: 08/26/2021 PCP: Lillard Anes, MD   Assessment & Plan:  Chief Complaint:  Chief Complaint  Patient presents with   Right Knee - Follow-up   Visit Diagnoses:  1. Status post total right knee replacement     Plan: Patient is a pleasant 71 year old gentleman who comes in today 3 months status post right total knee replacement 05/26/2021.  He has been doing well.  No complaints of pain.  He is back to doing activities he was not able to do before surgery.  Overall, feeling great.  Examination of the right knee reveals range of motion from 5 to 15 degrees.  He is stable to valgus varus stress.  He is neurovascular tact distally.  Today, dental prophylaxis was reinforced.  He may follow-up with [place] in 3 months time for repeat evaluation.  He does mention to me that he would like to possibly schedule surgery sometime in December or January and he will follow-up with [place] a few months prior to his desired date.  Call with concerns or questions in the meantime.  Follow-Up Instructions: Return in about 3 months (around 11/26/2021).   Orders:  Orders Placed This Encounter  Procedures   XR Knee 1-2 Views Right   No orders of the defined types were placed in this encounter.   Imaging: XR Knee 1-2 Views Right  Result Date: 08/26/2021 Well-seated prosthesis without complication   PMFS History: Patient Active Problem List   Diagnosis Date Noted   Primary osteoarthritis of right knee 05/26/2021   Status post total right knee replacement 05/26/2021   BPH (benign prostatic hyperplasia) 03/20/2021   Rotator cuff tear, right 12/02/2020   Pigmented nevus 04/18/2020   BMI 25.0-25.9,adult 10/04/2019   PVD (peripheral vascular disease) ([place]) 08/28/2019   Osteoarthritis of left knee 05/04/2019   Mixed hyperlipidemia 04/27/2019   Chronic kidney disease, stage 3a ([place])  04/27/2019   Type 2 diabetes mellitus with diabetic polyneuropathy ([place]) 04/27/2019   Type 2 diabetes mellitus with stage 3 chronic kidney disease ([place]) 04/27/2019   Benign essential hypertension 04/27/2019   Osteoarthritis of right knee 04/27/2019   Past Medical History:  Diagnosis Date   Arthritis    Benign essential hypertension 04/27/2019   Chronic kidney disease, stage 3a ([place]) 04/27/2019   Mixed hyperlipidemia 04/27/2019   Type 2 diabetes mellitus with diabetic polyneuropathy ([place]) 04/27/2019   Type 2 diabetes mellitus with stage 3 chronic kidney disease ([place]) 04/27/2019    Family History  Problem Relation Age of Onset   Diabetes Mother    Kidney disease Mother    Heart disease Father     Past Surgical History:  Procedure Laterality Date   ELBOW SURGERY Left 1994   KNEE ARTHROSCOPY Right 1998   KNEE CARTILAGE SURGERY Left 1994   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   Procedure: RIGHT TOTAL KNEE ARTHROPLASTY;  Surgeon: Leandrew Koyanagi, MD;  Location: [facility];  Service: Orthopedics;  Laterality: Right;   Social History   Occupational History   Not on file  Tobacco Use   Smoking status: Former    Years: 8.00    Types: Cigarettes    Quit date: 2000    Years since quitting: 23.6   Smokeless tobacco: Current    Types: Chew   Tobacco  comments:    1 can per week  Vaping Use   Vaping Use: Never used  Substance and Sexual Activity   Alcohol use: Yes    Alcohol/week: 7.0 standard drinks of alcohol    Types: 7 Cans of beer per week    Comment: one beer or one glass of wine per day   Drug use: Never   Sexual activity: Yes    Partners: Female

## 2021-09-03 ENCOUNTER — Telehealth: Payer: Self-pay

## 2021-09-03 NOTE — Progress Notes (Signed)
Care Gap(s) Not Met that Need to be Addressed:   Controlling High Blood Pressure  Eye Exam for Patients With Diabetes  Hemoglobin A1c Control for Patients With Diabetes   Action Taken: Updated care gap list with latest BP 07/08/21 120/60, A1C 07/08/21 6.8 and messaged PCP pt is due for a DM eye exam    Follow Up: 11/11/21 with Dr. Henrene Pastor. Fasting labs

## 2021-09-19 ENCOUNTER — Ambulatory Visit (INDEPENDENT_AMBULATORY_CARE_PROVIDER_SITE_OTHER): Payer: Medicare HMO

## 2021-09-19 DIAGNOSIS — E782 Mixed hyperlipidemia: Secondary | ICD-10-CM

## 2021-09-19 DIAGNOSIS — I10 Essential (primary) hypertension: Secondary | ICD-10-CM

## 2021-09-19 DIAGNOSIS — E1142 Type 2 diabetes mellitus with diabetic polyneuropathy: Secondary | ICD-10-CM

## 2021-09-19 NOTE — Patient Instructions (Signed)
Visit Information   Goals Addressed   None    Patient Care Plan: CCM Pharmacy Care Plan     Problem Identified: DM, HTN, Mental health, Lipids   Priority: High  Onset Date: 10/21/2020     Long-Range Goal: Disease State Management   Start Date: 10/21/2020  Expected End Date: 10/21/2021  Recent Progress: On track  Priority: High  Note:   Current Barriers:  Does not maintain contact with provider office Does not contact provider office for questions/concerns  Pharmacist Clinical Goal(s):  Patient will contact provider office for questions/concerns as evidenced notation of same in electronic health record through collaboration with PharmD and provider.   Interventions: 1:1 collaboration with Lillard Anes, MD regarding development and update of comprehensive plan of care as evidenced by provider attestation and co-signature Inter-disciplinary care team collaboration (see longitudinal plan of care) Comprehensive medication review performed; medication list updated in electronic medical record  Hypertension (BP goal <140/90) BP Readings from Last 3 Encounters:  07/08/21 120/60  05/27/21 140/82  05/20/21 (!) 167/93  -Controlled -Current treatment: Amlodipine/Benazapril 10/58m QD Appropriate, Effective, Safe, Accessible -Medications previously tried: N/A  -Current home readings: Doesn't test -Current dietary habits: "Tries to eat Healthy" -Current exercise habits: Does chores around the house -Denies hypotensive/hypertensive symptoms -Educated on BP goals and benefits of medications for prevention of heart attack, stroke and kidney damage; Extensive time spent on counseling patient on blood pressure goal and impact of each antihypertensive medication on their blood pressure and risk reduction for CV disease.  Used analogies to explain the need for multiple antihypertensive medications to achieve BP goals and that it is often a silent disease with no symptoms.  -Counseled  to monitor BP at home PRN, document, and provide log at future appointments -Recommended to continue current medication  Hyperlipidemia: (LDL goal < 100) Lab Results  Component Value Date   CHOL 146 07/08/2021   CHOL 198 04/03/2021   CHOL 193 12/02/2020   Lab Results  Component Value Date   HDL 62 07/08/2021   HDL 60 04/03/2021   HDL 65 12/02/2020   Lab Results  Component Value Date   LDLCALC 67 07/08/2021   LDLCALC 117 (H) 04/03/2021   LDLCALC 108 (H) 12/02/2020   Lab Results  Component Value Date   TRIG 88 07/08/2021   TRIG 117 04/03/2021   TRIG 113 12/02/2020   Lab Results  Component Value Date   CHOLHDL 2.4 07/08/2021   CHOLHDL 3.3 04/03/2021   CHOLHDL 3.0 12/02/2020  No results found for: "LDLDIRECT" -Controlled -Current treatment: Atorvastatin 460mAppropriate, Effective, Safe, Accessible -Medications previously tried: N/A  -Current dietary habits: "Tries to eat Healthy" -Current exercise habits: Does chores around the house -Educated on Cholesterol goals;  Over 15 minutes spent counseling patient on role of hyperlipidemia on heart disease and the impact of each lipid subclass with emphasis on LDL and heart disease risk.  Explained role of statins in prevention of CV disease complications and actual risk for adverse effects with statin treatment.  Counseled patient on genetic component placing them at risk for hyperlipidemia. -Recommended to continue current medication  Diabetes (A1c goal <7%) Lab Results  Component Value Date   HGBA1C 6.8 (H) 07/08/2021   HGBA1C 7.3 (H) 04/03/2021   HGBA1C 6.3 (H) 12/02/2020   Lab Results  Component Value Date   MICROALBUR 80 05/17/2019   LDLCALC 67 07/08/2021   CREATININE 0.74 (L) 07/08/2021   Lab Results  Component Value Date   NA 141  07/08/2021   K 4.2 07/08/2021   CREATININE 0.74 (L) 07/08/2021   EGFR 97 07/08/2021   GFRNONAA >60 05/20/2021   GLUCOSE 86 07/08/2021   Lab Results  Component Value Date   WBC  6.7 07/08/2021   HGB 14.4 07/08/2021   HCT 42.4 07/08/2021   MCV 90 07/08/2021   PLT 326 07/08/2021  -Controlled -Current medications: Farxiga 26m Appropriate, Effective, Safe, Query accessible Tresiba 30 units Appropriate, Effective, Safe, Query accessible Metformin 10012mAppropriate, Effective, Safe, Accessible -Medications previously tried: N/A  -Current home glucose readings fasting glucose: Didn't have logs on him post prandial glucose: Didn't have logs on him -Denies hypoglycemic/hyperglycemic symptoms -Current meal patterns: Patient didn't answer initial question and since his labs are good, I didn't repeat myself -Current exercise: Chores -Educated on A1c and blood sugar goals; Extensive time was spent counseling patient on the pathophysiology and risk for complications with uncontrolled Diabetes.  Used teach back method for counseling on ADA diabetic diet.  Time spent on explaining role of carbohydrates versus sugars impacting blood glucose levels and meal planning. Current exercise consists of walking 10 minutes three times a week, plan is to increase to 20 minutes 5 days a week and then after 2-3 weeks increase to 30 minute 5 days a week.  -Counseled to check feet daily and get yearly eye exams July 2023: PAP for Farxiga/Tresiba. Makes $45,000/year between him and wife August 2023: Patient found out he makes $65,000/year between him and his wife. He states he never got PAP paperwork in the mail but didn't think about calling. Will re-send out  Depression/Anxiety -Patient has lost 2 of his sons, said, "If I didn't take this medicine my mind would just race and I wouldn't be able to focus or sleep or anything" -Controlled -Current treatment: Alprazolam BID Appropriate, Effective, Safe, Accessible -Medications previously tried/failed: N/A -PHQ9:     04/03/2021    7:43 AM 06/20/2020    9:07 AM 04/24/2020    2:29 PM  Depression screen PHQ 2/9  Decreased Interest 0 0 0  Down,  Depressed, Hopeless 0 0 0  PHQ - 2 Score 0 0 0  -GAD7:      No data to display        -Educated on Benefits of medication for symptom control October 2022: Patient could try preventative therapy (SSRI/SNRI/Etc) so he doesn't have to take BNZ as often, patient wasn't open to changing any meds today. July 2023: Same as before  Pain Controlled Pain Scale: Without Meds  -October 2022: 8/10 With Meds: -October 2022: 3-4/10 (Is able to do chores, just is slower) -Current medications: Diclofenac Appropriate, Effective, Safe, Accessible Oxycodone/APAP 5/325 Appropriate, Effective, Safe, Accessible -Medications previously tried: N/A July 2023: Patient's priority was cost of meds. Unable to do pain scale but he did say that post knee Sx in May, he feels significantly better    Patient Goals/Self-Care Activities Patient will:  - take medications as prescribed  Follow Up Plan: Face to Face appointment with care management team member scheduled for:  Oct 2023  [facility] - - [phone]     Mr. KeDiehlas given information about Chronic Care Management services today including:  CCM service includes personalized support from designated clinical staff supervised by his physician, including individualized plan of care and coordination with other care providers 24/7 contact phone numbers for assistance for urgent and routine care needs. Standard insurance, coinsurance, copays and deductibles apply for chronic care management only during months in  which we provide at least 20 minutes of these services. Most insurances cover these services at 100%, however patients may be responsible for any copay, coinsurance and/or deductible if applicable. This service may help you avoid the need for more expensive face-to-face services. Only one practitioner may furnish and bill the service in a calendar month. The patient may stop CCM services at any time (effective at the end of the  month) by phone call to the office staff.  Patient agreed to services and verbal consent obtained.   The patient verbalized understanding of instructions, educational materials, and care plan provided today and DECLINED offer to receive copy of patient instructions, educational materials, and care plan.  The pharmacy team will reach out to the patient again over the next 60 days.   Lane Hacker, [place]

## 2021-09-19 NOTE — Progress Notes (Signed)
Chronic Care Management Pharmacy Note  09/19/2021 Name:  Clayton James MRN:  [id number] DOB:  [birth year]  Summary: -Pleasant 71 year old male presents for initial CCM visit. 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 While doing this, 2 of his sons died and he decided to retire. 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  -He hunts deer every year. He's excited for the new season, he does both bow and rifle and is teaching his grandson how  Recommendations/Changes made from today's visit: -PAP for Tresiba/Farxiga    Subjective: Clayton James is an 71 y.o. year old male who is a primary patient of Henrene Pastor, Zeb Comfort, MD.  The CCM team was consulted for assistance with disease management and care coordination needs.    Engaged with patient by telephone for follow up visit in response to provider referral for pharmacy case management and/or care coordination services.   Consent to Services:  The patient was given the following information about Chronic Care Management services today, agreed to services, and gave verbal consent: 1. CCM service includes personalized support from designated clinical staff supervised by the primary care provider, including individualized plan of care and coordination with other care providers 2. 24/7 contact phone numbers for assistance for urgent and routine care needs. 3. Service will only be billed when office clinical staff spend 20 minutes or more in a month to coordinate care. 4. Only one practitioner may furnish and bill the service in a calendar month. 5.The patient may stop CCM services at any time (effective at the end of the month) by phone call to the office staff. 6. The patient will be responsible for cost sharing (co-pay) of up to 20% of the service  fee (after annual deductible is met). Patient agreed to services and consent obtained.  Patient Care Team: Lillard Anes, MD as PCP - General (Family Medicine) Lane Hacker, [facility] (Pharmacist)  Recent office visits:  04-03-2021 Lillard Anes, MD. Albumin/Globulin= 2.3. A1C= 7.3. LDL= 117   Recent consult visits:  05-26-2021 Leandrew Koyanagi, MD (Neurosurgery). Right total knee arthroplasty procedure completed.    05-20-2021 Pre-op testing   Hospital visits:  None in previous 6 months   Objective:  Lab Results  Component Value Date   CREATININE 0.74 (L) 07/08/2021   BUN 14 07/08/2021   GFRNONAA >60 05/20/2021   GFRAA 109 02/15/2020   NA 141 07/08/2021   K 4.2 07/08/2021   CALCIUM 9.7 07/08/2021   CO2 21 07/08/2021   GLUCOSE 86 07/08/2021    Lab Results  Component Value Date/Time   HGBA1C 6.8 (H) 07/08/2021 02:18 PM   HGBA1C 7.3 (H) 04/03/2021 08:23 AM   MICROALBUR 80 05/17/2019 09:03 AM    Last diabetic Eye exam:  Lab Results  Component Value Date/Time   HMDIABEYEEXA No Retinopathy 07/04/2020 12:00 AM    Last diabetic Foot exam: No results found for: "HMDIABFOOTEX"   Lab Results  Component Value Date   CHOL 146 07/08/2021   HDL 62 07/08/2021   LDLCALC 67 07/08/2021   TRIG 88 07/08/2021   CHOLHDL 2.4 07/08/2021       Latest Ref Rng & Units 07/08/2021    2:18 PM 05/20/2021   11:21 AM 04/03/2021    8:23 AM  Hepatic Function  Total Protein 6.0 - 8.5 g/dL 6.8  7.1  6.2   Albumin 3.7 - 4.7 g/dL 4.5  4.2  4.3   AST 0 - 40 IU/L 14  19  21    ALT 0 - 44 IU/L 13  24  23    Alk Phosphatase 44 - 121 IU/L 82  75  79   Total Bilirubin 0.0 - 1.2 mg/dL 0.8  0.7  0.5     No results found for: "TSH", "FREET4"     Latest Ref Rng & Units 07/08/2021    2:18 PM 05/27/2021    5:59 AM 05/20/2021   11:21 AM  CBC  WBC 3.4 - 10.8 x10E3/uL 6.7  10.1  9.7   Hemoglobin 13.0 - 17.7 g/dL 14.4  13.6  16.6   Hematocrit 37.5 - 51.0 % 42.4  40.4  48.4   Platelets 150  - 450 x10E3/uL 326  228  307     No results found for: "VD25OH"  Clinical ASCVD: Yes  The 10-year ASCVD risk score (Arnett DK, et al., 2019) is: 28.5%   Values used to calculate the score:     Age: 71 years     Sex: Male     Is Non-Hispanic African American: No     Diabetic: Yes     Tobacco smoker: No     Systolic Blood Pressure: 423 mmHg     Is BP treated: Yes     HDL Cholesterol: 62 mg/dL     Total Cholesterol: 146 mg/dL       04/03/2021    7:43 AM 06/20/2020    9:07 AM 04/24/2020    2:29 PM  Depression screen PHQ 2/9  Decreased Interest 0 0 0  Down, Depressed, Hopeless 0 0 0  PHQ - 2 Score 0 0 0     Other: (CHADS2VASc if Afib, MMRC or CAT for COPD, ACT, DEXA)  Social History   Tobacco Use  Smoking Status Former   Years: 8.00   Types: Cigarettes   Quit date: 2000   Years since quitting: 23.6  Smokeless Tobacco Current   Types: Chew  Tobacco Comments   1 can per week   BP Readings from Last 3 Encounters:  07/08/21 120/60  05/27/21 140/82  05/20/21 (!) 167/93   Pulse Readings from Last 3 Encounters:  07/08/21 87  05/27/21 75  05/20/21 (!) 101   Wt Readings from Last 3 Encounters:  07/08/21 185 lb (83.9 kg)  05/26/21 190 lb (86.2 kg)  05/20/21 192 lb 14.4 oz (87.5 kg)   BMI Readings from Last 3 Encounters:  07/08/21 25.80 kg/m  05/26/21 26.50 kg/m  05/20/21 26.90 kg/m    Assessment/Interventions: Review of patient past medical history, allergies, medications, health status, including review of consultants reports, laboratory and other test data, was performed as part of comprehensive evaluation and provision of chronic care management services.   SDOH:  (Social Determinants of Health) assessments and interventions performed: Yes SDOH Interventions    Flowsheet Row Most Recent Value  SDOH Interventions   Financial Strain Interventions Other (Comment)  [PAP]  Transportation Interventions Intervention Not Indicated      SDOH Screenings    Alcohol Screen: Low Risk  (06/20/2020)   Alcohol Screen    Last Alcohol Screening Score (AUDIT): 1  Depression (PHQ2-9): Low Risk  (04/03/2021)   Depression (PHQ2-9)    PHQ-2 Score: 0  Financial Resource Strain: High Risk (09/19/2021)   Overall Financial Resource Strain (CARDIA)  Difficulty of Paying Living Expenses: Hard  Food Insecurity: No Food Insecurity (04/24/2020)   Hunger Vital Sign    Worried About Running Out of Food in the Last Year: Never true    Ran Out of Food in the Last Year: Never true  Housing: Low Risk  (04/24/2020)   Housing    Last Housing Risk Score: 0  Physical Activity: Not on file  Social Connections: Not on file  Stress: Not on file  Tobacco Use: High Risk (07/08/2021)   Patient History    Smoking Tobacco Use: Former    Smokeless Tobacco Use: Current    Passive Exposure: Not on file  Transportation Needs: No Transportation Needs (09/19/2021)   PRAPARE - Hydrologist (Medical): No    Lack of Transportation (Non-Medical): No    CCM Care Plan  Allergies  Allergen Reactions   Sulfamethoxazole Rash    Medications Reviewed Today     Reviewed by Lane Hacker, [facility] (Pharmacist) on 09/19/21 at 1026  Med List Status: <None>   Medication Order Taking? Sig Documenting Provider Last Dose Status Informant  ALPRAZolam (XANAX) 0.5 MG tablet [id number]  Take 1 tablet by mouth twice daily as needed Lillard Anes, MD  Active   amLODipine-benazepril (LOTREL) 10-20 MG capsule [id number] No Take 1 capsule by mouth once daily Lillard Anes, MD Taking Active Self  aspirin EC 81 MG tablet [id number] No Take 1 tablet (81 mg total) by mouth 2 (two) times daily. To be taken after surgery to prevent blood clots Aundra Dubin, PA-C Taking Active   atorvastatin (LIPITOR) 40 MG tablet [id number] No Take 1 tablet (40 mg total) by mouth daily. Lillard Anes, MD Taking Active Self  Blood Glucose Monitoring Suppl (ONE TOUCH ULTRA  2) w/Device Drucie Opitz [id number] No USE TO CHECK GLUCOSE THREE TIMES DAILY [provider] Taking Active Self  dapagliflozin propanediol (FARXIGA) 10 MG TABS tablet [id number]  Take 1 tablet (10 mg total) by mouth daily. Lillard Anes, MD  Active   diclofenac Sodium (VOLTAREN) 1 % GEL [id number] No Apply 2 g topically daily as needed (pain). [provider] Taking Active Self  docusate sodium (COLACE) 100 MG capsule [id number] No Take 1 capsule (100 mg total) by mouth daily as needed. Aundra Dubin, PA-C Taking Active   fluticasone (FLONASE) 50 MCG/ACT nasal spray [id number] No Place 2 sprays into both nostrils daily as needed for allergies or rhinitis. [provider] Taking Active Self  Insulin Pen Needle (BD PEN NEEDLE MICRO U/F) 32G X 6 MM MISC [id number] No USE AS DIRECTED ONCE DAILY Lillard Anes, MD Taking Active Self  metFORMIN (GLUCOPHAGE) 1000 MG tablet [id number] No Take 1 tablet by mouth twice daily Lillard Anes, MD Taking Active Self  methocarbamol (ROBAXIN-750) 750 MG tablet [id number] No Take 1 tablet (750 mg total) by mouth 2 (two) times daily as needed for muscle spasms. Aundra Dubin, PA-C Taking Active   ondansetron (ZOFRAN) 4 MG tablet [id number] No Take 1 tablet (4 mg total) by mouth every 8 (eight) hours as needed for nausea or vomiting. Aundra Dubin, PA-C Taking Active   oxyCODONE-acetaminophen (PERCOCET) 5-325 MG tablet [id number] No Take 1-2 tablets by mouth 2 (two) times daily as needed. Aundra Dubin, PA-C Taking Active   sildenafil (VIAGRA) 100 MG tablet [id number] No Take 1 tablet (100 mg total) by mouth daily as needed for erectile dysfunction. Lillard Anes, MD Taking Active Self  tamsulosin (FLOMAX) 0.4 MG CAPS capsule [id number] No Take 1 capsule (0.4 mg total) by mouth daily. Lillard Anes, MD Taking Active   TRESIBA FLEXTOUCH 100 UNIT/ML FlexTouch Pen [id number] No INJECT 15 UNITS SUBCUTANEOUSLY ONCE  DAILY Lillard Anes, MD Taking Active Self  Med List Note Erie Noe, LPN 38/25/05 3976): Patient approved for [place] Patient Assistance from [place] 06/23/20 through 01/18/21            Patient Active Problem List   Diagnosis Date Noted   Primary osteoarthritis of right knee 05/26/2021   Status post total right knee replacement 05/26/2021   BPH (benign prostatic hyperplasia) 03/20/2021   Rotator cuff tear, right 12/02/2020   Pigmented nevus 04/18/2020   BMI 25.0-25.9,adult 10/04/2019   PVD (peripheral vascular disease) ([place]) 08/28/2019   Osteoarthritis of left knee 05/04/2019   Mixed hyperlipidemia 04/27/2019   Chronic kidney disease, stage 3a ([place]) 04/27/2019   Type 2 diabetes mellitus with diabetic polyneuropathy ([place]) 04/27/2019   Type 2 diabetes mellitus with stage 3 chronic kidney disease ([place]) 04/27/2019   Benign essential hypertension 04/27/2019   Osteoarthritis of right knee 04/27/2019    Immunization History  Administered Date(s) Administered   Fluad Quad(high Dose 65+) 12/02/2020   Influenza-Unspecified 11/16/2018   PFIZER Comirnaty([place])Covid-19 Tri-Sucrose Vaccine 03/30/2019, 04/25/2019   Pneumococcal Polysaccharide-23 04/24/2020    Conditions to be addressed/monitored:  Hypertension, Hyperlipidemia, Diabetes, and Anxiety  Care Plan : [place]  Updates made by Lane Hacker, RPH since 09/19/2021 12:00 AM     Problem: DM, HTN, Mental health, Lipids   Priority: High  Onset Date: 10/21/2020     Long-Range Goal: Disease State Management   Start Date: 10/21/2020  Expected End Date: 10/21/2021  Recent Progress: On track  Priority: High  Note:   Current Barriers:  Does not maintain contact with provider office Does not contact provider office for questions/concerns  Pharmacist Clinical Goal(s):  Patient will contact provider office for questions/concerns as evidenced notation of same in electronic health record through  collaboration with PharmD and provider.   Interventions: 1:1 collaboration with Lillard Anes, MD regarding development and update of comprehensive plan of care as evidenced by provider attestation and co-signature Inter-disciplinary care team collaboration (see longitudinal plan of care) Comprehensive medication review performed; medication list updated in electronic medical record  Hypertension (BP goal <140/90) BP Readings from Last 3 Encounters:  07/08/21 120/60  05/27/21 140/82  05/20/21 (!) 167/93  -Controlled -Current treatment: Amlodipine/Benazapril 10/82m QD Appropriate, Effective, Safe, Accessible -Medications previously tried: N/A  -Current home readings: Doesn't test -Current dietary habits: "Tries to eat Healthy" -Current exercise habits: Does chores around the house -Denies hypotensive/hypertensive symptoms -Educated on BP goals and benefits of medications for prevention of heart attack, stroke and kidney damage; Extensive time spent on counseling patient on blood pressure goal and impact of each antihypertensive medication on their blood pressure and risk reduction for CV disease.  Used analogies to explain the need for multiple antihypertensive medications to achieve BP goals and that it is often a silent disease with no symptoms.  -Counseled to monitor BP at home PRN, document, and provide log at future appointments -Recommended to continue current medication  Hyperlipidemia: (LDL goal < 100) Lab Results  Component Value Date   CHOL 146 07/08/2021   CHOL 198 04/03/2021   CHOL 193 12/02/2020   Lab Results  Component Value Date   HDL 62 07/08/2021   HDL 60 04/03/2021   HDL  65 12/02/2020   Lab Results  Component Value Date   LDLCALC 67 07/08/2021   LDLCALC 117 (H) 04/03/2021   LDLCALC 108 (H) 12/02/2020   Lab Results  Component Value Date   TRIG 88 07/08/2021   TRIG 117 04/03/2021   TRIG 113 12/02/2020   Lab Results  Component Value Date    CHOLHDL 2.4 07/08/2021   CHOLHDL 3.3 04/03/2021   CHOLHDL 3.0 12/02/2020  No results found for: "LDLDIRECT" -Controlled -Current treatment: Atorvastatin 38m Appropriate, Effective, Safe, Accessible -Medications previously tried: N/A  -Current dietary habits: "Tries to eat Healthy" -Current exercise habits: Does chores around the house -Educated on Cholesterol goals;  Over 15 minutes spent counseling patient on role of hyperlipidemia on heart disease and the impact of each lipid subclass with emphasis on LDL and heart disease risk.  Explained role of statins in prevention of CV disease complications and actual risk for adverse effects with statin treatment.  Counseled patient on genetic component placing them at risk for hyperlipidemia. -Recommended to continue current medication  Diabetes (A1c goal <7%) Lab Results  Component Value Date   HGBA1C 6.8 (H) 07/08/2021   HGBA1C 7.3 (H) 04/03/2021   HGBA1C 6.3 (H) 12/02/2020   Lab Results  Component Value Date   MICROALBUR 80 05/17/2019   LDLCALC 67 07/08/2021   CREATININE 0.74 (L) 07/08/2021   Lab Results  Component Value Date   NA 141 07/08/2021   K 4.2 07/08/2021   CREATININE 0.74 (L) 07/08/2021   EGFR 97 07/08/2021   GFRNONAA >60 05/20/2021   GLUCOSE 86 07/08/2021   Lab Results  Component Value Date   WBC 6.7 07/08/2021   HGB 14.4 07/08/2021   HCT 42.4 07/08/2021   MCV 90 07/08/2021   PLT 326 07/08/2021  -Controlled -Current medications: Farxiga 159mAppropriate, Effective, Safe, Query accessible Tresiba 30 units Appropriate, Effective, Safe, Query accessible Metformin 100065mppropriate, Effective, Safe, Accessible -Medications previously tried: N/A  -Current home glucose readings fasting glucose: Didn't have logs on him post prandial glucose: Didn't have logs on him -Denies hypoglycemic/hyperglycemic symptoms -Current meal patterns: Patient didn't answer initial question and since his labs are good, I didn't  repeat myself -Current exercise: Chores -Educated on A1c and blood sugar goals; Extensive time was spent counseling patient on the pathophysiology and risk for complications with uncontrolled Diabetes.  Used teach back method for counseling on ADA diabetic diet.  Time spent on explaining role of carbohydrates versus sugars impacting blood glucose levels and meal planning. Current exercise consists of walking 10 minutes three times a week, plan is to increase to 20 minutes 5 days a week and then after 2-3 weeks increase to 30 minute 5 days a week.  -Counseled to check feet daily and get yearly eye exams July 2023: PAP for Farxiga/Tresiba. Makes $45,000/year between him and wife August 2023: Patient found out he makes $65,000/year between him and his wife. He states he never got PAP paperwork in the mail but didn't think about calling. Will re-send out  Depression/Anxiety -Patient has lost 2 of his sons, said, "If I didn't take this medicine my mind would just race and I wouldn't be able to focus or sleep or anything" -Controlled -Current treatment: Alprazolam BID Appropriate, Effective, Safe, Accessible -Medications previously tried/failed: N/A -PHQ9:     04/03/2021    7:43 AM 06/20/2020    9:07 AM 04/24/2020    2:29 PM  Depression screen PHQ 2/9  Decreased Interest 0 0 0  Down, Depressed, Hopeless  0 0 0  PHQ - 2 Score 0 0 0  -GAD7:      No data to display        -Educated on Benefits of medication for symptom control October 2022: Patient could try preventative therapy (SSRI/SNRI/Etc) so he doesn't have to take BNZ as often, patient wasn't open to changing any meds today. July 2023: Same as before  Pain Controlled Pain Scale: Without Meds  -October 2022: 8/10 With Meds: -October 2022: 3-4/10 (Is able to do chores, just is slower) -Current medications: Diclofenac Appropriate, Effective, Safe, Accessible Oxycodone/APAP 5/325 Appropriate, Effective, Safe, Accessible -Medications  previously tried: N/A July 2023: Patient's priority was cost of meds. Unable to do pain scale but he did say that post knee Sx in May, he feels significantly better    Patient Goals/Self-Care Activities Patient will:  - take medications as prescribed  Follow Up Plan: Face to Face appointment with care management team member scheduled for:  Oct 2023  [place] Constable, Pharm.D. - [phone]        Medication Assistance:  -Starting process for PAP Lisabeth Pick  Compliance/Adherence/Medication fill history: Care Gaps: Last eye exam / Retinopathy Screening? 07-01-2020 Last Annual Wellness Visit? 04-24-2020 Last Diabetic Foot Exam? Patient states Dr. Henrene Pastor examines feet.   Star Rating Drugs: Farxiga 10 mg- Patient assistance Metformin 1000 mg- Last filled 05-27-2021 90 DS Atorvastatin 40 mg- Last filled 03-27-2021 90 DS Amlodipine-benazepril 10-20 mg- Last filled 04-17-2021 90 DS   Patient's preferred pharmacy is:  [facility] [address] [place] [address] [place] [address] Phone: [phone] Fax: [phone]  Uses pill box? No - Doesn't think he needs Pt endorses 100% compliance  Care Plan and Follow Up Patient Decision:  Patient agrees to Care Plan and Follow-up.  Plan: The patient has been provided with contact information for the care management team and has been advised to call with any health related questions or concerns.   [place] Constable, Pharm.D. - [phone]  F/U: Sept 2023

## 2021-09-23 ENCOUNTER — Telehealth: Payer: Self-pay

## 2021-09-23 NOTE — Chronic Care Management (AMB) (Signed)
09-23-2021: Completed/uploaded patient assistance applications for farxiga and tresiba.  [place] Pharmacist Assistant [phone]

## 2021-10-18 DIAGNOSIS — E1159 Type 2 diabetes mellitus with other circulatory complications: Secondary | ICD-10-CM | POA: Diagnosis not present

## 2021-10-18 DIAGNOSIS — R69 Illness, unspecified: Secondary | ICD-10-CM | POA: Diagnosis not present

## 2021-10-18 DIAGNOSIS — E785 Hyperlipidemia, unspecified: Secondary | ICD-10-CM | POA: Diagnosis not present

## 2021-10-18 DIAGNOSIS — F32A Depression, unspecified: Secondary | ICD-10-CM | POA: Diagnosis not present

## 2021-10-18 DIAGNOSIS — Z794 Long term (current) use of insulin: Secondary | ICD-10-CM

## 2021-10-18 DIAGNOSIS — I10 Essential (primary) hypertension: Secondary | ICD-10-CM | POA: Diagnosis not present

## 2021-10-20 ENCOUNTER — Other Ambulatory Visit: Payer: Self-pay | Admitting: Legal Medicine

## 2021-10-20 DIAGNOSIS — E1142 Type 2 diabetes mellitus with diabetic polyneuropathy: Secondary | ICD-10-CM

## 2021-11-02 ENCOUNTER — Other Ambulatory Visit: Payer: Self-pay | Admitting: Legal Medicine

## 2021-11-02 DIAGNOSIS — I10 Essential (primary) hypertension: Secondary | ICD-10-CM

## 2021-11-05 ENCOUNTER — Ambulatory Visit (INDEPENDENT_AMBULATORY_CARE_PROVIDER_SITE_OTHER): Payer: Medicare HMO | Admitting: Legal Medicine

## 2021-11-05 ENCOUNTER — Telehealth: Payer: Medicare HMO

## 2021-11-05 ENCOUNTER — Telehealth: Payer: Self-pay

## 2021-11-05 ENCOUNTER — Encounter: Payer: Self-pay | Admitting: Legal Medicine

## 2021-11-05 VITALS — BP 150/90 | HR 71 | Temp 97.8°F | Resp 14 | Ht 71.0 in | Wt 192.0 lb

## 2021-11-05 DIAGNOSIS — Z1159 Encounter for screening for other viral diseases: Secondary | ICD-10-CM

## 2021-11-05 DIAGNOSIS — Z23 Encounter for immunization: Secondary | ICD-10-CM | POA: Diagnosis not present

## 2021-11-05 DIAGNOSIS — E1142 Type 2 diabetes mellitus with diabetic polyneuropathy: Secondary | ICD-10-CM | POA: Diagnosis not present

## 2021-11-05 DIAGNOSIS — N1831 Chronic kidney disease, stage 3a: Secondary | ICD-10-CM | POA: Diagnosis not present

## 2021-11-05 DIAGNOSIS — M1712 Unilateral primary osteoarthritis, left knee: Secondary | ICD-10-CM

## 2021-11-05 DIAGNOSIS — I739 Peripheral vascular disease, unspecified: Secondary | ICD-10-CM | POA: Diagnosis not present

## 2021-11-05 DIAGNOSIS — R350 Frequency of micturition: Secondary | ICD-10-CM

## 2021-11-05 DIAGNOSIS — E1122 Type 2 diabetes mellitus with diabetic chronic kidney disease: Secondary | ICD-10-CM

## 2021-11-05 DIAGNOSIS — I10 Essential (primary) hypertension: Secondary | ICD-10-CM

## 2021-11-05 DIAGNOSIS — E782 Mixed hyperlipidemia: Secondary | ICD-10-CM | POA: Diagnosis not present

## 2021-11-05 DIAGNOSIS — N401 Enlarged prostate with lower urinary tract symptoms: Secondary | ICD-10-CM

## 2021-11-05 DIAGNOSIS — Z6825 Body mass index (BMI) 25.0-25.9, adult: Secondary | ICD-10-CM

## 2021-11-05 MED ORDER — HYDROCODONE-ACETAMINOPHEN 10-325 MG PO TABS: 1.0000 | ORAL_TABLET | Freq: Three times a day (TID) | ORAL | 0 refills | Status: DC | PRN

## 2021-11-05 NOTE — Telephone Encounter (Signed)
  Care Management   Follow Up Note   11/05/2021 Name: Clayton James MRN: [id number] DOB: 10-09-1950   Referred by: Lillard Anes, MD Reason for referral : Chronic Care Management   Patient had PCP appt scheduled for today and went to that instead  Follow Up Plan: The patient has been provided with contact information for the care management team and has been advised to call with any health related questions or concerns.   [place] Constable, Pharm.D. - [phone]

## 2021-11-05 NOTE — Progress Notes (Unsigned)
Subjective:  Patient ID: Clayton James, male    DOB: [birth year]  Age: 71 y.o. MRN: [id number]  Chief Complaint  Patient presents with   Hyperlipidemia   Diabetes   Hypertension    HPI: chronic Patient present with type 2 diabetes.  Compliance with treatment has been good; patient take medicines as directed, maintains diet and exercise regimen, follows up as directed, and is keeping glucose diary.  Current medicines for diabetes Metformin 1000 mg BID, Farxiga 10 mg daily, Tresiba  30 Units daily.   Patient performs foot exams daily and last ophthalmologic exam was few years ago. He checks his blood sugar one time a week.last A1c 6.8  Patient presents for follow up of hypertension.  Patient tolerating Amlodipine-Benazepril 10-20 mg daily, aspirin 81 mg daily.    well without side effects.  Patient is working on maintaining diet and exercise regimen and follows up as directed. Complication include Diabetes.  Patient presents with hyperlipidemia.  Compliance with treatment has been good; patient takes medicines as directed, maintains low cholesterol diet, follows up as directed, and maintains exercise regimen.  Patient is using Atorvastatin 40 mg daily without problems.    Current Outpatient Medications on File Prior to Visit  Medication Sig Dispense Refill   amLODipine-benazepril (LOTREL) 10-20 MG capsule Take 1 capsule by mouth once daily 90 capsule 0   atorvastatin (LIPITOR) 40 MG tablet Take 1 tablet (40 mg total) by mouth daily. 90 tablet 2   Blood Glucose Monitoring Suppl (ONE TOUCH ULTRA 2) w/Device KIT USE TO CHECK GLUCOSE THREE TIMES DAILY     dapagliflozin propanediol (FARXIGA) 10 MG TABS tablet Take 1 tablet (10 mg total) by mouth daily. 90 tablet 2   diclofenac Sodium (VOLTAREN) 1 % GEL Apply 2 g topically daily as needed (pain).     Insulin Pen Needle (BD PEN NEEDLE MICRO U/F) 32G X 6 MM MISC USE AS DIRECTED ONCE DAILY 100 each 4   metFORMIN (GLUCOPHAGE) 1000 MG tablet Take 1  tablet by mouth twice daily 180 tablet 2   ALPRAZolam (XANAX) 0.5 MG tablet Take 1 tablet by mouth twice daily as needed 60 tablet 3   aspirin EC 81 MG tablet Take 1 tablet (81 mg total) by mouth 2 (two) times daily. To be taken after surgery to prevent blood clots 84 tablet 0   methocarbamol (ROBAXIN-750) 750 MG tablet Take 1 tablet (750 mg total) by mouth 2 (two) times daily as needed for muscle spasms. (Patient not taking: Reported on 11/05/2021) 20 tablet 2   tamsulosin (FLOMAX) 0.4 MG CAPS capsule Take 1 capsule (0.4 mg total) by mouth daily. (Patient not taking: Reported on 11/05/2021) 90 capsule 3   TRESIBA FLEXTOUCH 100 UNIT/ML FlexTouch Pen INJECT 30 UNITS SUBCUTANEOUSLY ONCE DAILY 15 mL 3   No current facility-administered medications on file prior to visit.   Past Medical History:  Diagnosis Date   Arthritis    Benign essential hypertension 04/27/2019   Chronic kidney disease, stage 3a ([place]) 04/27/2019   Mixed hyperlipidemia 04/27/2019   Type 2 diabetes mellitus with diabetic polyneuropathy ([place]) 04/27/2019   Type 2 diabetes mellitus with stage 3 chronic kidney disease ([place]) 04/27/2019   Past Surgical History:  Procedure Laterality Date   ELBOW SURGERY Left 1994   KNEE ARTHROSCOPY Right 1998   KNEE CARTILAGE SURGERY Left 1994   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   Procedure: RIGHT TOTAL KNEE ARTHROPLASTY;  Surgeon: Leandrew Koyanagi, MD;  Location: [facility];  Service: Orthopedics;  Laterality: Right;    Family History  Problem Relation Age of Onset   Diabetes Mother    Kidney disease Mother    Heart disease Father    Social History   Socioeconomic History   Marital status: Married    Spouse name: Not on file   Number of children: 3   Years of education: Not on file   Highest education level: Not on file  Occupational History   Not on file  Tobacco Use   Smoking status: Former    Packs/day: 0.50    Years: 8.00    Total pack years: 4.00    Types: Cigarettes     Quit date: 2000    Years since quitting: 23.8   Smokeless tobacco: Current    Types: Chew   Tobacco comments:    1 can per week  Vaping Use   Vaping Use: Never used  Substance and Sexual Activity   Alcohol use: Not Currently   Drug use: Never   Sexual activity: Yes    Partners: Female  Other Topics Concern   Not on file  Social History Narrative   Oldest son and youngest son passed away   Social Determinants of Health   Financial Resource Strain: High Risk (09/19/2021)   Overall Financial Resource Strain (CARDIA)    Difficulty of Paying Living Expenses: Hard  Food Insecurity: No Food Insecurity (04/24/2020)   Hunger Vital Sign    Worried About Running Out of Food in the Last Year: Never true    Ran Out of Food in the Last Year: Never true  Transportation Needs: No Transportation Needs (09/19/2021)   PRAPARE - Hydrologist (Medical): No    Lack of Transportation (Non-Medical): No  Physical Activity: Not on file  Stress: Not on file  Social Connections: Not on file    Review of Systems  Constitutional:  Negative for chills, fatigue, fever and unexpected weight change.  HENT:  Positive for congestion. Negative for ear pain, sinus pain and sore throat.   Eyes:  Negative for visual disturbance.  Respiratory:  Negative for cough and shortness of breath.   Cardiovascular:  Negative for chest pain and palpitations.  Gastrointestinal:  Negative for abdominal pain, blood in stool, constipation, diarrhea, nausea and vomiting.  Endocrine: Negative for polydipsia.  Genitourinary:  Negative for dysuria.  Musculoskeletal:  Negative for back pain.  Skin:  Negative for rash.  Neurological:  Negative for headaches.  Psychiatric/Behavioral: Negative.       Objective:  BP (!) 150/90   Pulse 71   Temp 97.8 F (36.6 C)   Resp 14   Ht 5' 11"  (1.803 m)   Wt 192 lb (87.1 kg)   SpO2 96%   BMI 26.78 kg/m      11/05/2021   10:03 AM 07/08/2021    1:30 PM  05/27/2021    8:48 AM  BP/Weight  Systolic BP 017 494 496  Diastolic BP 90 60 82  Wt. (Lbs) 192 185   BMI 26.78 kg/m2 25.8 kg/m2     Physical Exam Vitals reviewed.  Constitutional:      General: He is not in acute distress.    Appearance: Normal appearance.  HENT:     Head: Normocephalic and atraumatic.     Right Ear: Tympanic membrane normal.     Left Ear: Tympanic membrane normal.     Nose: Nose normal.     Mouth/Throat:  Mouth: Mucous membranes are moist.     Pharynx: Oropharynx is clear.  Eyes:     Extraocular Movements: Extraocular movements intact.     Conjunctiva/sclera: Conjunctivae normal.     Pupils: Pupils are equal, round, and reactive to light.  Cardiovascular:     Rate and Rhythm: Normal rate and regular rhythm.     Pulses: Normal pulses.     Heart sounds: Normal heart sounds. No murmur heard.    No gallop.  Pulmonary:     Effort: Pulmonary effort is normal. No respiratory distress.     Breath sounds: Normal breath sounds. No wheezing.  Abdominal:     General: Abdomen is flat. Bowel sounds are normal. There is no distension.     Palpations: Abdomen is soft.     Tenderness: There is no abdominal tenderness.  Musculoskeletal:        General: Normal range of motion.     Cervical back: Normal range of motion.     Right lower leg: No edema.     Left lower leg: No edema.  Skin:    General: Skin is warm.     Capillary Refill: Capillary refill takes less than 2 seconds.  Neurological:     General: No focal deficit present.     Mental Status: He is alert and oriented to person, place, and time.     Sensory: Sensory deficit present.     Gait: Gait normal.     Deep Tendon Reflexes: Reflexes normal.  Psychiatric:        Mood and Affect: Mood normal.     Diabetic Foot Exam - Simple   Simple Foot Form Diabetic Foot exam was performed with the following findings: Yes 11/05/2021 10:30 AM  Visual Inspection No deformities, no ulcerations, no other skin  breakdown bilaterally: Yes Sensation Testing See comments: Yes Pulse Check Posterior Tibialis and Dorsalis pulse intact bilaterally: Yes Comments Numbness feet      Lab Results  Component Value Date   WBC 8.4 11/05/2021   HGB 15.7 11/05/2021   HCT 45.9 11/05/2021   PLT 304 11/05/2021   GLUCOSE 101 (H) 11/05/2021   CHOL 157 11/05/2021   TRIG 86 11/05/2021   HDL 52 11/05/2021   LDLCALC 89 11/05/2021   ALT 15 11/05/2021   AST 12 11/05/2021   NA 141 11/05/2021   K 4.5 11/05/2021   CL 103 11/05/2021   CREATININE 0.83 11/05/2021   BUN 17 11/05/2021   CO2 20 11/05/2021   HGBA1C 7.7 (H) 11/05/2021   MICROALBUR 80 05/17/2019      Assessment & Plan:   Problem List Items Addressed This Visit       Cardiovascular and Mediastinum   Benign essential hypertension   Relevant Orders   Comprehensive metabolic panel (Completed)   CBC with Differential/Platelet (Completed) An individual hypertension care plan was established and reinforced today.  The patient's status was assessed using clinical findings on exam and labs or diagnostic tests. The patient's success at meeting treatment goals on disease specific evidence-based guidelines and found to be well controlled. SELF MANAGEMENT: The patient and I together assessed ways to personally work towards obtaining the recommended goals. RECOMMENDATIONS: avoid decongestants found in common cold remedies, decrease consumption of alcohol, perform routine monitoring of BP with home BP cuff, exercise, reduction of dietary salt, take medicines as prescribed, try not to miss doses and quit smoking.  Regular exercise and maintaining a healthy weight is needed.  Stress reduction may help. A  CLINICAL SUMMARY including written plan identify barriers to care unique to individual due to social or financial issues.  We attempt to mutually creat solutions for individual and family understanding.    PVD (peripheral vascular disease) (HCC) No recent  claudication symptoms     Endocrine   Type 2 diabetes mellitus with diabetic polyneuropathy (HCC)   Relevant Orders   Hemoglobin A1c (Completed) An individual care plan for diabetes was established and reinforced today.  The patient's status was assessed using clinical findings on exam, labs and diagnostic testing. Patient success at meeting goals based on disease specific evidence-based guidelines and found to be good controlled. Medications were assessed and patient's understanding of the medical issues , including barriers were assessed. Recommend adherence to a diabetic diet, a graduated exercise program, HgbA1c level is checked quarterly, and urine microalbumin performed yearly .  Annual mono-filament sensation testing performed. Lower blood pressure and control hyperlipidemia is important. Get annual eye exams and annual flu shots and smoking cessation discussed.  Self management goals were discussed.    Type 2 diabetes mellitus with stage 3 chronic kidney disease ([place]) An individual care plan for diabetes was established and reinforced today.  The patient's status was assessed using clinical findings on exam, labs and diagnostic testing. Patient success at meeting goals based on disease specific evidence-based guidelines and found to be good controlled. Medications were assessed and patient's understanding of the medical issues , including barriers were assessed. Recommend adherence to a diabetic diet, a graduated exercise program, HgbA1c level is checked quarterly, and urine microalbumin performed yearly .  Annual mono-filament sensation testing performed. Lower blood pressure and control hyperlipidemia is important. Get annual eye exams and annual flu shots and smoking cessation discussed.  Self management goals were discussed.      Musculoskeletal and Integument   Osteoarthritis of left knee   Relevant Medications   HYDROcodone-acetaminophen (NORCO) 10-325 MG tablet Continued pain I   knee, may need TKA in future     Genitourinary   Chronic kidney disease, stage 3a ([place]) Patient was evaluated for 3a.  It is important to maintain good Blood Pressure control and diabetes control. Keep on low protein diet and remain with adequate hydration to maintain function.     BPH (benign prostatic hyperplasia)   Relevant Orders   PSA (Completed) AN INDIVIDUAL CARE PLAN for BPH was established and reinforced today.  The patient's status was assessed using clinical findings on exam, labs, and other diagnostic testing. Patient's success at meeting treatment goals based on disease specific evidence-bassed guidelines and found to be in good control. RECOMMENDATIONS include maintaining present medicines and treatment.      Other   Mixed hyperlipidemia - Primary   Relevant Orders   Lipid panel (Completed) AN INDIVIDUAL CARE PLAN for hyperlipidemia/ cholesterol was established and reinforced today.  The patient's status was assessed using clinical findings on exam, lab and other diagnostic tests. The patient's disease status was assessed based on evidence-based guidelines and found to be well controlled. MEDICATIONS were reviewed. SELF MANAGEMENT GOALS have been discussed and patient's success at attaining the goal of low cholesterol was assessed. RECOMMENDATION given include regular exercise 3 days a week and low cholesterol/low fat diet. CLINICAL SUMMARY including written plan to identify barriers unique to the patient due to social or economic  reasons was discussed.    BMI 25.0-25.9,adult Continue diet and exercise   Other Visit Diagnoses     Need for hepatitis C screening test  Relevant Orders   Hepatitis C Antibody (Completed)   Need for immunization against influenza       Relevant Orders   Flu Vaccine QUAD High Dose(Fluad) (Completed)   Flu Vaccine QUAD High Dose(Fluad) (Completed)     .A total of 40 minutes were spent face-to-face with the patient during this encounter  and over half of that time was spent on counseling and coordination of care.      Orders Placed This Encounter  Procedures   Flu Vaccine QUAD High Dose(Fluad)   Flu Vaccine QUAD High Dose(Fluad)   Comprehensive metabolic panel   Hemoglobin A1c   Lipid panel   CBC with Differential/Platelet   PSA   Hepatitis C Antibody   Cardiovascular Risk Assessment     Follow-up: Return in about 4 months (around 03/08/2022).  An After Visit Summary was printed and given to the patient.  Reinaldo Meeker, MD [facility] [phone]

## 2021-11-06 LAB — LIPID PANEL
Chol/HDL Ratio: 3 ratio (ref 0.0–5.0)
Cholesterol, Total: 157 mg/dL (ref 100–199)
HDL: 52 mg/dL (ref >39)
LDL Chol Calc (NIH): 89 mg/dL (ref 0–99)
Triglycerides: 86 mg/dL (ref 0–149)
VLDL Cholesterol Cal: 16 mg/dL (ref 5–40)

## 2021-11-06 LAB — CBC WITH DIFFERENTIAL/PLATELET
Basophils Absolute: 0.1 10*3/uL (ref 0.0–0.2)
Basos: 1 %
EOS (ABSOLUTE): 0.2 10*3/uL (ref 0.0–0.4)
Eos: 3 %
Hematocrit: 45.9 % (ref 37.5–51.0)
Hemoglobin: 15.7 g/dL (ref 13.0–17.7)
Immature Grans (Abs): 0 10*3/uL (ref 0.0–0.1)
Immature Granulocytes: 0 %
Lymphocytes Absolute: 1.9 10*3/uL (ref 0.7–3.1)
Lymphs: 22 %
MCH: 31.2 pg (ref 26.6–33.0)
MCHC: 34.2 g/dL (ref 31.5–35.7)
MCV: 91 fL (ref 79–97)
Monocytes Absolute: 0.7 10*3/uL (ref 0.1–0.9)
Monocytes: 8 %
Neutrophils Absolute: 5.6 10*3/uL (ref 1.4–7.0)
Neutrophils: 66 %
Platelets: 304 10*3/uL (ref 150–450)
RBC: 5.04 x10E6/uL (ref 4.14–5.80)
RDW: 14.5 % (ref 11.6–15.4)
WBC: 8.4 10*3/uL (ref 3.4–10.8)

## 2021-11-06 LAB — COMPREHENSIVE METABOLIC PANEL
ALT: 15 IU/L (ref 0–44)
AST: 12 IU/L (ref 0–40)
Albumin/Globulin Ratio: 1.8 (ref 1.2–2.2)
Albumin: 4.4 g/dL (ref 3.8–4.8)
Alkaline Phosphatase: 86 IU/L (ref 44–121)
BUN/Creatinine Ratio: 20 (ref 10–24)
BUN: 17 mg/dL (ref 8–27)
Bilirubin Total: 0.6 mg/dL (ref 0.0–1.2)
CO2: 20 mmol/L (ref 20–29)
Calcium: 9.8 mg/dL (ref 8.6–10.2)
Chloride: 103 mmol/L (ref 96–106)
Creatinine, Ser: 0.83 mg/dL (ref 0.76–1.27)
Globulin, Total: 2.4 g/dL (ref 1.5–4.5)
Glucose: 101 mg/dL — ABNORMAL HIGH (ref 70–99)
Potassium: 4.5 mmol/L (ref 3.5–5.2)
Sodium: 141 mmol/L (ref 134–144)
Total Protein: 6.8 g/dL (ref 6.0–8.5)
eGFR: 94 mL/min/{1.73_m2} (ref >59)

## 2021-11-06 LAB — HEPATITIS C ANTIBODY: Hep C Virus Ab: NONREACTIVE

## 2021-11-06 LAB — HEMOGLOBIN A1C
Est. average glucose Bld gHb Est-mCnc: 174 mg/dL
Hgb A1c MFr Bld: 7.7 % — ABNORMAL HIGH (ref 4.8–5.6)

## 2021-11-06 LAB — CARDIOVASCULAR RISK ASSESSMENT

## 2021-11-06 LAB — PSA: Prostate Specific Ag, Serum: 2.3 ng/mL (ref 0.0–4.0)

## 2021-11-06 NOTE — Progress Notes (Signed)
Glucose 101, kidney and liver tests normal, A1c 7.7, hepatitis c negative, Cholesterol normal, CBC normal, PSA 2.3 normal,  lp

## 2021-11-11 ENCOUNTER — Ambulatory Visit: Payer: Medicare HMO | Admitting: Legal Medicine

## 2021-11-11 ENCOUNTER — Telehealth: Payer: Self-pay

## 2021-11-11 NOTE — Progress Notes (Signed)
Called pt to reschedule appt with CPP and to give number to PAP company for medications   Pt called back and I gave him the numbers to the company and r/s appt. Pt was unsure if he ever got the applications in the mail so I am checking on this in case we need to mail those back off.    Elray Mcgregor, [place] Pharmacist Assistant  [phone]

## 2021-11-12 ENCOUNTER — Telehealth: Payer: Self-pay

## 2021-11-12 NOTE — Telephone Encounter (Signed)
Patient called stating pharmacy informed him they would only five him enough medication of Hydrocodone for 7 days. Then he would need to contact his PCP for a refill. He is concerned he will not be able to get a refill.

## 2021-11-12 NOTE — Telephone Encounter (Signed)
We can refill to another pharmacy lp

## 2021-11-13 ENCOUNTER — Other Ambulatory Visit: Payer: Self-pay | Admitting: Legal Medicine

## 2021-11-13 ENCOUNTER — Ambulatory Visit (INDEPENDENT_AMBULATORY_CARE_PROVIDER_SITE_OTHER): Payer: Medicare HMO

## 2021-11-13 DIAGNOSIS — E1142 Type 2 diabetes mellitus with diabetic polyneuropathy: Secondary | ICD-10-CM

## 2021-11-13 DIAGNOSIS — M1712 Unilateral primary osteoarthritis, left knee: Secondary | ICD-10-CM

## 2021-11-13 DIAGNOSIS — E782 Mixed hyperlipidemia: Secondary | ICD-10-CM

## 2021-11-13 DIAGNOSIS — I10 Essential (primary) hypertension: Secondary | ICD-10-CM

## 2021-11-13 MED ORDER — HYDROCODONE-ACETAMINOPHEN 10-325 MG PO TABS: 1.0000 | ORAL_TABLET | Freq: Two times a day (BID) | ORAL | 0 refills | Status: DC | PRN

## 2021-11-13 NOTE — Progress Notes (Signed)
Chronic Care Management Pharmacy Note  11/13/2021 Name:  Clayton James MRN:  [id number] DOB:  [birth year]  Summary: -Pleasant 71 year old male presents for initial CCM visit. 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 While doing this, 2 of his sons died and he decided to retire. 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  -He hunts deer every year. He's excited for the new season, he does both bow and rifle and is teaching his grandson how -Isn't going bow hunting this year (2023) but he hopes to have a deer by our next call in November  Recommendations/Changes made from today's visit: -PAP for Tresiba/Farxiga. Will re-send out    Subjective: Clayton James is an 71 y.o. year old male who is a primary patient of Henrene Pastor, Zeb Comfort, MD.  The CCM team was consulted for assistance with disease management and care coordination needs.    Engaged with patient by telephone for follow up visit in response to provider referral for pharmacy case management and/or care coordination services.   Consent to Services:  The patient was given the following information about Chronic Care Management services today, agreed to services, and gave verbal consent: 1. CCM service includes personalized support from designated clinical staff supervised by the primary care provider, including individualized plan of care and coordination with other care providers 2. 24/7 contact phone numbers for assistance for urgent and routine care needs. 3. Service will only be billed when office clinical staff spend 20 minutes or more in a month to coordinate care. 4. Only one practitioner may furnish and bill the service in a calendar month. 5.The patient may stop CCM services at any time (effective at the end of the month) by  phone call to the office staff. 6. The patient will be responsible for cost sharing (co-pay) of up to 20% of the service fee (after annual deductible is met). Patient agreed to services and consent obtained.  Patient Care Team: Lillard Anes, MD as PCP - General (Family Medicine) Lane Hacker, [facility] (Pharmacist)  Recent office visits:  04-03-2021 Lillard Anes, MD. Albumin/Globulin= 2.3. A1C= 7.3. LDL= 117   Recent consult visits:  05-26-2021 Leandrew Koyanagi, MD (Neurosurgery). Right total knee arthroplasty procedure completed.    05-20-2021 Pre-op testing   Hospital visits:  None in previous 6 months   Objective:  Lab Results  Component Value Date   CREATININE 0.83 11/05/2021   BUN 17 11/05/2021   GFRNONAA >60 05/20/2021   GFRAA 109 02/15/2020   NA 141 11/05/2021   K 4.5 11/05/2021   CALCIUM 9.8 11/05/2021   CO2 20 11/05/2021   GLUCOSE 101 (H) 11/05/2021    Lab Results  Component Value Date/Time   HGBA1C 7.7 (H) 11/05/2021 10:49 AM   HGBA1C 6.8 (H) 07/08/2021 02:18 PM   MICROALBUR 80 05/17/2019 09:03 AM    Last diabetic Eye exam:  Lab Results  Component Value Date/Time   HMDIABEYEEXA No Retinopathy 07/04/2020 12:00 AM    Last diabetic Foot exam: No results found for: "HMDIABFOOTEX"   Lab Results  Component Value Date   CHOL 157 11/05/2021   HDL 52 11/05/2021   LDLCALC 89 11/05/2021   TRIG 86 11/05/2021   CHOLHDL 3.0 11/05/2021  Latest Ref Rng & Units 11/05/2021   10:49 AM 07/08/2021    2:18 PM 05/20/2021   11:21 AM  Hepatic Function  Total Protein 6.0 - 8.5 g/dL 6.8  6.8  7.1   Albumin 3.8 - 4.8 g/dL 4.4  4.5  4.2   AST 0 - 40 IU/L 12  14  19    ALT 0 - 44 IU/L 15  13  24    Alk Phosphatase 44 - 121 IU/L 86  82  75   Total Bilirubin 0.0 - 1.2 mg/dL 0.6  0.8  0.7     No results found for: "TSH", "FREET4"     Latest Ref Rng & Units 11/05/2021   10:49 AM 07/08/2021    2:18 PM 05/27/2021    5:59 AM  CBC  WBC 3.4 - 10.8 x10E3/uL  8.4  6.7  10.1   Hemoglobin 13.0 - 17.7 g/dL 15.7  14.4  13.6   Hematocrit 37.5 - 51.0 % 45.9  42.4  40.4   Platelets 150 - 450 x10E3/uL 304  326  228     No results found for: "VD25OH"  Clinical ASCVD: Yes  The 10-year ASCVD risk score (Arnett DK, et al., 2019) is: 43.1%   Values used to calculate the score:     Age: 71 years     Sex: Male     Is Non-Hispanic African American: No     Diabetic: Yes     Tobacco smoker: No     Systolic Blood Pressure: 370 mmHg     Is BP treated: Yes     HDL Cholesterol: 52 mg/dL     Total Cholesterol: 157 mg/dL       04/03/2021    7:43 AM 06/20/2020    9:07 AM 04/24/2020    2:29 PM  Depression screen PHQ 2/9  Decreased Interest 0 0 0  Down, Depressed, Hopeless 0 0 0  PHQ - 2 Score 0 0 0     Other: (CHADS2VASc if Afib, MMRC or CAT for COPD, ACT, DEXA)  Social History   Tobacco Use  Smoking Status Former   Packs/day: 0.50   Years: 8.00   Total pack years: 4.00   Types: Cigarettes   Quit date: 2000   Years since quitting: 23.8  Smokeless Tobacco Current   Types: Chew  Tobacco Comments   1 can per week   BP Readings from Last 3 Encounters:  11/05/21 (!) 150/90  07/08/21 120/60  05/27/21 140/82   Pulse Readings from Last 3 Encounters:  11/05/21 71  07/08/21 87  05/27/21 75   Wt Readings from Last 3 Encounters:  11/05/21 192 lb (87.1 kg)  07/08/21 185 lb (83.9 kg)  05/26/21 190 lb (86.2 kg)   BMI Readings from Last 3 Encounters:  11/05/21 26.78 kg/m  07/08/21 25.80 kg/m  05/26/21 26.50 kg/m    Assessment/Interventions: Review of patient past medical history, allergies, medications, health status, including review of consultants reports, laboratory and other test data, was performed as part of comprehensive evaluation and provision of chronic care management services.   SDOH:  (Social Determinants of Health) assessments and interventions performed: Yes SDOH Interventions    Flowsheet Row Chronic Care Management from  09/19/2021 in [place] [facility] from 08/07/2021 in [place] [facility] from 10/21/2020 in [place] from 04/24/2020 in [place]  SDOH Interventions      Food Insecurity Interventions -- -- -- Intervention Not Indicated  Housing Interventions -- -- -- Intervention Not Indicated  Transportation Interventions Intervention Not Indicated Intervention Not Indicated Intervention Not Indicated Intervention Not Indicated  Financial Strain Interventions Other (Comment)  [PAP] Other (Comment)  [(Farxiga/Tresiba PAP)] Other (Comment) Other (Comment)  [patient assistance applications started for diabetes medication]      SDOH Screenings   Food Insecurity: No Food Insecurity (04/24/2020)  Housing: Low Risk  (04/24/2020)  Transportation Needs: No Transportation Needs (09/19/2021)  Alcohol Screen: Low Risk  (06/20/2020)  Depression (PHQ2-9): Low Risk  (04/03/2021)  Financial Resource Strain: High Risk (09/19/2021)  Tobacco Use: High Risk (11/05/2021)    [place]  Allergies  Allergen Reactions   Sulfamethoxazole Rash    Medications Reviewed Today     Reviewed by Lane Hacker, [facility] (Pharmacist) on 11/13/21 at [place] List Status: <None>   Medication Order Taking? Sig Documenting Provider Last Dose Status Informant  ALPRAZolam (XANAX) 0.5 MG tablet [id number]  Take 1 tablet by mouth twice daily as needed Lillard Anes, MD  Active   amLODipine-benazepril (LOTREL) 10-20 MG capsule [id number] No Take 1 capsule by mouth once daily Cox, Elnita Maxwell, MD Taking Active   aspirin EC 81 MG tablet [id number] No Take 1 tablet (81 mg total) by mouth 2 (two) times daily. To be taken after surgery to prevent blood clots Aundra Dubin, PA-C Taking Active   atorvastatin (LIPITOR) 40 MG tablet [id number] No Take 1 tablet (40 mg total) by mouth daily. Lillard Anes, MD Taking Active Self  Blood Glucose Monitoring Suppl (ONE  TOUCH ULTRA 2) w/Device Drucie Opitz [id number] No USE TO CHECK GLUCOSE THREE TIMES DAILY [provider] Taking Active Self  dapagliflozin propanediol (FARXIGA) 10 MG TABS tablet [id number] No Take 1 tablet (10 mg total) by mouth daily. Lillard Anes, MD Taking Active   diclofenac Sodium (VOLTAREN) 1 % GEL [id number] No Apply 2 g topically daily as needed (pain). [provider] Taking Active Self  HYDROcodone-acetaminophen (NORCO) 10-325 MG tablet [id number]  Take 1 tablet by mouth every 8 (eight) hours as needed. Lillard Anes, MD  Active   Insulin Pen Needle (BD PEN NEEDLE MICRO U/F) 32G X 6 MM MISC [id number] No USE AS DIRECTED ONCE DAILY Lillard Anes, MD Taking Active Self  metFORMIN (GLUCOPHAGE) 1000 MG tablet [id number] No Take 1 tablet by mouth twice daily Lillard Anes, MD Taking Active Self  methocarbamol (ROBAXIN-750) 750 MG tablet [id number] No Take 1 tablet (750 mg total) by mouth 2 (two) times daily as needed for muscle spasms.  Patient not taking: Reported on 11/05/2021   Aundra Dubin, PA-C Not Taking Active   tamsulosin [facility]) 0.4 MG CAPS capsule [id number] No Take 1 capsule (0.4 mg total) by mouth daily.  Patient not taking: Reported on 11/05/2021   Lillard Anes, MD Not Taking Active   [facility] 100 UNIT/ML FlexTouch Pen [id number]  INJECT 30 [place] Lillard Anes, MD  Active   Med List Note Erie Noe, LPN 70/17/79 3903): Patient approved for [place] Patient Assistance from [place] 06/23/20 through 01/18/21            Patient Active Problem List   Diagnosis Date Noted   Primary osteoarthritis of right knee 05/26/2021   Status post total right knee replacement 05/26/2021   BPH (benign prostatic hyperplasia) 03/20/2021   Rotator cuff tear, right 12/02/2020   Pigmented nevus 04/18/2020   BMI 25.0-25.9,adult 10/04/2019   PVD (peripheral vascular disease) (  [place]) 08/28/2019    Osteoarthritis of left knee 05/04/2019   Mixed hyperlipidemia 04/27/2019   Chronic kidney disease, stage 3a ([place]) 04/27/2019   Type 2 diabetes mellitus with diabetic polyneuropathy ([place]) 04/27/2019   Type 2 diabetes mellitus with stage 3 chronic kidney disease ([place]) 04/27/2019   Benign essential hypertension 04/27/2019   Osteoarthritis of right knee 04/27/2019    Immunization History  Administered Date(s) Administered   Fluad Quad(high Dose 65+) 12/02/2020, 11/05/2021, 11/05/2021   Influenza-Unspecified 11/16/2018   PFIZER Comirnaty(Gray Top)Covid-19 Tri-Sucrose Vaccine 03/30/2019, 04/25/2019   Pneumococcal Polysaccharide-23 04/24/2020    Conditions to be addressed/monitored:  Hypertension, Hyperlipidemia, Diabetes, and Anxiety  Care Plan : [place]  Updates made by Lane Hacker, [place] since 11/13/2021 12:00 AM     Problem: DM, HTN, Mental health, Lipids   Priority: High  Onset Date: 10/21/2020     Long-Range Goal: Disease State Management   Start Date: 10/21/2020  Expected End Date: 10/21/2021  Recent Progress: On track  Priority: High  Note:   Current Barriers:  Does not maintain contact with provider office Does not contact provider office for questions/concerns  Pharmacist Clinical Goal(s):  Patient will contact provider office for questions/concerns as evidenced notation of same in electronic health record through collaboration with PharmD and provider.   Interventions: 1:1 collaboration with Lillard Anes, MD regarding development and update of comprehensive plan of care as evidenced by provider attestation and co-signature Inter-disciplinary care team collaboration (see longitudinal plan of care) Comprehensive medication review performed; medication list updated in electronic medical record  Hypertension (BP goal <140/90) BP Readings from Last 3 Encounters:  11/05/21 (!) 150/90  07/08/21 120/60  05/27/21 140/82  -Controlled -Current  treatment: Amlodipine/Benazapril 10/42m QD Appropriate, Effective, Safe, Accessible -Medications previously tried: N/A  -Current home readings: Doesn't test -Current dietary habits: "Tries to eat Healthy" -Current exercise habits: Does chores around the house -Denies hypotensive/hypertensive symptoms -Educated on BP goals and benefits of medications for prevention of heart attack, stroke and kidney damage; Extensive time spent on counseling patient on blood pressure goal and impact of each antihypertensive medication on their blood pressure and risk reduction for CV disease.  Used analogies to explain the need for multiple antihypertensive medications to achieve BP goals and that it is often a silent disease with no symptoms.  -Counseled to monitor BP at home PRN, document, and provide log at future appointments -Recommended to continue current medication  Hyperlipidemia: (LDL goal < 100) Lab Results  Component Value Date   CHOL 157 11/05/2021   CHOL 146 07/08/2021   CHOL 198 04/03/2021   Lab Results  Component Value Date   HDL 52 11/05/2021   HDL 62 07/08/2021   HDL 60 04/03/2021   Lab Results  Component Value Date   LDLCALC 89 11/05/2021   LDLCALC 67 07/08/2021   LDLCALC 117 (H) 04/03/2021   Lab Results  Component Value Date   TRIG 86 11/05/2021   TRIG 88 07/08/2021   TRIG 117 04/03/2021   Lab Results  Component Value Date   CHOLHDL 3.0 11/05/2021   CHOLHDL 2.4 07/08/2021   CHOLHDL 3.3 04/03/2021  No results found for: "LDLDIRECT" -Controlled -Current treatment: Atorvastatin 410mAppropriate, Effective, Safe, Accessible -Medications previously tried: N/A  -Current dietary habits: "Tries to eat Healthy" -Current exercise habits: Does chores around the house -Educated on Cholesterol goals;  Over 15 minutes spent counseling patient on role of hyperlipidemia on heart disease and the impact of each lipid subclass  with emphasis on LDL and heart disease risk.  Explained  role of statins in prevention of CV disease complications and actual risk for adverse effects with statin treatment.  Counseled patient on genetic component placing them at risk for hyperlipidemia. -Recommended to continue current medication  Diabetes (A1c goal <7%) Lab Results  Component Value Date   HGBA1C 7.7 (H) 11/05/2021   HGBA1C 6.8 (H) 07/08/2021   HGBA1C 7.3 (H) 04/03/2021   Lab Results  Component Value Date   MICROALBUR 80 05/17/2019   LDLCALC 89 11/05/2021   CREATININE 0.83 11/05/2021   Lab Results  Component Value Date   NA 141 11/05/2021   K 4.5 11/05/2021   CREATININE 0.83 11/05/2021   EGFR 94 11/05/2021   GFRNONAA >60 05/20/2021   GLUCOSE 101 (H) 11/05/2021   Lab Results  Component Value Date   WBC 8.4 11/05/2021   HGB 15.7 11/05/2021   HCT 45.9 11/05/2021   MCV 91 11/05/2021   PLT 304 11/05/2021  -Controlled -Current medications: Farxiga 76m Appropriate, Effective, Safe, Query accessible Tresiba 30 units Appropriate, Effective, Safe, Query accessible Metformin 10082mAppropriate, Effective, Safe, Accessible -Medications previously tried: N/A  -Current home glucose readings fasting glucose: Didn't have logs on him post prandial glucose: Didn't have logs on him -Denies hypoglycemic/hyperglycemic symptoms -Current meal patterns: Patient didn't answer initial question and since his labs are good, I didn't repeat myself -Current exercise: Chores -Educated on A1c and blood sugar goals; Extensive time was spent counseling patient on the pathophysiology and risk for complications with uncontrolled Diabetes.  Used teach back method for counseling on ADA diabetic diet.  Time spent on explaining role of carbohydrates versus sugars impacting blood glucose levels and meal planning. Current exercise consists of walking 10 minutes three times a week, plan is to increase to 20 minutes 5 days a week and then after 2-3 weeks increase to 30 minute 5 days a week.  -Counseled  to check feet daily and get yearly eye exams July 2023: PAP for Farxiga/Tresiba. Makes $45,000/year between him and wife August 2023: Patient found out he makes $65,000/year between him and his wife. He states he never got PAP paperwork in the mail but didn't think about calling. Will re-send out October 2023: Will re-send PAP again. States he hasn't gotten in the mail  Depression/Anxiety -Patient has lost 2 of his sons, said, "If I didn't take this medicine my mind would just race and I wouldn't be able to focus or sleep or anything" -Controlled -Current treatment: Alprazolam BID Appropriate, Effective, Safe, Accessible -Medications previously tried/failed: N/A -PHQ9:     04/03/2021    7:43 AM 06/20/2020    9:07 AM 04/24/2020    2:29 PM  Depression screen PHQ 2/9  Decreased Interest 0 0 0  Down, Depressed, Hopeless 0 0 0  PHQ - 2 Score 0 0 0  -GAD7:      No data to display        -Educated on Benefits of medication for symptom control October 2022: Patient could try preventative therapy (SSRI/SNRI/Etc) so he doesn't have to take BNZ as often, patient wasn't open to changing any meds today. July 2023: Same as before  Pain Controlled Pain Scale: Without Meds  -October 2022: 8/10 With Meds: -October 2022: 3-4/10 (Is able to do chores, just is slower) -Current medications: Diclofenac Appropriate, Effective, Safe, Accessible Oxycodone/APAP 5/325 Appropriate, Effective, Safe, Accessible -Medications previously tried: N/A July 2023: Patient's priority was cost of meds. Unable to do pain scale  but he did say that post knee Sx in May, he feels significantly better    Patient Goals/Self-Care Activities Patient will:  - take medications as prescribed  Follow Up Plan: Face to Face appointment with care management team member scheduled for:  November 2023  [place] Constable, Pharm.D. - [phone]        Medication Assistance:  -Starting process for  PAP Lisabeth Pick  Compliance/Adherence/Medication fill history: Care Gaps: Last eye exam / Retinopathy Screening? 07-01-2020 Last Annual Wellness Visit? 04-24-2020 Last Diabetic Foot Exam? Patient states Dr. Henrene Pastor examines feet.   Star Rating Drugs: Farxiga 10 mg- Patient assistance Metformin 1000 mg- Last filled 05-27-2021 90 DS Atorvastatin 40 mg- Last filled 03-27-2021 90 DS Amlodipine-benazepril 10-20 mg- Last filled 04-17-2021 90 DS   Patient's preferred pharmacy is:  [facility] [address] [place] [address] [place] [address] Phone: [phone] Fax: [phone]  Uses pill box? No - Doesn't think he needs Pt endorses 100% compliance  Care Plan and Follow Up Patient Decision:  Patient agrees to Care Plan and Follow-up.  Plan: The patient has been provided with contact information for the care management team and has been advised to call with any health related questions or concerns.   [place] Constable, Pharm.D. - [phone]  F/U: November 2023

## 2021-11-13 NOTE — Patient Instructions (Signed)
Visit Information   Goals Addressed   None    Patient Care Plan: CCM Pharmacy Care Plan     Problem Identified: DM, HTN, Mental health, Lipids   Priority: High  Onset Date: 10/21/2020     Long-Range Goal: Disease State Management   Start Date: 10/21/2020  Expected End Date: 10/21/2021  Recent Progress: On track  Priority: High  Note:   Current Barriers:  Does not maintain contact with provider office Does not contact provider office for questions/concerns  Pharmacist Clinical Goal(s):  Patient will contact provider office for questions/concerns as evidenced notation of same in electronic health record through collaboration with PharmD and provider.   Interventions: 1:1 collaboration with Lillard Anes, MD regarding development and update of comprehensive plan of care as evidenced by provider attestation and co-signature Inter-disciplinary care team collaboration (see longitudinal plan of care) Comprehensive medication review performed; medication list updated in electronic medical record  Hypertension (BP goal <140/90) BP Readings from Last 3 Encounters:  11/05/21 (!) 150/90  07/08/21 120/60  05/27/21 140/82  -Controlled -Current treatment: Amlodipine/Benazapril 10/3m QD Appropriate, Effective, Safe, Accessible -Medications previously tried: N/A  -Current home readings: Doesn't test -Current dietary habits: "Tries to eat Healthy" -Current exercise habits: Does chores around the house -Denies hypotensive/hypertensive symptoms -Educated on BP goals and benefits of medications for prevention of heart attack, stroke and kidney damage; Extensive time spent on counseling patient on blood pressure goal and impact of each antihypertensive medication on their blood pressure and risk reduction for CV disease.  Used analogies to explain the need for multiple antihypertensive medications to achieve BP goals and that it is often a silent disease with no symptoms.  -Counseled  to monitor BP at home PRN, document, and provide log at future appointments -Recommended to continue current medication  Hyperlipidemia: (LDL goal < 100) Lab Results  Component Value Date   CHOL 157 11/05/2021   CHOL 146 07/08/2021   CHOL 198 04/03/2021   Lab Results  Component Value Date   HDL 52 11/05/2021   HDL 62 07/08/2021   HDL 60 04/03/2021   Lab Results  Component Value Date   LDLCALC 89 11/05/2021   LDLCALC 67 07/08/2021   LDLCALC 117 (H) 04/03/2021   Lab Results  Component Value Date   TRIG 86 11/05/2021   TRIG 88 07/08/2021   TRIG 117 04/03/2021   Lab Results  Component Value Date   CHOLHDL 3.0 11/05/2021   CHOLHDL 2.4 07/08/2021   CHOLHDL 3.3 04/03/2021  No results found for: "LDLDIRECT" -Controlled -Current treatment: Atorvastatin 419mAppropriate, Effective, Safe, Accessible -Medications previously tried: N/A  -Current dietary habits: "Tries to eat Healthy" -Current exercise habits: Does chores around the house -Educated on Cholesterol goals;  Over 15 minutes spent counseling patient on role of hyperlipidemia on heart disease and the impact of each lipid subclass with emphasis on LDL and heart disease risk.  Explained role of statins in prevention of CV disease complications and actual risk for adverse effects with statin treatment.  Counseled patient on genetic component placing them at risk for hyperlipidemia. -Recommended to continue current medication  Diabetes (A1c goal <7%) Lab Results  Component Value Date   HGBA1C 7.7 (H) 11/05/2021   HGBA1C 6.8 (H) 07/08/2021   HGBA1C 7.3 (H) 04/03/2021   Lab Results  Component Value Date   MICROALBUR 80 05/17/2019   LDLCALC 89 11/05/2021   CREATININE 0.83 11/05/2021   Lab Results  Component Value Date   NA 141 11/05/2021  K 4.5 11/05/2021   CREATININE 0.83 11/05/2021   EGFR 94 11/05/2021   GFRNONAA >60 05/20/2021   GLUCOSE 101 (H) 11/05/2021   Lab Results  Component Value Date   WBC 8.4  11/05/2021   HGB 15.7 11/05/2021   HCT 45.9 11/05/2021   MCV 91 11/05/2021   PLT 304 11/05/2021  -Controlled -Current medications: Farxiga 54m Appropriate, Effective, Safe, Query accessible Tresiba 30 units Appropriate, Effective, Safe, Query accessible Metformin 10090mAppropriate, Effective, Safe, Accessible -Medications previously tried: N/A  -Current home glucose readings fasting glucose: Didn't have logs on him post prandial glucose: Didn't have logs on him -Denies hypoglycemic/hyperglycemic symptoms -Current meal patterns: Patient didn't answer initial question and since his labs are good, I didn't repeat myself -Current exercise: Chores -Educated on A1c and blood sugar goals; Extensive time was spent counseling patient on the pathophysiology and risk for complications with uncontrolled Diabetes.  Used teach back method for counseling on ADA diabetic diet.  Time spent on explaining role of carbohydrates versus sugars impacting blood glucose levels and meal planning. Current exercise consists of walking 10 minutes three times a week, plan is to increase to 20 minutes 5 days a week and then after 2-3 weeks increase to 30 minute 5 days a week.  -Counseled to check feet daily and get yearly eye exams July 2023: PAP for Farxiga/Tresiba. Makes $45,000/year between him and wife August 2023: Patient found out he makes $65,000/year between him and his wife. He states he never got PAP paperwork in the mail but didn't think about calling. Will re-send out October 2023: Will re-send PAP again. States he hasn't gotten in the mail  Depression/Anxiety -Patient has lost 2 of his sons, said, "If I didn't take this medicine my mind would just race and I wouldn't be able to focus or sleep or anything" -Controlled -Current treatment: Alprazolam BID Appropriate, Effective, Safe, Accessible -Medications previously tried/failed: N/A -PHQ9:     04/03/2021    7:43 AM 06/20/2020    9:07 AM 04/24/2020     2:29 PM  Depression screen PHQ 2/9  Decreased Interest 0 0 0  Down, Depressed, Hopeless 0 0 0  PHQ - 2 Score 0 0 0  -GAD7:      No data to display        -Educated on Benefits of medication for symptom control October 2022: Patient could try preventative therapy (SSRI/SNRI/Etc) so he doesn't have to take BNZ as often, patient wasn't open to changing any meds today. July 2023: Same as before  Pain Controlled Pain Scale: Without Meds  -October 2022: 8/10 With Meds: -October 2022: 3-4/10 (Is able to do chores, just is slower) -Current medications: Diclofenac Appropriate, Effective, Safe, Accessible Oxycodone/APAP 5/325 Appropriate, Effective, Safe, Accessible -Medications previously tried: N/A July 2023: Patient's priority was cost of meds. Unable to do pain scale but he did say that post knee Sx in May, he feels significantly better    Patient Goals/Self-Care Activities Patient will:  - take medications as prescribed  Follow Up Plan: Face to Face appointment with care management team member scheduled for:  November 2023  [place] ConstablePharm.D. - - [phone]     Mr. KeMochizukias given information about Chronic Care Management services today including:  CCM service includes personalized support from designated clinical staff supervised by his physician, including individualized plan of care and coordination with other care providers 24/7 contact phone numbers for assistance for urgent and routine care needs. Standard insurance, coinsurance, copays and  deductibles apply for chronic care management only during months in which we provide at least 20 minutes of these services. Most insurances cover these services at 100%, however patients may be responsible for any copay, coinsurance and/or deductible if applicable. This service may help you avoid the need for more expensive face-to-face services. Only one practitioner may furnish and bill the service in a calendar month. The  patient may stop CCM services at any time (effective at the end of the month) by phone call to the office staff.  Patient agreed to services and verbal consent obtained.   The patient verbalized understanding of instructions, educational materials, and care plan provided today and DECLINED offer to receive copy of patient instructions, educational materials, and care plan.  The pharmacy team will reach out to the patient again over the next 60 days.   Lane Hacker, [place]

## 2021-11-18 ENCOUNTER — Other Ambulatory Visit: Payer: Self-pay | Admitting: Legal Medicine

## 2021-11-18 DIAGNOSIS — M1712 Unilateral primary osteoarthritis, left knee: Secondary | ICD-10-CM

## 2021-11-18 DIAGNOSIS — E1159 Type 2 diabetes mellitus with other circulatory complications: Secondary | ICD-10-CM

## 2021-11-18 DIAGNOSIS — E785 Hyperlipidemia, unspecified: Secondary | ICD-10-CM

## 2021-11-18 DIAGNOSIS — I10 Essential (primary) hypertension: Secondary | ICD-10-CM | POA: Diagnosis not present

## 2021-11-18 DIAGNOSIS — Z7984 Long term (current) use of oral hypoglycemic drugs: Secondary | ICD-10-CM | POA: Diagnosis not present

## 2021-11-18 MED ORDER — HYDROCODONE-ACETAMINOPHEN 10-325 MG PO TABS: 1.0000 | ORAL_TABLET | Freq: Two times a day (BID) | ORAL | 0 refills | Status: DC | PRN

## 2021-11-26 ENCOUNTER — Ambulatory Visit: Payer: Medicare HMO | Admitting: Orthopaedic Surgery

## 2021-11-26 ENCOUNTER — Ambulatory Visit (INDEPENDENT_AMBULATORY_CARE_PROVIDER_SITE_OTHER): Payer: Medicare HMO

## 2021-11-26 DIAGNOSIS — Z96651 Presence of right artificial knee joint: Secondary | ICD-10-CM

## 2021-11-26 DIAGNOSIS — M1712 Unilateral primary osteoarthritis, left knee: Secondary | ICD-10-CM

## 2021-11-26 NOTE — Progress Notes (Signed)
Office Visit Note   Patient: Clayton James           Date of Birth: [birth year]           MRN: [id number] Visit Date: 11/26/2021              Requested by: Lillard Anes, MD [address] [place],  [place] [address] PCP: Lillard Anes, MD   Assessment & Plan: Visit Diagnoses:  1. Status post total right knee replacement   2. Primary osteoarthritis of left knee     Plan: In regards to the right knee replacement he is now 71 months postop and has done very well and very pleased.  Dental prophylaxis reinforced.  Activity as tolerated.  Recheck in 6 months with two-view x-rays of the right knee.  In regards to the left knee he has bone-on-bone in the medial and patellofemoral compartments with varus deformity.  Based on these findings and the fact that conservative treatments no longer provide any relief he has elected to move forward with left total knee replacement hopefully sometime in January.  We will need to recheck his A1c about 2 weeks before planned surgery since his most recent A1c from 3 weeks ago was 7.7.  Questions encouraged and answered.  Jackelyn Poling will call the patient to confirm a surgery date.  Follow-Up Instructions: Return in about 6 months (around 05/27/2022).   Orders:  Orders Placed This Encounter  Procedures   XR Knee 1-2 Views Right   XR KNEE 3 VIEW LEFT   No orders of the defined types were placed in this encounter.     Procedures: No procedures performed   Clinical Data: No additional findings.   Subjective: Chief Complaint  Patient presents with   Right Knee - Follow-up    Right total knee arthroplasty 05/26/2021    HPI Clayton James is a very pleasant 71 year old gentleman who is here for his 71-monthtotal knee replacement follow-up that was done on 05/26/2021 and also to discuss doing a left total knee replacement.  He is very pleased with his right knee and the outcome and he is very limited by his left knee.  He is a very active  gentleman.  He is an avid dBiomedical engineer  The pain in his left knee is severely interfering with quality of life and ADLs.  Review of Systems  Constitutional: Negative.   All other systems reviewed and are negative.    Objective: Vital Signs: There were no vitals taken for this visit.  Physical Exam Vitals and nursing note reviewed.  Constitutional:      Appearance: He is well-developed.  Pulmonary:     Effort: Pulmonary effort is normal.  Abdominal:     Palpations: Abdomen is soft.  Skin:    General: Skin is warm.  Neurological:     Mental Status: He is alert and oriented to person, place, and time.  Psychiatric:        Behavior: Behavior normal.        Thought Content: Thought content normal.        Judgment: Judgment normal.     Ortho Exam Examination of the right knee shows a fully healed surgical scar.  He has excellent painless fluid range of motion.  He has good varus valgus stability.  No joint effusion.  Examination of the left knee shows varus alignment.  Medial joint line tenderness and bossing of the medial tibial plateau.  He has pain and  crepitus throughout range of motion. Specialty Comments:  No specialty comments available.  Imaging: No results found.   PMFS History: Patient Active Problem List   Diagnosis Date Noted   Primary osteoarthritis of right knee 05/26/2021   Status post total right knee replacement 05/26/2021   BPH (benign prostatic hyperplasia) 03/20/2021   Rotator cuff tear, right 12/02/2020   Pigmented nevus 04/18/2020   BMI 25.0-25.9,adult 10/04/2019   PVD (peripheral vascular disease) ([place]) 08/28/2019   Osteoarthritis of left knee 05/04/2019   Mixed hyperlipidemia 04/27/2019   Chronic kidney disease, stage 3a ([place]) 04/27/2019   Type 2 diabetes mellitus with diabetic polyneuropathy ([place]) 04/27/2019   Type 2 diabetes mellitus with stage 3 chronic kidney disease ([place]) 04/27/2019   Benign essential hypertension 04/27/2019    Osteoarthritis of right knee 04/27/2019   Past Medical History:  Diagnosis Date   Arthritis    Benign essential hypertension 04/27/2019   Chronic kidney disease, stage 3a ([place]) 04/27/2019   Mixed hyperlipidemia 04/27/2019   Type 2 diabetes mellitus with diabetic polyneuropathy ([place]) 04/27/2019   Type 2 diabetes mellitus with stage 3 chronic kidney disease ([place]) 04/27/2019    Family History  Problem Relation Age of Onset   Diabetes Mother    Kidney disease Mother    Heart disease Father     Past Surgical History:  Procedure Laterality Date   ELBOW SURGERY Left 1994   KNEE ARTHROSCOPY Right 1998   KNEE CARTILAGE SURGERY Left 1994   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   Procedure: RIGHT TOTAL KNEE ARTHROPLASTY;  Surgeon: Leandrew Koyanagi, MD;  Location: [facility];  Service: Orthopedics;  Laterality: Right;   Social History   Occupational History   Not on file  Tobacco Use   Smoking status: Former    Packs/day: 0.50    Years: 8.00    Total pack years: 4.00    Types: Cigarettes    Quit date: 2000    Years since quitting: 23.8   Smokeless tobacco: Current    Types: Chew   Tobacco comments:    1 can per week  Vaping Use   Vaping Use: Never used  Substance and Sexual Activity   Alcohol use: Not Currently   Drug use: Never   Sexual activity: Yes    Partners: Female

## 2021-11-29 ENCOUNTER — Other Ambulatory Visit: Payer: Self-pay | Admitting: Legal Medicine

## 2021-11-29 DIAGNOSIS — E1142 Type 2 diabetes mellitus with diabetic polyneuropathy: Secondary | ICD-10-CM

## 2021-12-12 ENCOUNTER — Other Ambulatory Visit: Payer: Self-pay | Admitting: Legal Medicine

## 2021-12-12 DIAGNOSIS — F419 Anxiety disorder, unspecified: Secondary | ICD-10-CM

## 2021-12-17 ENCOUNTER — Ambulatory Visit (INDEPENDENT_AMBULATORY_CARE_PROVIDER_SITE_OTHER): Payer: Medicare HMO

## 2021-12-17 DIAGNOSIS — F419 Anxiety disorder, unspecified: Secondary | ICD-10-CM

## 2021-12-17 DIAGNOSIS — I10 Essential (primary) hypertension: Secondary | ICD-10-CM

## 2021-12-17 DIAGNOSIS — E1142 Type 2 diabetes mellitus with diabetic polyneuropathy: Secondary | ICD-10-CM

## 2021-12-17 NOTE — Patient Instructions (Signed)
Visit Information   Goals Addressed   None    Patient Care Plan: CCM Pharmacy Care Plan     Problem Identified: DM, HTN, Mental health, Lipids   Priority: High  Onset Date: 10/21/2020     Long-Range Goal: Disease State Management   Start Date: 10/21/2020  Expected End Date: 10/21/2021  Recent Progress: On track  Priority: High  Note:   Current Barriers:  Does not maintain contact with provider office Does not contact provider office for questions/concerns  Pharmacist Clinical Goal(s):  Patient will contact provider office for questions/concerns as evidenced notation of same in electronic health record through collaboration with PharmD and provider.   Interventions: 1:1 collaboration with Lillard Anes, MD regarding development and update of comprehensive plan of care as evidenced by provider attestation and co-signature Inter-disciplinary care team collaboration (see longitudinal plan of care) Comprehensive medication review performed; medication list updated in electronic medical record  Hypertension (BP goal <140/90) BP Readings from Last 3 Encounters:  11/05/21 (!) 150/90  07/08/21 120/60  05/27/21 140/82  -Controlled -Current treatment: Amlodipine/Benazapril 10/40m QD Appropriate, Effective, Safe, Accessible -Medications previously tried: N/A  -Current home readings: Doesn't test -Current dietary habits: "Tries to eat Healthy" -Current exercise habits: Does chores around the house -Denies hypotensive/hypertensive symptoms -Educated on BP goals and benefits of medications for prevention of heart attack, stroke and kidney damage; Extensive time spent on counseling patient on blood pressure goal and impact of each antihypertensive medication on their blood pressure and risk reduction for CV disease.  Used analogies to explain the need for multiple antihypertensive medications to achieve BP goals and that it is often a silent disease with no symptoms.  -Counseled  to monitor BP at home PRN, document, and provide log at future appointments November 2023: Sent msg to PCP asking for AWV so we can get BP reading and fix his BP score for 2023. If still elevated, recommend treatment  Hyperlipidemia: (LDL goal < 100) Lab Results  Component Value Date   CHOL 157 11/05/2021   CHOL 146 07/08/2021   CHOL 198 04/03/2021   Lab Results  Component Value Date   HDL 52 11/05/2021   HDL 62 07/08/2021   HDL 60 04/03/2021   Lab Results  Component Value Date   LDLCALC 89 11/05/2021   LDLCALC 67 07/08/2021   LDLCALC 117 (H) 04/03/2021   Lab Results  Component Value Date   TRIG 86 11/05/2021   TRIG 88 07/08/2021   TRIG 117 04/03/2021   Lab Results  Component Value Date   CHOLHDL 3.0 11/05/2021   CHOLHDL 2.4 07/08/2021   CHOLHDL 3.3 04/03/2021  No results found for: "LDLDIRECT" -Controlled -Current treatment: Atorvastatin 486mAppropriate, Effective, Safe, Accessible -Medications previously tried: N/A  -Current dietary habits: "Tries to eat Healthy" -Current exercise habits: Does chores around the house -Educated on Cholesterol goals;  Over 15 minutes spent counseling patient on role of hyperlipidemia on heart disease and the impact of each lipid subclass with emphasis on LDL and heart disease risk.  Explained role of statins in prevention of CV disease complications and actual risk for adverse effects with statin treatment.  Counseled patient on genetic component placing them at risk for hyperlipidemia. -Recommended to continue current medication  Diabetes (A1c goal <7%) Lab Results  Component Value Date   HGBA1C 7.7 (H) 11/05/2021   HGBA1C 6.8 (H) 07/08/2021   HGBA1C 7.3 (H) 04/03/2021   Lab Results  Component Value Date   MICROALBUR 80 05/17/2019  LDLCALC 89 11/05/2021   CREATININE 0.83 11/05/2021   Lab Results  Component Value Date   NA 141 11/05/2021   K 4.5 11/05/2021   CREATININE 0.83 11/05/2021   EGFR 94 11/05/2021   GFRNONAA >60  05/20/2021   GLUCOSE 101 (H) 11/05/2021   Lab Results  Component Value Date   WBC 8.4 11/05/2021   HGB 15.7 11/05/2021   HCT 45.9 11/05/2021   MCV 91 11/05/2021   PLT 304 11/05/2021  -Controlled -Current medications: Farxiga 49m Appropriate, Effective, Safe, Query accessible Tresiba 30 units Appropriate, Effective, Safe, Query accessible Metformin 10054mAppropriate, Effective, Safe, Accessible -Medications previously tried: N/A  -Current home glucose readings fasting glucose: Didn't have logs on him post prandial glucose: Didn't have logs on him -Denies hypoglycemic/hyperglycemic symptoms -Current meal patterns: Patient didn't answer initial question and since his labs are good, I didn't repeat myself -Current exercise: Chores -Educated on A1c and blood sugar goals; Extensive time was spent counseling patient on the pathophysiology and risk for complications with uncontrolled Diabetes.  Used teach back method for counseling on ADA diabetic diet.  Time spent on explaining role of carbohydrates versus sugars impacting blood glucose levels and meal planning. Current exercise consists of walking 10 minutes three times a week, plan is to increase to 20 minutes 5 days a week and then after 2-3 weeks increase to 30 minute 5 days a week.  -Counseled to check feet daily and get yearly eye exams July 2023: PAP for Farxiga/Tresiba. Makes $45,000/year between him and wife August 2023: Patient found out he makes $65,000/year between him and his wife. He states he never got PAP paperwork in the mail but didn't think about calling. Will re-send out October 2023: Will re-send PAP again. States he hasn't gotten in the mail November 2023: Patient has paperwork, he just "forgot" to bring it in. Counseled extensively how A1c isn't your sugar that day, but the previous 3 months. If he's having Sx in March, his sugars need [place] be [place] and will bring in by end of the week.  Patient states he ran out of test strips a while ago and would like them sent to Pharmacy.   Depression/Anxiety -Patient has lost 2 of his sons, said, "If I didn't take this medicine my mind would just race and I wouldn't be able to focus or sleep or anything" -Controlled -Current treatment: Alprazolam BID Appropriate, Effective, Safe, Accessible -Medications previously tried/failed: N/A -PHQ9:     04/03/2021    7:43 AM 06/20/2020    9:07 AM 04/24/2020    2:29 PM  Depression screen PHQ 2/9  Decreased Interest 0 0 0  Down, Depressed, Hopeless 0 0 0  PHQ - 2 Score 0 0 0  -GAD7:      No data to display        -Educated on Benefits of medication for symptom control October 2022: Patient could try preventative therapy (SSRI/SNRI/Etc) so he doesn't have to take BNZ as often, patient wasn't open to changing any meds today. July 2023: Same as before  Pain Controlled Pain Scale: Without Meds  -October 2022: 8/10 With Meds: -October 2022: 3-4/10 (Is able to do chores, just is slower) -Current medications: Diclofenac Appropriate, Effective, Safe, Accessible Oxycodone/APAP 10/325 Appropriate, Effective, Safe, Accessible -Medications previously tried: N/A July 2023: Patient's priority was cost of meds. Unable to do pain scale but he did say that post knee Sx in May, he feels significantly better November 2023: Patient's first knee  feels great. Has appt scheduled for March 2024 for the other knee    Patient Goals/Self-Care Activities Patient will:  - take medications as prescribed  Follow Up Plan: Face to Face appointment with care management team member scheduled for:  December 2023  [place] Constable, Sherian Rein.D. - [phone]       Clayton James was given information about Chronic Care Management services today including:  CCM service includes personalized support from designated clinical staff supervised by his physician, including individualized plan of care and coordination with  other care providers 24/7 contact phone numbers for assistance for urgent and routine care needs. Standard insurance, coinsurance, copays and deductibles apply for chronic care management only during months in which we provide at least 20 minutes of these services. Most insurances cover these services at 100%, however patients may be responsible for any copay, coinsurance and/or deductible if applicable. This service may help you avoid the need for more expensive face-to-face services. Only one practitioner may furnish and bill the service in a calendar month. The patient may stop CCM services at any time (effective at the end of the month) by phone call to the office staff.  Patient agreed to services and verbal consent obtained.   The patient verbalized understanding of instructions, educational materials, and care plan provided today and DECLINED offer to receive copy of patient instructions, educational materials, and care plan.  The pharmacy team will reach out to the patient again over the next 60 days.   Lane Hacker, [place]

## 2021-12-17 NOTE — Progress Notes (Signed)
Chronic Care Management Pharmacy Note  12/17/2021 Name:  Clayton James MRN:  [id number] DOB:  09-Jan-1951  Summary: -Pleasant 70 year old male presents for initial CCM visit. 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 While doing this, 2 of his sons died and he decided to retire. 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  -He hunts deer every year. He's excited for the new season, he does both bow and rifle and is teaching his grandson how -Isn't going bow hunting this year (2023) but he hopes to have a deer by our next call in November  -Update: Got his 8 pointer this month  Recommendations/Changes made from today's visit: -PAP for Tresiba/Farxiga. Patient will bring in by end of the week -Sent msg to Dr. Tobie Poet regarding patient needing AWV and BP taken before end of the year    Subjective: Clayton James is an 71 y.o. year old male who is a primary patient of Henrene Pastor, Zeb Comfort, MD.  The CCM team was consulted for assistance with disease management and care coordination needs.    Engaged with patient by telephone for follow up visit in response to provider referral for pharmacy case management and/or care coordination services.   Consent to Services:  The patient was given the following information about Chronic Care Management services today, agreed to services, and gave verbal consent: 1. CCM service includes personalized support from designated clinical staff supervised by the primary care provider, including individualized plan of care and coordination with other care providers 2. 24/7 contact phone numbers for assistance for urgent and routine care needs. 3. Service will only be billed when office clinical staff spend 20 minutes or more in a month to coordinate care. 4. Only one  practitioner may furnish and bill the service in a calendar month. 5.The patient may stop CCM services at any time (effective at the end of the month) by phone call to the office staff. 6. The patient will be responsible for cost sharing (co-pay) of up to 20% of the service fee (after annual deductible is met). Patient agreed to services and consent obtained.  Patient Care Team: Lillard Anes, MD as PCP - General (Family Medicine) Lane Hacker, [facility] (Pharmacist)  Recent office visits:  04-03-2021 Lillard Anes, MD. Albumin/Globulin= 2.3. A1C= 7.3. LDL= 117   Recent consult visits:  05-26-2021 Leandrew Koyanagi, MD (Neurosurgery). Right total knee arthroplasty procedure completed.    05-20-2021 Pre-op testing   Hospital visits:  None in previous 6 months   Objective:  Lab Results  Component Value Date   CREATININE 0.83 11/05/2021   BUN 17 11/05/2021   GFRNONAA >60 05/20/2021   GFRAA 109 02/15/2020   NA 141 11/05/2021   K 4.5 11/05/2021   CALCIUM 9.8 11/05/2021   CO2 20 11/05/2021   GLUCOSE 101 (H) 11/05/2021    Lab Results  Component Value Date/Time   HGBA1C 7.7 (H) 11/05/2021 10:49 AM   HGBA1C 6.8 (H) 07/08/2021 02:18 PM   MICROALBUR 80 05/17/2019 09:03 AM    Last diabetic Eye exam:  Lab Results  Component Value Date/Time   HMDIABEYEEXA No Retinopathy 07/04/2020 12:00 AM    Last diabetic Foot exam: No results found for: "HMDIABFOOTEX"   Lab Results  Component Value Date  CHOL 157 11/05/2021   HDL 52 11/05/2021   LDLCALC 89 11/05/2021   TRIG 86 11/05/2021   CHOLHDL 3.0 11/05/2021       Latest Ref Rng & Units 11/05/2021   10:49 AM 07/08/2021    2:18 PM 05/20/2021   11:21 AM  Hepatic Function  Total Protein 6.0 - 8.5 g/dL 6.8  6.8  7.1   Albumin 3.8 - 4.8 g/dL 4.4  4.5  4.2   AST 0 - 40 IU/L _0 ALT 0 - 44 IU/L _1 Alk Phosphatase 44 - 121 IU/L 86  82  75   Total Bilirubin 0.0 - 1.2 mg/dL 0.6  0.8  0.7     No results  found for: "TSH", "FREET4"     Latest Ref Rng & Units 11/05/2021   10:49 AM 07/08/2021    2:18 PM 05/27/2021    5:59 AM  CBC  WBC 3.4 - 10.8 x10E3/uL 8.4  6.7  10.1   Hemoglobin 13.0 - 17.7 g/dL 15.7  14.4  13.6   Hematocrit 37.5 - 51.0 % 45.9  42.4  40.4   Platelets 150 - 450 x10E3/uL 304  326  228     No results found for: "VD25OH"  Clinical ASCVD: Yes  The 10-year ASCVD risk score (Arnett DK, et al., 2019) is: 43.1%   Values used to calculate the score:     Age: 5 years     Sex: Male     Is Non-Hispanic African American: No     Diabetic: Yes     Tobacco smoker: No     Systolic Blood Pressure: 678 mmHg     Is BP treated: Yes     HDL Cholesterol: 52 mg/dL     Total Cholesterol: 157 mg/dL       04/03/2021    7:43 AM 06/20/2020    9:07 AM 04/24/2020    2:29 PM  Depression screen PHQ 2/9  Decreased Interest 0 0 0  Down, Depressed, Hopeless 0 0 0  PHQ - 2 Score 0 0 0     Other: (CHADS2VASc if Afib, MMRC or CAT for COPD, ACT, DEXA)  Social History   Tobacco Use  Smoking Status Former   Packs/day: 0.50   Years: 8.00   Total pack years: 4.00   Types: Cigarettes   Quit date: 2000   Years since quitting: 23.9  Smokeless Tobacco Current   Types: Chew  Tobacco Comments   1 can per week   BP Readings from Last 3 Encounters:  11/05/21 (!) 150/90  07/08/21 120/60  05/27/21 140/82   Pulse Readings from Last 3 Encounters:  11/05/21 71  07/08/21 87  05/27/21 75   Wt Readings from Last 3 Encounters:  11/05/21 192 lb (87.1 kg)  07/08/21 185 lb (83.9 kg)  05/26/21 190 lb (86.2 kg)   BMI Readings from Last 3 Encounters:  11/05/21 26.78 kg/m  07/08/21 25.80 kg/m  05/26/21 26.50 kg/m    Assessment/Interventions: Review of patient past medical history, allergies, medications, health status, including review of consultants reports, laboratory and other test data, was performed as part of comprehensive evaluation and provision of chronic care management services.    SDOH:  (Social Determinants of Health) assessments and interventions performed: Yes SDOH Interventions    Flowsheet Row Chronic Care Management from 12/17/2021 in [place] Management from 09/19/2021 in [place] Management from 08/07/2021  in [place] Management from 10/21/2020 in [employer] from 04/24/2020 in [place] Interventions       Food Insecurity Interventions -- -- -- -- Intervention Not Indicated  Housing Interventions -- -- -- -- Intervention Not Indicated  Transportation Interventions _0   Financial Strain Interventions Other (Comment)  [PAP] Other (Comment)  [PAP] Other (Comment)  [(Farxiga/Tresiba PAP)] Other (Comment) Other (Comment)  [patient assistance applications started for diabetes medication]      SDOH Screenings   Food Insecurity: No Food Insecurity (04/24/2020)  Housing: Low Risk  (04/24/2020)  Transportation Needs: No Transportation Needs (12/17/2021)  Alcohol Screen: Low Risk  (06/20/2020)  Depression (PHQ2-9): Low Risk  (04/03/2021)  Financial Resource Strain: High Risk (12/17/2021)  Tobacco Use: High Risk (11/05/2021)    [place]  Allergies  Allergen Reactions   Sulfamethoxazole Rash    Medications Reviewed Today     Reviewed by Lane Hacker, [facility] (Pharmacist) on 12/17/21 at [place] List Status: <None>   Medication Order Taking? Sig Documenting Provider Last Dose Status Informant  ALPRAZolam (XANAX) 0.5 MG tablet [id number]  Take 1 tablet by mouth twice daily as needed Lillard Anes, MD  Active   amLODipine-benazepril (LOTREL) 10-20 MG capsule [id number] No Take 1 capsule by mouth once daily Cox, Elnita Maxwell, MD Taking Active   aspirin EC 81 MG tablet [id number] No Take 1 tablet (81 mg total) by mouth 2 (two)  times daily. To be taken after surgery to prevent blood clots Aundra Dubin, PA-C Taking Active   atorvastatin (LIPITOR) 40 MG tablet [id number] No Take 1 tablet (40 mg total) by mouth daily. Lillard Anes, MD Taking Active Self  Blood Glucose Monitoring Suppl (ONE TOUCH ULTRA 2) w/Device Drucie Opitz [id number] No USE TO CHECK GLUCOSE THREE TIMES DAILY [provider] Taking Active Self  dapagliflozin propanediol (FARXIGA) 10 MG TABS tablet [id number] No Take 1 tablet (10 mg total) by mouth daily. Lillard Anes, MD Taking Active   diclofenac Sodium (VOLTAREN) 1 % GEL [id number] No Apply 2 g topically daily as needed (pain). [provider] Taking Active Self  HYDROcodone-acetaminophen (NORCO) 10-325 MG tablet [id number] No Take 1 tablet by mouth 2 (two) times daily as needed. Lillard Anes, MD Taking Active   Insulin Pen Needle (BD PEN NEEDLE MICRO U/F) 32G X 6 MM MISC [id number] No USE AS DIRECTED ONCE DAILY Lillard Anes, MD Taking Active Self  metFORMIN (GLUCOPHAGE) 1000 MG tablet [id number]  Take 1 tablet by mouth twice daily Cox, Kirsten, MD  Active   methocarbamol (ROBAXIN-750) 750 MG tablet [id number] No Take 1 tablet (750 mg total) by mouth 2 (two) times daily as needed for muscle spasms. Aundra Dubin, PA-C Taking Active   tamsulosin (FLOMAX) 0.4 MG CAPS capsule [id number] No Take 1 capsule (0.4 mg total) by mouth daily. Lillard Anes, MD Taking Active   TRESIBA FLEXTOUCH 100 UNIT/ML FlexTouch Pen [id number] No INJECT 48 UNITS SUBCUTANEOUSLY ONCE DAILY Lillard Anes, MD Taking Active   Med List Note Erie Noe, LPN 50/53/97 6734): Patient approved for Farxiga Patient Assistance from [place] 06/23/20 through 01/18/21            Patient Active Problem List   Diagnosis Date Noted   Primary osteoarthritis of right knee 05/26/2021   Status post total right knee replacement 05/26/2021  BPH (benign prostatic hyperplasia)  03/20/2021   Rotator cuff tear, right 12/02/2020   Pigmented nevus 04/18/2020   BMI 25.0-25.9,adult 10/04/2019   PVD (peripheral vascular disease) ([place]) 08/28/2019   Osteoarthritis of left knee 05/04/2019   Mixed hyperlipidemia 04/27/2019   Chronic kidney disease, stage 3a ([place]) 04/27/2019   Type 2 diabetes mellitus with diabetic polyneuropathy ([place]) 04/27/2019   Type 2 diabetes mellitus with stage 3 chronic kidney disease ([place]) 04/27/2019   Benign essential hypertension 04/27/2019   Osteoarthritis of right knee 04/27/2019    Immunization History  Administered Date(s) Administered   Fluad Quad(high Dose 65+) 12/02/2020, 11/05/2021, 11/05/2021   Influenza-Unspecified 11/16/2018   PFIZER Comirnaty(Gray Top)Covid-19 Tri-Sucrose Vaccine 03/30/2019, 04/25/2019   Pneumococcal Polysaccharide-23 04/24/2020    Conditions to be addressed/monitored:  Hypertension, Hyperlipidemia, Diabetes, and Anxiety  Care Plan : [place]  Updates made by Lane Hacker, [place] since 12/17/2021 12:00 AM     Problem: DM, HTN, Mental health, Lipids   Priority: High  Onset Date: 10/21/2020     Long-Range Goal: Disease State Management   Start Date: 10/21/2020  Expected End Date: 10/21/2021  Recent Progress: On track  Priority: High  Note:   Current Barriers:  Does not maintain contact with provider office Does not contact provider office for questions/concerns  Pharmacist Clinical Goal(s):  Patient will contact provider office for questions/concerns as evidenced notation of same in electronic health record through collaboration with PharmD and provider.   Interventions: 1:1 collaboration with Lillard Anes, MD regarding development and update of comprehensive plan of care as evidenced by provider attestation and co-signature Inter-disciplinary care team collaboration (see longitudinal plan of care) Comprehensive medication review performed; medication list updated in  electronic medical record  Hypertension (BP goal <140/90) BP Readings from Last 3 Encounters:  11/05/21 (!) 150/90  07/08/21 120/60  05/27/21 140/82  -Controlled -Current treatment: Amlodipine/Benazapril 10/45m QD Appropriate, Effective, Safe, Accessible -Medications previously tried: N/A  -Current home readings: Doesn't test -Current dietary habits: "Tries to eat Healthy" -Current exercise habits: Does chores around the house -Denies hypotensive/hypertensive symptoms -Educated on BP goals and benefits of medications for prevention of heart attack, stroke and kidney damage; Extensive time spent on counseling patient on blood pressure goal and impact of each antihypertensive medication on their blood pressure and risk reduction for CV disease.  Used analogies to explain the need for multiple antihypertensive medications to achieve BP goals and that it is often a silent disease with no symptoms.  -Counseled to monitor BP at home PRN, document, and provide log at future appointments November 2023: Sent msg to PCP asking for AWV so we can get BP reading and fix his BP score for 2023. If still elevated, recommend treatment  Hyperlipidemia: (LDL goal < 100) Lab Results  Component Value Date   CHOL 157 11/05/2021   CHOL 146 07/08/2021   CHOL 198 04/03/2021   Lab Results  Component Value Date   HDL 52 11/05/2021   HDL 62 07/08/2021   HDL 60 04/03/2021   Lab Results  Component Value Date   LDLCALC 89 11/05/2021   LDLCALC 67 07/08/2021   LDLCALC 117 (H) 04/03/2021   Lab Results  Component Value Date   TRIG 86 11/05/2021   TRIG 88 07/08/2021   TRIG 117 04/03/2021   Lab Results  Component Value Date   CHOLHDL 3.0 11/05/2021   CHOLHDL 2.4 07/08/2021   CHOLHDL 3.3 04/03/2021  No results found for: "LDLDIRECT" -Controlled -Current treatment: Atorvastatin  76m Appropriate, Effective, Safe, Accessible -Medications previously tried: N/A  -Current dietary habits: "Tries to eat  Healthy" -Current exercise habits: Does chores around the house -Educated on Cholesterol goals;  Over 15 minutes spent counseling patient on role of hyperlipidemia on heart disease and the impact of each lipid subclass with emphasis on LDL and heart disease risk.  Explained role of statins in prevention of CV disease complications and actual risk for adverse effects with statin treatment.  Counseled patient on genetic component placing them at risk for hyperlipidemia. -Recommended to continue current medication  Diabetes (A1c goal <7%) Lab Results  Component Value Date   HGBA1C 7.7 (H) 11/05/2021   HGBA1C 6.8 (H) 07/08/2021   HGBA1C 7.3 (H) 04/03/2021   Lab Results  Component Value Date   MICROALBUR 80 05/17/2019   LDLCALC 89 11/05/2021   CREATININE 0.83 11/05/2021   Lab Results  Component Value Date   NA 141 11/05/2021   K 4.5 11/05/2021   CREATININE 0.83 11/05/2021   EGFR 94 11/05/2021   GFRNONAA >60 05/20/2021   GLUCOSE 101 (H) 11/05/2021   Lab Results  Component Value Date   WBC 8.4 11/05/2021   HGB 15.7 11/05/2021   HCT 45.9 11/05/2021   MCV 91 11/05/2021   PLT 304 11/05/2021  -Controlled -Current medications: Farxiga 116mAppropriate, Effective, Safe, Query accessible Tresiba 30 units Appropriate, Effective, Safe, Query accessible Metformin 100080mppropriate, Effective, Safe, Accessible -Medications previously tried: N/A  -Current home glucose readings fasting glucose: Didn't have logs on him post prandial glucose: Didn't have logs on him -Denies hypoglycemic/hyperglycemic symptoms -Current meal patterns: Patient didn't answer initial question and since his labs are good, I didn't repeat myself -Current exercise: Chores -Educated on A1c and blood sugar goals; Extensive time was spent counseling patient on the pathophysiology and risk for complications with uncontrolled Diabetes.  Used teach back method for counseling on ADA diabetic diet.  Time spent on explaining  role of carbohydrates versus sugars impacting blood glucose levels and meal planning. Current exercise consists of walking 10 minutes three times a week, plan is to increase to 20 minutes 5 days a week and then after 2-3 weeks increase to 30 minute 5 days a week.  -Counseled to check feet daily and get yearly eye exams July 2023: PAP for Farxiga/Tresiba. Makes $45,000/year between him and wife August 2023: Patient found out he makes $65,000/year between him and his wife. He states he never got PAP paperwork in the mail but didn't think about calling. Will re-send out October 2023: Will re-send PAP again. States he hasn't gotten in the mail November 2023: Patient has paperwork, he just "forgot" to bring it in. Counseled extensively how A1c isn't your sugar that day, but the previous 3 months. If he's having Sx in March, his sugars need to be great from DecMetze affirmed and will bring in by end of the week. Patient states he ran out of test strips a while ago and would like them sent to Pharmacy.   Depression/Anxiety -Patient has lost 2 of his sons, said, "If I didn't take this medicine my mind would just race and I wouldn't be able to focus or sleep or anything" -Controlled -Current treatment: Alprazolam BID Appropriate, Effective, Safe, Accessible -Medications previously tried/failed: N/A -PHQ9:     04/03/2021    7:43 AM 06/20/2020    9:07 AM 04/24/2020    2:29 PM  Depression screen PHQ 2/9  Decreased Interest 0 0 0  Down, Depressed, Hopeless 0  0 0  PHQ - 2 Score 0 0 0  -GAD7:      No data to display        -Educated on Benefits of medication for symptom control October 2022: Patient could try preventative therapy (SSRI/SNRI/Etc) so he doesn't have to take BNZ as often, patient wasn't open to changing any meds today. July 2023: Same as before  Pain Controlled Pain Scale: Without Meds  -October 2022: 8/10 With Meds: -October 2022: 3-4/10 (Is able to do chores, just is  slower) -Current medications: Diclofenac Appropriate, Effective, Safe, Accessible Oxycodone/APAP 10/325 Appropriate, Effective, Safe, Accessible -Medications previously tried: N/A July 2023: Patient's priority was cost of meds. Unable to do pain scale but he did say that post knee Sx in May, he feels significantly better November 2023: Patient's first knee feels great. Has appt scheduled for March 2024 for the other knee    Patient Goals/Self-Care Activities Patient will:  - take medications as prescribed  Follow Up Plan: Face to Face appointment with care management team member scheduled for:  December 2023  [place] Constable, Sherian Rein.D. - [phone]        Medication Assistance:   Wilder Glade -As of November 2023, patient has paperwork but hasn't brought to the office  Tyler Aas -As of November 2023, patient has paperwork but hasn't brought to the office  Compliance/Adherence/Medication fill history: Care Gaps: Last eye exam / Retinopathy Screening? 07-01-2020 Last Annual Wellness Visit? 04-24-2020 Last Diabetic Foot Exam? Patient states Dr. Henrene Pastor examines feet.   Star Rating Drugs: Farxiga 10 mg- Patient assistance Metformin 1000 mg- Last filled 05-27-2021 90 DS Atorvastatin 40 mg- Last filled 03-27-2021 90 DS Amlodipine-benazepril 10-20 mg- Last filled 04-17-2021 90 DS   Patient's preferred pharmacy is:  [facility] [address], [place] [address] [place] [address] Phone: [phone] Fax: [phone]  Uses pill box? No - Doesn't think he needs Pt endorses 100% compliance  Care Plan and Follow Up Patient Decision:  Patient agrees to Care Plan and Follow-up.  Plan: The patient has been provided with contact information for the care management team and has been advised to call with any health related questions or concerns.   [place] Constable, Pharm.D. - [phone]  F/U: December 2023

## 2021-12-18 DIAGNOSIS — E1142 Type 2 diabetes mellitus with diabetic polyneuropathy: Secondary | ICD-10-CM

## 2021-12-18 DIAGNOSIS — I10 Essential (primary) hypertension: Secondary | ICD-10-CM

## 2021-12-23 ENCOUNTER — Other Ambulatory Visit: Payer: Self-pay

## 2021-12-23 MED ORDER — GLUCOSE BLOOD VI STRP: 1.0000 | ORAL_STRIP | Freq: Every day | 2 refills | Status: AC

## 2021-12-25 DIAGNOSIS — E1121 Type 2 diabetes mellitus with diabetic nephropathy: Secondary | ICD-10-CM | POA: Diagnosis not present

## 2021-12-25 DIAGNOSIS — E1142 Type 2 diabetes mellitus with diabetic polyneuropathy: Secondary | ICD-10-CM | POA: Diagnosis not present

## 2021-12-30 ENCOUNTER — Ambulatory Visit (INDEPENDENT_AMBULATORY_CARE_PROVIDER_SITE_OTHER): Payer: Medicare HMO

## 2021-12-30 DIAGNOSIS — E1142 Type 2 diabetes mellitus with diabetic polyneuropathy: Secondary | ICD-10-CM

## 2021-12-30 DIAGNOSIS — I10 Essential (primary) hypertension: Secondary | ICD-10-CM

## 2021-12-30 DIAGNOSIS — E782 Mixed hyperlipidemia: Secondary | ICD-10-CM

## 2021-12-30 NOTE — Patient Instructions (Signed)
Visit Information   Goals Addressed   None    Patient Care Plan: CCM Pharmacy Care Plan     Problem Identified: DM, HTN, Mental health, Lipids   Priority: High  Onset Date: 10/21/2020     Long-Range Goal: Disease State Management   Start Date: 10/21/2020  Expected End Date: 10/21/2021  Recent Progress: On track  Priority: High  Note:   Current Barriers:  Does not maintain contact with provider office Does not contact provider office for questions/concerns  Pharmacist Clinical Goal(s):  Patient will contact provider office for questions/concerns as evidenced notation of same in electronic health record through collaboration with PharmD and provider.   Interventions: 1:1 collaboration with Lillard Anes, MD regarding development and update of comprehensive plan of care as evidenced by provider attestation and co-signature Inter-disciplinary care team collaboration (see longitudinal plan of care) Comprehensive medication review performed; medication list updated in electronic medical record  Hypertension (BP goal <140/90) BP Readings from Last 3 Encounters:  11/05/21 (!) 150/90  07/08/21 120/60  05/27/21 140/82  -Controlled -Current treatment: Amlodipine/Benazapril 10/65m QD Appropriate, Effective, Safe, Accessible -Medications previously tried: N/A  -Current home readings: Doesn't test -Current dietary habits: "Tries to eat Healthy" -Current exercise habits: Does chores around the house -Denies hypotensive/hypertensive symptoms -Educated on BP goals and benefits of medications for prevention of heart attack, stroke and kidney damage; Extensive time spent on counseling patient on blood pressure goal and impact of each antihypertensive medication on their blood pressure and risk reduction for CV disease.  Used analogies to explain the need for multiple antihypertensive medications to achieve BP goals and that it is often a silent disease with no symptoms.  -Counseled  to monitor BP at home PRN, document, and provide log at future appointments November 2023: Sent msg to PCP asking for AWV so we can get BP reading and fix his BP score for 2023. If still elevated, recommend treatment December 2023: AWV this week  Hyperlipidemia: (LDL goal < 100) Lab Results  Component Value Date   CHOL 157 11/05/2021   CHOL 146 07/08/2021   CHOL 198 04/03/2021   Lab Results  Component Value Date   HDL 52 11/05/2021   HDL 62 07/08/2021   HDL 60 04/03/2021   Lab Results  Component Value Date   LDLCALC 89 11/05/2021   LDLCALC 67 07/08/2021   LDLCALC 117 (H) 04/03/2021   Lab Results  Component Value Date   TRIG 86 11/05/2021   TRIG 88 07/08/2021   TRIG 117 04/03/2021   Lab Results  Component Value Date   CHOLHDL 3.0 11/05/2021   CHOLHDL 2.4 07/08/2021   CHOLHDL 3.3 04/03/2021  No results found for: "LDLDIRECT" -Controlled -Current treatment: Atorvastatin 444mAppropriate, Effective, Safe, Accessible -Medications previously tried: N/A  -Current dietary habits: "Tries to eat Healthy" -Current exercise habits: Does chores around the house -Educated on Cholesterol goals;  Over 15 minutes spent counseling patient on role of hyperlipidemia on heart disease and the impact of each lipid subclass with emphasis on LDL and heart disease risk.  Explained role of statins in prevention of CV disease complications and actual risk for adverse effects with statin treatment.  Counseled patient on genetic component placing them at risk for hyperlipidemia. -Recommended to continue current medication  Diabetes (A1c goal <7%) Lab Results  Component Value Date   HGBA1C 7.7 (H) 11/05/2021   HGBA1C 6.8 (H) 07/08/2021   HGBA1C 7.3 (H) 04/03/2021   Lab Results  Component Value Date  MICROALBUR 80 05/17/2019   LDLCALC 89 11/05/2021   CREATININE 0.83 11/05/2021   Lab Results  Component Value Date   NA 141 11/05/2021   K 4.5 11/05/2021   CREATININE 0.83 11/05/2021   EGFR  94 11/05/2021   GFRNONAA >60 05/20/2021   GLUCOSE 101 (H) 11/05/2021   Lab Results  Component Value Date   WBC 8.4 11/05/2021   HGB 15.7 11/05/2021   HCT 45.9 11/05/2021   MCV 91 11/05/2021   PLT 304 11/05/2021  -Controlled -Current medications: Farxiga 93m Appropriate, Effective, Safe, Query accessible Tresiba 30 units Appropriate, Effective, Safe, Query accessible Metformin 10079mAppropriate, Effective, Safe, Accessible -Medications previously tried: N/A  -Current home glucose readings fasting glucose: Didn't have logs on him post prandial glucose: Didn't have logs on him -Denies hypoglycemic/hyperglycemic symptoms -Current meal patterns: Patient didn't answer initial question and since his labs are good, I didn't repeat myself -Current exercise: Chores -Educated on A1c and blood sugar goals; Extensive time was spent counseling patient on the pathophysiology and risk for complications with uncontrolled Diabetes.  Used teach back method for counseling on ADA diabetic diet.  Time spent on explaining role of carbohydrates versus sugars impacting blood glucose levels and meal planning. Current exercise consists of walking 10 minutes three times a week, plan is to increase to 20 minutes 5 days a week and then after 2-3 weeks increase to 30 minute 5 days a week.  -Counseled to check feet daily and get yearly eye exams July 2023: PAP for Farxiga/Tresiba. Makes $45,000/year between him and wife August 2023: Patient found out he makes $65,000/year between him and his wife. He states he never got PAP paperwork in the mail but didn't think about calling. Will re-send out October 2023: Will re-send PAP again. States he hasn't gotten in the mail November 2023: Patient has paperwork, he just "forgot" to bring it in. Counseled extensively how A1c isn't your sugar that day, but the previous 3 months. If he's having Sx in March, his sugars [place] be [place] and [place]  bring in by end of the week. Patient states he ran out of test strips a while ago and would like them sent to Pharmacy.  December 2023: Patient still hasn't brought in PAP paperwork. Will bring it in this week  Depression/Anxiety -Patient has lost 2 of his sons, said, "If I didn't take this medicine my mind would just race and I wouldn't be able to focus or sleep or anything" -Controlled -Current treatment: Alprazolam BID Appropriate, Effective, Safe, Accessible -Medications previously tried/failed: N/A -PHQ9:     04/03/2021    7:43 AM 06/20/2020    9:07 AM 04/24/2020    2:29 PM  Depression screen PHQ 2/9  Decreased Interest 0 0 0  Down, Depressed, Hopeless 0 0 0  PHQ - 2 Score 0 0 0  -GAD7:      No data to display        -Educated on Benefits of medication for symptom control October 2022: Patient could try preventative therapy (SSRI/SNRI/Etc) so he doesn't have to take BNZ as often, patient wasn't open to changing any meds today. July 2023: Same as before  Pain Controlled Pain Scale: Without Meds  -October 2022: 8/10 With Meds: -October 2022: 3-4/10 (Is able to do chores, just is slower) -Current medications: Diclofenac Appropriate, Effective, Safe, Accessible Oxycodone/APAP 10/325 Appropriate, Effective, Safe, Accessible -Medications previously tried: N/A July 2023: Patient's priority was cost of meds. Unable to do pain  scale but he did say that post knee Sx in May, he feels significantly better November 2023: Patient's first knee feels great. Has appt scheduled for March 2024 for the other knee    Patient Goals/Self-Care Activities Patient will:  - take medications as prescribed  Follow Up Plan: Face to Face appointment with care management team member scheduled for:  Jan 2024  [place] Constable, Pharm.D. - [phone]       Mr. Sandt was given information about Chronic Care Management services today including:  CCM service includes personalized support from  designated clinical staff supervised by his physician, including individualized plan of care and coordination with other care providers 24/7 contact phone numbers for assistance for urgent and routine care needs. Standard insurance, coinsurance, copays and deductibles apply for chronic care management only during months in which we provide at least 20 minutes of these services. Most insurances cover these services at 100%, however patients may be responsible for any copay, coinsurance and/or deductible if applicable. This service may help you avoid the need for more expensive face-to-face services. Only one practitioner may furnish and bill the service in a calendar month. The patient may stop CCM services at any time (effective at the end of the month) by phone call to the office staff.  Patient agreed to services and verbal consent obtained.   The patient verbalized understanding of instructions, educational materials, and care plan provided today and DECLINED offer to receive copy of patient instructions, educational materials, and care plan.  The pharmacy team will reach out to the patient again over the next 60 days.   Lane Hacker, [place]

## 2021-12-30 NOTE — Progress Notes (Signed)
Chronic Care Management Pharmacy Note  12/30/2021 Name:  Clayton James MRN:  [id number] DOB:  11-29-1950  Summary: -Pleasant 71 year old male presents for initial CCM visit. 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 While doing this, 2 of his sons died and he decided to retire. 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  -He hunts deer every year. He's excited for the new season, he does both bow and rifle and is teaching his grandson how -Isn't going bow hunting this year (2023) but he hopes to have a deer by our next call in November  -Update: Got his 8 pointer this month  Recommendations/Changes made from today's visit: -PAP for Tresiba/Farxiga. Patient will bring in by end of the week    Subjective: Clayton James is an 71 y.o. year old male who is a primary patient of Henrene Pastor, Zeb Comfort, MD.  The CCM team was consulted for assistance with disease management and care coordination needs.    Engaged with patient by telephone for follow up visit in response to provider referral for pharmacy case management and/or care coordination services.   Consent to Services:  The patient was given the following information about Chronic Care Management services today, agreed to services, and gave verbal consent: 1. CCM service includes personalized support from designated clinical staff supervised by the primary care provider, including individualized plan of care and coordination with other care providers 2. 24/7 contact phone numbers for assistance for urgent and routine care needs. 3. Service will only be billed when office clinical staff spend 20 minutes or more in a month to coordinate care. 4. Only one practitioner may furnish and bill the service in a calendar month. 5.The patient may stop  CCM services at any time (effective at the end of the month) by phone call to the office staff. 6. The patient will be responsible for cost sharing (co-pay) of up to 20% of the service fee (after annual deductible is met). Patient agreed to services and consent obtained.  Patient Care Team: Lillard Anes, MD as PCP - General (Family Medicine) Lane Hacker, [facility] (Pharmacist)  Recent office visits:  04-03-2021 Lillard Anes, MD. Albumin/Globulin= 2.3. A1C= 7.3. LDL= 117   Recent consult visits:  05-26-2021 Leandrew Koyanagi, MD (Neurosurgery). Right total knee arthroplasty procedure completed.    05-20-2021 Pre-op testing   Hospital visits:  None in previous 6 months   Objective:  Lab Results  Component Value Date   CREATININE 0.83 11/05/2021   BUN 17 11/05/2021   GFRNONAA >60 05/20/2021   GFRAA 109 02/15/2020   NA 141 11/05/2021   K 4.5 11/05/2021   CALCIUM 9.8 11/05/2021   CO2 20 11/05/2021   GLUCOSE 101 (H) 11/05/2021    Lab Results  Component Value Date/Time   HGBA1C 7.7 (H) 11/05/2021 10:49 AM   HGBA1C 6.8 (H) 07/08/2021 02:18 PM   MICROALBUR 80 05/17/2019 09:03 AM    Last diabetic Eye exam:  Lab Results  Component Value Date/Time   HMDIABEYEEXA No Retinopathy 07/04/2020 12:00 AM    Last diabetic Foot exam: No results found for: "HMDIABFOOTEX"   Lab Results  Component Value Date   CHOL 157 11/05/2021   HDL 52 11/05/2021   LDLCALC 89 11/05/2021  TRIG 86 11/05/2021   CHOLHDL 3.0 11/05/2021       Latest Ref Rng & Units 11/05/2021   10:49 AM 07/08/2021    2:18 PM 05/20/2021   11:21 AM  Hepatic Function  Total Protein 6.0 - 8.5 g/dL 6.8  6.8  7.1   Albumin 3.8 - 4.8 g/dL 4.4  4.5  4.2   AST 0 - 40 IU/L _0 ALT 0 - 44 IU/L _1 Alk Phosphatase 44 - 121 IU/L 86  82  75   Total Bilirubin 0.0 - 1.2 mg/dL 0.6  0.8  0.7     No results found for: "TSH", "FREET4"     Latest Ref Rng & Units 11/05/2021   10:49 AM 07/08/2021     2:18 PM 05/27/2021    5:59 AM  CBC  WBC 3.4 - 10.8 x10E3/uL 8.4  6.7  10.1   Hemoglobin 13.0 - 17.7 g/dL 15.7  14.4  13.6   Hematocrit 37.5 - 51.0 % 45.9  42.4  40.4   Platelets 150 - 450 x10E3/uL 304  326  228     No results found for: "VD25OH"  Clinical ASCVD: Yes  The 10-year ASCVD risk score (Arnett DK, et al., 2019) is: 43.1%   Values used to calculate the score:     Age: 71 years     Sex: Male     Is Non-Hispanic African American: No     Diabetic: Yes     Tobacco smoker: No     Systolic Blood Pressure: 921 mmHg     Is BP treated: Yes     HDL Cholesterol: 52 mg/dL     Total Cholesterol: 157 mg/dL       04/03/2021    7:43 AM 06/20/2020    9:07 AM 04/24/2020    2:29 PM  Depression screen PHQ 2/9  Decreased Interest 0 0 0  Down, Depressed, Hopeless 0 0 0  PHQ - 2 Score 0 0 0     Other: (CHADS2VASc if Afib, MMRC or CAT for COPD, ACT, DEXA)  Social History   Tobacco Use  Smoking Status Former   Packs/day: 0.50   Years: 8.00   Total pack years: 4.00   Types: Cigarettes   Quit date: 2000   Years since quitting: 23.9  Smokeless Tobacco Current   Types: Chew  Tobacco Comments   1 can per week   BP Readings from Last 3 Encounters:  11/05/21 (!) 150/90  07/08/21 120/60  05/27/21 140/82   Pulse Readings from Last 3 Encounters:  11/05/21 71  07/08/21 87  05/27/21 75   Wt Readings from Last 3 Encounters:  11/05/21 192 lb (87.1 kg)  07/08/21 185 lb (83.9 kg)  05/26/21 190 lb (86.2 kg)   BMI Readings from Last 3 Encounters:  11/05/21 26.78 kg/m  07/08/21 25.80 kg/m  05/26/21 26.50 kg/m    Assessment/Interventions: Review of patient past medical history, allergies, medications, health status, including review of consultants reports, laboratory and other test data, was performed as part of comprehensive evaluation and provision of chronic care management services.   SDOH:  (Social Determinants of Health) assessments and interventions performed: Yes SDOH  Interventions    Flowsheet Row Chronic Care Management from 12/30/2021 in [place] Management from 12/17/2021 in [place] Management from 09/19/2021 in [place] Management from 08/07/2021 in [place]  Management from 10/21/2020 in [place] from 04/24/2020 in [place] Interventions        Food Insecurity Interventions -- -- -- -- -- Intervention Not Indicated  Housing Interventions -- -- -- -- -- Intervention Not Indicated  Transportation Interventions -- _0   Financial Strain Interventions Other (Comment)  [PAP] Other (Comment)  [PAP] Other (Comment)  [PAP] Other (Comment)  [(Farxiga/Tresiba PAP)] Other (Comment) Other (Comment)  [patient assistance applications started for diabetes medication]      SDOH Screenings   Food Insecurity: No Food Insecurity (04/24/2020)  Housing: Low Risk  (04/24/2020)  Transportation Needs: No Transportation Needs (12/17/2021)  Alcohol Screen: Low Risk  (06/20/2020)  Depression (PHQ2-9): Low Risk  (04/03/2021)  Financial Resource Strain: High Risk (12/30/2021)  Tobacco Use: High Risk (11/05/2021)    [place]  Allergies  Allergen Reactions   Sulfamethoxazole Rash    Medications Reviewed Today     Reviewed by Lane Hacker, [facility] (Pharmacist) on 12/30/21 at [phone]  Med List Status: <None>   Medication Order Taking? Sig Documenting Provider Last Dose Status Informant  ALPRAZolam (XANAX) 0.5 MG tablet [id number]  Take 1 tablet by mouth twice daily as needed Lillard Anes, MD  Active   amLODipine-benazepril (LOTREL) 10-20 MG capsule [id number] No Take 1 capsule by mouth once daily Cox, Elnita Maxwell, MD Taking Active   aspirin EC 81 MG tablet [id number] No Take 1 tablet (81 mg total) by mouth 2  (two) times daily. To be taken after surgery to prevent blood clots Aundra Dubin, PA-C Taking Active   atorvastatin (LIPITOR) 40 MG tablet [id number] No Take 1 tablet (40 mg total) by mouth daily. Lillard Anes, MD Taking Active Self  Blood Glucose Monitoring Suppl (ONE TOUCH ULTRA 2) w/Device Drucie Opitz [id number] No USE TO CHECK GLUCOSE THREE TIMES DAILY [provider] Taking Active Self  dapagliflozin propanediol (FARXIGA) 10 MG TABS tablet [id number] No Take 1 tablet (10 mg total) by mouth daily. Lillard Anes, MD Taking Active   diclofenac Sodium (VOLTAREN) 1 % GEL [id number] No Apply 2 g topically daily as needed (pain). [provider] Taking Active Self  glucose blood test strip [id number]  1 each by Other route daily. Use as instructed Lillard Anes, MD  Active   HYDROcodone-acetaminophen [facility]) 10-325 MG tablet [id number] No Take 1 tablet by mouth 2 (two) times daily as needed. Lillard Anes, MD Taking Active   Insulin Pen Needle (BD PEN NEEDLE MICRO U/F) 32G X 6 MM MISC [id number] No USE AS DIRECTED ONCE DAILY Lillard Anes, MD Taking Active Self  metFORMIN (GLUCOPHAGE) 1000 MG tablet [id number]  Take 1 tablet by mouth twice daily Cox, Kirsten, MD  Active   methocarbamol (ROBAXIN-750) 750 MG tablet [id number] No Take 1 tablet (750 mg total) by mouth 2 (two) times daily as needed for muscle spasms. Aundra Dubin, PA-C Taking Active   tamsulosin (FLOMAX) 0.4 MG CAPS capsule [id number] No Take 1 capsule (0.4 mg total) by mouth daily. Lillard Anes, MD Taking Active   [facility] 100 UNIT/ML FlexTouch Pen [id number] No INJECT 83 UNITS SUBCUTANEOUSLY ONCE DAILY Lillard Anes, MD Taking Active   Med List Note Erie Noe, LPN 40/97/35 3299): Patient approved for Farxiga Patient Assistance from [place] 06/23/20 through 01/18/21            Patient Active Problem  List   Diagnosis Date Noted   Primary  osteoarthritis of right knee 05/26/2021   Status post total right knee replacement 05/26/2021   BPH (benign prostatic hyperplasia) 03/20/2021   Rotator cuff tear, right 12/02/2020   Pigmented nevus 04/18/2020   BMI 25.0-25.9,adult 10/04/2019   PVD (peripheral vascular disease) ([place]) 08/28/2019   Osteoarthritis of left knee 05/04/2019   Mixed hyperlipidemia 04/27/2019   Chronic kidney disease, stage 3a ([place]) 04/27/2019   Type 2 diabetes mellitus with diabetic polyneuropathy ([place]) 04/27/2019   Type 2 diabetes mellitus with stage 3 chronic kidney disease ([place]) 04/27/2019   Benign essential hypertension 04/27/2019   Osteoarthritis of right knee 04/27/2019    Immunization History  Administered Date(s) Administered   Fluad Quad(high Dose 65+) 12/02/2020, 11/05/2021, 11/05/2021   Influenza-Unspecified 11/16/2018   PFIZER Comirnaty([place])Covid-19 Tri-Sucrose Vaccine 03/30/2019, 04/25/2019   Pneumococcal Polysaccharide-23 04/24/2020    Conditions to be addressed/monitored:  Hypertension, Hyperlipidemia, Diabetes, and Anxiety  Care Plan : [place]  Updates made by Lane Hacker, [place] since 12/30/2021 12:00 AM     Problem: DM, HTN, Mental health, Lipids   Priority: High  Onset Date: 10/21/2020     Long-Range Goal: Disease State Management   Start Date: 10/21/2020  Expected End Date: 10/21/2021  Recent Progress: On track  Priority: High  Note:   Current Barriers:  Does not maintain contact with provider office Does not contact provider office for questions/concerns  Pharmacist Clinical Goal(s):  Patient will contact provider office for questions/concerns as evidenced notation of same in electronic health record through collaboration with PharmD and provider.   Interventions: 1:1 collaboration with Lillard Anes, MD regarding development and update of comprehensive plan of care as evidenced by provider attestation and co-signature Inter-disciplinary  care team collaboration (see longitudinal plan of care) Comprehensive medication review performed; medication list updated in electronic medical record  Hypertension (BP goal <140/90) BP Readings from Last 3 Encounters:  11/05/21 (!) 150/90  07/08/21 120/60  05/27/21 140/82  -Controlled -Current treatment: Amlodipine/Benazapril 10/3m QD Appropriate, Effective, Safe, Accessible -Medications previously tried: N/A  -Current home readings: Doesn't test -Current dietary habits: "Tries to eat Healthy" -Current exercise habits: Does chores around the house -Denies hypotensive/hypertensive symptoms -Educated on BP goals and benefits of medications for prevention of heart attack, stroke and kidney damage; Extensive time spent on counseling patient on blood pressure goal and impact of each antihypertensive medication on their blood pressure and risk reduction for CV disease.  Used analogies to explain the need for multiple antihypertensive medications to achieve BP goals and that it is often a silent disease with no symptoms.  -Counseled to monitor BP at home PRN, document, and provide log at future appointments November 2023: Sent msg to PCP asking for AWV so we can get BP reading and fix his BP score for 2023. If still elevated, recommend treatment December 2023: AWV this week  Hyperlipidemia: (LDL goal < 100) Lab Results  Component Value Date   CHOL 157 11/05/2021   CHOL 146 07/08/2021   CHOL 198 04/03/2021   Lab Results  Component Value Date   HDL 52 11/05/2021   HDL 62 07/08/2021   HDL 60 04/03/2021   Lab Results  Component Value Date   LDLCALC 89 11/05/2021   LDLCALC 67 07/08/2021   LDLCALC 117 (H) 04/03/2021   Lab Results  Component Value Date   TRIG 86 11/05/2021   TRIG 88 07/08/2021   TRIG 117 04/03/2021   Lab  Results  Component Value Date   CHOLHDL 3.0 11/05/2021   CHOLHDL 2.4 07/08/2021   CHOLHDL 3.3 04/03/2021  No results found for:  "LDLDIRECT" -Controlled -Current treatment: Atorvastatin 99m Appropriate, Effective, Safe, Accessible -Medications previously tried: N/A  -Current dietary habits: "Tries to eat Healthy" -Current exercise habits: Does chores around the house -Educated on Cholesterol goals;  Over 15 minutes spent counseling patient on role of hyperlipidemia on heart disease and the impact of each lipid subclass with emphasis on LDL and heart disease risk.  Explained role of statins in prevention of CV disease complications and actual risk for adverse effects with statin treatment.  Counseled patient on genetic component placing them at risk for hyperlipidemia. -Recommended to continue current medication  Diabetes (A1c goal <7%) Lab Results  Component Value Date   HGBA1C 7.7 (H) 11/05/2021   HGBA1C 6.8 (H) 07/08/2021   HGBA1C 7.3 (H) 04/03/2021   Lab Results  Component Value Date   MICROALBUR 80 05/17/2019   LDLCALC 89 11/05/2021   CREATININE 0.83 11/05/2021   Lab Results  Component Value Date   NA 141 11/05/2021   K 4.5 11/05/2021   CREATININE 0.83 11/05/2021   EGFR 94 11/05/2021   GFRNONAA >60 05/20/2021   GLUCOSE 101 (H) 11/05/2021   Lab Results  Component Value Date   WBC 8.4 11/05/2021   HGB 15.7 11/05/2021   HCT 45.9 11/05/2021   MCV 91 11/05/2021   PLT 304 11/05/2021  -Controlled -Current medications: Farxiga 160mAppropriate, Effective, Safe, Query accessible Tresiba 30 units Appropriate, Effective, Safe, Query accessible Metformin 100051mppropriate, Effective, Safe, Accessible -Medications previously tried: N/A  -Current home glucose readings fasting glucose: Didn't have logs on him post prandial glucose: Didn't have logs on him -Denies hypoglycemic/hyperglycemic symptoms -Current meal patterns: Patient didn't answer initial question and since his labs are good, I didn't repeat myself -Current exercise: Chores -Educated on A1c and blood sugar goals; Extensive time was spent  counseling patient on the pathophysiology and risk for complications with uncontrolled Diabetes.  Used teach back method for counseling on ADA diabetic diet.  Time spent on explaining role of carbohydrates versus sugars impacting blood glucose levels and meal planning. Current exercise consists of walking 10 minutes three times a week, plan is to increase to 20 minutes 5 days a week and then after 2-3 weeks increase to 30 minute 5 days a week.  -Counseled to check feet daily and get yearly eye exams July 2023: PAP for Farxiga/Tresiba. Makes $45,000/year between him and wife August 2023: Patient found out he makes $65,000/year between him and his wife. He states he never got PAP paperwork in the mail but didn't think about calling. Will re-send out October 2023: Will re-send PAP again. States he hasn't gotten in the mail November 2023: Patient has paperwork, he just "forgot" to bring it in. Counseled extensively how A1c isn't your sugar that day, but the previous 3 months. If he's having Sx in March, his sugars need to be great from [place] affirmed and will bring in by end of the week. Patient states he ran out of test strips a while ago and would like them sent to Pharmacy.  December 2023: Patient still hasn't brought in PAP paperwork. Will bring it in this week  Depression/Anxiety -Patient has lost 2 of his sons, said, "If I didn't take this medicine my mind would just race and I wouldn't be able to focus or sleep or anything" -Controlled -Current treatment: Alprazolam BID Appropriate, Effective,  Safe, Accessible -Medications previously tried/failed: N/A -PHQ9:     04/03/2021    7:43 AM 06/20/2020    9:07 AM 04/24/2020    2:29 PM  Depression screen PHQ 2/9  Decreased Interest 0 0 0  Down, Depressed, Hopeless 0 0 0  PHQ - 2 Score 0 0 0  -GAD7:      No data to display        -Educated on Benefits of medication for symptom control October 2022: Patient could try preventative  therapy (SSRI/SNRI/Etc) so he doesn't have to take BNZ as often, patient wasn't open to changing any meds today. July 2023: Same as before  Pain Controlled Pain Scale: Without Meds  -October 2022: 8/10 With Meds: -October 2022: 3-4/10 (Is able to do chores, just is slower) -Current medications: Diclofenac Appropriate, Effective, Safe, Accessible Oxycodone/APAP 10/325 Appropriate, Effective, Safe, Accessible -Medications previously tried: N/A July 2023: Patient's priority was cost of meds. Unable to do pain scale but he did say that post knee Sx in May, he feels significantly better November 2023: Patient's first knee feels great. Has appt scheduled for March 2024 for the other knee    Patient Goals/Self-Care Activities Patient will:  - take medications as prescribed  Follow Up Plan: Face to Face appointment with care management team member scheduled for:  Jan 2024  [place] Constable, Pharm.D. - [phone]        Medication Assistance:   Wilder Glade -As of December 2023, patient has paperwork but hasn't brought to the office  Tyler Aas -As of December 2023, patient has paperwork but hasn't brought to the office  Compliance/Adherence/Medication fill history: Care Gaps: Last eye exam / Retinopathy Screening? 07-01-2020 Last Annual Wellness Visit? 04-24-2020 Last Diabetic Foot Exam? Patient states Dr. Henrene Pastor examines feet.   Star Rating Drugs: Farxiga 10 mg- Patient assistance Metformin 1000 mg- Last filled 05-27-2021 90 DS Atorvastatin 40 mg- Last filled 03-27-2021 90 DS Amlodipine-benazepril 10-20 mg- Last filled 04-17-2021 90 DS   Patient's preferred pharmacy is:  [facility] [address], [place] [address] [place] [address] Phone: [phone] Fax: [phone]  Uses pill box? No - Doesn't think he needs Pt endorses 100% compliance  Care Plan and Follow Up Patient Decision:  Patient agrees to Care Plan and Follow-up.  Plan:  The patient has been provided with contact information for the care management team and has been advised to call with any health related questions or concerns.   [place] Constable, Pharm.D. - [phone]  F/U: Jan 2024

## 2022-01-01 ENCOUNTER — Ambulatory Visit (INDEPENDENT_AMBULATORY_CARE_PROVIDER_SITE_OTHER): Payer: Medicare HMO

## 2022-01-01 VITALS — BP 138/80 | HR 80 | Resp 16 | Ht 70.75 in | Wt 194.8 lb

## 2022-01-01 DIAGNOSIS — Z0000 Encounter for general adult medical examination without abnormal findings: Secondary | ICD-10-CM

## 2022-01-01 DIAGNOSIS — Z23 Encounter for immunization: Secondary | ICD-10-CM

## 2022-01-01 NOTE — Patient Instructions (Signed)
Clayton James , Thank you for taking time to come for your Medicare Wellness Visit. I appreciate your ongoing commitment to your health goals. Please review the following plan we discussed and let me know if I can assist you in the future.   Screening recommendations/referrals: Colonoscopy: Requested records from Dr Lyda Jester Recommended yearly ophthalmology/optometry visit for glaucoma screening and checkup Recommended yearly dental visit for hygiene and checkup  Vaccinations: Influenza vaccine: up-to-date Pneumococcal vaccine: Complete Tdap vaccine: Due - you can get this at the pharmacy Shingles vaccine: Due - you can get this at the pharmacy     Preventive Care 65 Years and Older, Male Preventive care refers to lifestyle choices and visits with your health care provider that can promote health and wellness. What does preventive care include? A yearly physical exam. This is also called an annual well check. Dental exams once or twice a year. Routine eye exams. Ask your health care provider how often you should have your eyes checked. Personal lifestyle choices, including: Daily care of your teeth and gums. Regular physical activity. Eating a healthy diet. Avoiding tobacco and drug use. Limiting alcohol use. Practicing safe sex. Taking low doses of aspirin every day. Taking vitamin and mineral supplements as recommended by your health care provider. What happens during an annual well check? The services and screenings done by your health care provider during your annual well check will depend on your age, overall health, lifestyle risk factors, and family history of disease. Counseling  Your health care provider may ask you questions about your: Alcohol use. Tobacco use. Drug use. Emotional well-being. Home and relationship well-being. Sexual activity. Eating habits. History of falls. Memory and ability to understand (cognition). Work and work Statistician. Screening  You  may have the following tests or measurements: Height, weight, and BMI. Blood pressure. Lipid and cholesterol levels. These may be checked every 5 years, or more frequently if you are over 19 years old. Skin check. Lung cancer screening. You may have this screening every year starting at age 40 if you have a 30-pack-year history of smoking and currently smoke or have quit within the past 15 years. Fecal occult blood test (FOBT) of the stool. You may have this test every year starting at age 65. Flexible sigmoidoscopy or colonoscopy. You may have a sigmoidoscopy every 5 years or a colonoscopy every 10 years starting at age 74. Prostate cancer screening. Recommendations will vary depending on your family history and other risks. Hepatitis C blood test. Hepatitis B blood test. Sexually transmitted disease (STD) testing. Diabetes screening. This is done by checking your blood sugar (glucose) after you have not eaten for a while (fasting). You may have this done every 1-3 years. Abdominal aortic aneurysm (AAA) screening. You may need this if you are a current or former smoker. Osteoporosis. You may be screened starting at age 2 if you are at high risk. Talk with your health care provider about your test results, treatment options, and if necessary, the need for more tests. Vaccines  Your health care provider may recommend certain vaccines, such as: Influenza vaccine. This is recommended every year. Tetanus, diphtheria, and acellular pertussis (Tdap, Td) vaccine. You may need a Td booster every 10 years. Zoster vaccine. You may need this after age 71. Pneumococcal 13-valent conjugate (PCV13) vaccine. One dose is recommended after age 62. Pneumococcal polysaccharide (PPSV23) vaccine. One dose is recommended after age 71. Talk to your health care provider about which screenings and vaccines you need and how  often you need them. This information is not intended to replace advice given to you by your  health care provider. Make sure you discuss any questions you have with your health care provider. Document Released: 02/01/2015 Document Revised: 09/25/2015 Document Reviewed: 11/06/2014 Elsevier Interactive Patient Education  2017 [place] Prevention in the Home Falls can cause injuries. They can happen to people of all ages. There are many things you can do to make your home safe and to help prevent falls. What can I do on the outside of my home? Regularly fix the edges of walkways and driveways and fix any cracks. Remove anything that might make you trip as you walk through a door, such as a raised step or threshold. Trim any bushes or trees on the path to your home. Use bright outdoor lighting. Clear any walking paths of anything that might make someone trip, such as rocks or tools. Regularly check to see if handrails are loose or broken. Make sure that both sides of any steps have handrails. Any raised decks and porches should have guardrails on the edges. Have any leaves, snow, or ice cleared regularly. Use sand or salt on walking paths during winter. Clean up any spills in your garage right away. This includes oil or grease spills. What can I do in the bathroom? Use night lights. Install grab bars by the toilet and in the tub and shower. Do not use towel bars as grab bars. Use non-skid mats or decals in the tub or shower. If you need to sit down in the shower, use a plastic, non-slip stool. Keep the floor dry. Clean up any water that spills on the floor as soon as it happens. Remove soap buildup in the tub or shower regularly. Attach bath mats securely with double-sided non-slip rug tape. Do not have throw rugs and other things on the floor that can make you trip. What can I do in the bedroom? Use night lights. Make sure that you have a light by your bed that is easy to reach. Do not use any sheets or blankets that are too big for your bed. They should not hang down  onto the floor. Have a firm chair that has side arms. You can use this for support while you get dressed. Do not have throw rugs and other things on the floor that can make you trip. What can I do in the kitchen? Clean up any spills right away. Avoid walking on wet floors. Keep items that you use a lot in easy-to-reach places. If you need to reach something above you, use a strong step stool that has a grab bar. Keep electrical cords out of the way. Do not use floor polish or wax that makes floors slippery. If you must use wax, use non-skid floor wax. Do not have throw rugs and other things on the floor that can make you trip. What can I do with my stairs? Do not leave any items on the stairs. Make sure that there are handrails on both sides of the stairs and use them. Fix handrails that are broken or loose. Make sure that handrails are as long as the stairways. Check any carpeting to make sure that it is firmly attached to the stairs. Fix any carpet that is loose or worn. Avoid having throw rugs at the top or bottom of the stairs. If you do have throw rugs, attach them to the floor with carpet tape. Make sure that you have a  light switch at the top of the stairs and the bottom of the stairs. If you do not have them, ask someone to add them for you. What else can I do to help prevent falls? Wear shoes that: Do not have high heels. Have rubber bottoms. Are comfortable and fit you well. Are closed at the toe. Do not wear sandals. If you use a stepladder: Make sure that it is fully opened. Do not climb a closed stepladder. Make sure that both sides of the stepladder are locked into place. Ask someone to hold it for you, if possible. Clearly mark and make sure that you can see: Any grab bars or handrails. First and last steps. Where the edge of each step is. Use tools that help you move around (mobility aids) if they are needed. These include: Canes. Walkers. Scooters. Crutches. Turn  on the lights when you go into a dark area. Replace any light bulbs as soon as they burn out. Set up your furniture so you have a clear path. Avoid moving your furniture around. If any of your floors are uneven, fix them. If there are any pets around you, be aware of where they are. Review your medicines with your doctor. Some medicines can make you feel dizzy. This can increase your chance of falling. Ask your doctor what other things that you can do to help prevent falls. This information is not intended to replace advice given to you by your health care provider. Make sure you discuss any questions you have with your health care provider. Document Released: 11/01/2008 Document Revised: 06/13/2015 Document Reviewed: 02/09/2014 Elsevier Interactive Patient Education  2017 [employer].

## 2022-01-01 NOTE — Progress Notes (Signed)
Subjective:   Clayton James is a 71 y.o. male who presents for Medicare Annual/Subsequent preventive examination.  This wellness visit is conducted by a nurse.  The patient's medications were reviewed and reconciled since the patient's last visit.  History details were provided by the patient.  The history appears to be reliable.    Medical History: Patient history and Family history was reviewed  Medications, Allergies, and preventative health maintenance was reviewed and updated.  Cardiac Risk Factors include: advanced age (>28mn, >>62women);diabetes mellitus;dyslipidemia;hypertension;male gender     Objective:    Today's Vitals   01/01/22 1255 01/01/22 1257  BP: 138/80   Pulse: 80   Resp: 16   SpO2: 96%   Weight: 194 lb 12.8 oz (88.4 kg)   Height: 5' 10.75" (1.797 m)   PainSc:  4    Body mass index is 27.36 kg/m.     05/26/2021    8:23 AM 05/20/2021   11:11 AM 04/24/2020    2:32 PM  Advanced Directives  Does Patient Have a Medical Advance Directive? No No Yes  Type of Advance Directive   [place] Does patient want to make changes to medical advance directive?   No - Patient declined  [place] of [place]?   No - copy requested  Would patient like information on creating a medical advance directive? No - Patient declined No - Patient declined     Current Medications (verified) Outpatient Encounter Medications as of 01/01/2022  Medication Sig   ALPRAZolam (XANAX) 0.5 MG tablet Take 1 tablet by mouth twice daily as needed   amLODipine-benazepril (LOTREL) 10-20 MG capsule Take 1 capsule by mouth once daily   aspirin EC 81 MG tablet Take 1 tablet (81 mg total) by mouth 2 (two) times daily. To be taken after surgery to prevent blood clots   atorvastatin (LIPITOR) 40 MG tablet Take 1 tablet (40 mg total) by mouth daily.   Blood Glucose Monitoring Suppl (ONE TOUCH ULTRA 2) w/Device KIT USE TO CHECK GLUCOSE THREE TIMES DAILY    dapagliflozin propanediol (FARXIGA) 10 MG TABS tablet Take 1 tablet (10 mg total) by mouth daily.   diclofenac Sodium (VOLTAREN) 1 % GEL Apply 2 g topically daily as needed (pain).   glucose blood test strip 1 each by Other route daily. Use as instructed   HYDROcodone-acetaminophen (NORCO) 10-325 MG tablet Take 1 tablet by mouth 2 (two) times daily as needed.   Insulin Pen Needle (BD PEN NEEDLE MICRO U/F) 32G X 6 MM MISC USE AS DIRECTED ONCE DAILY   metFORMIN (GLUCOPHAGE) 1000 MG tablet Take 1 tablet by mouth twice daily   tamsulosin (FLOMAX) 0.4 MG CAPS capsule Take 1 capsule (0.4 mg total) by mouth daily.   TRESIBA FLEXTOUCH 100 UNIT/ML FlexTouch Pen INJECT 30 UNITS SUBCUTANEOUSLY ONCE DAILY   methocarbamol (ROBAXIN-750) 750 MG tablet Take 1 tablet (750 mg total) by mouth 2 (two) times daily as needed for muscle spasms. (Patient not taking: Reported on 01/01/2022)   No facility-administered encounter medications on file as of 01/01/2022.    Allergies (verified) Sulfamethoxazole   History: Past Medical History:  Diagnosis Date   Arthritis    Benign essential hypertension 04/27/2019   Chronic kidney disease, stage 3a ([place] 04/27/2019   Mixed hyperlipidemia 04/27/2019   Type 2 diabetes mellitus with diabetic polyneuropathy ([place] 04/27/2019   Type 2 diabetes mellitus with stage 3 chronic kidney disease ([facility] 04/27/2019   Past Surgical  History:  Procedure Laterality Date   ELBOW SURGERY Left 1994   KNEE ARTHROSCOPY Right 1998   KNEE CARTILAGE SURGERY Left 1994   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   Procedure: RIGHT TOTAL KNEE ARTHROPLASTY;  Surgeon: Leandrew Koyanagi, MD;  Location: [facility];  Service: Orthopedics;  Laterality: Right;   Family History  Problem Relation Age of Onset   Diabetes Mother    Kidney disease Mother    Heart disease Father    Social History   Socioeconomic History   Marital status: Married    Spouse name: Scientist, water quality   Number of children: 3   Years of  education: Not on file   Highest education level: Not on file  Occupational History   Not on file  Tobacco Use   Smoking status: Former    Packs/day: 0.50    Years: 8.00    Total pack years: 4.00    Types: Cigarettes    Quit date: 2000    Years since quitting: 23.9   Smokeless tobacco: Current    Types: Chew   Tobacco comments:    1 can per week  Vaping Use   Vaping Use: Never used  Substance and Sexual Activity   Alcohol use: Not Currently   Drug use: Never   Sexual activity: Yes    Partners: Female  Other Topics Concern   Not on file  Social History Narrative   Oldest son and youngest son passed away   Social Determinants of Health   Financial Resource Strain: High Risk (12/30/2021)   Overall Financial Resource Strain (CARDIA)    Difficulty of Paying Living Expenses: Hard  Food Insecurity: No Food Insecurity (01/01/2022)   Hunger Vital Sign    Worried About Running Out of Food in the Last Year: Never true    Ran Out of Food in the Last Year: Never true  Transportation Needs: No Transportation Needs (01/01/2022)   PRAPARE - Hydrologist (Medical): No    Lack of Transportation (Non-Medical): No  Physical Activity: Insufficiently Active (01/01/2022)   Exercise Vital Sign    Days of Exercise per Week: 3 days    Minutes of Exercise per Session: 30 min  Stress: No Stress Concern Present (01/01/2022)   [place]    Feeling of Stress : Not at all  Social Connections: Not on file    Tobacco Counseling Ready to quit: Not Answered Counseling given: Not Answered Tobacco comments: 1 can per week   Clinical Intake:  Pre-visit preparation completed: Yes  Pain : 0-10 Pain Score: 4  Pain Location: Knee Pain Orientation: Left Pain Frequency: Constant Pain Relieving Factors: medication Pain Relieving Factors: medication BMI - recorded: 27.36 Nutritional Status: BMI 25 -29  Overweight Nutritional Risks: None Diabetes: Yes (most recent A1C 7.7) How often do you need to have someone help you when you read instructions, pamphlets, or other written materials from your doctor or pharmacy?: 1 - Never Interpreter Needed?: No     Activities of Daily Living    01/01/2022    1:03 PM 05/20/2021   11:19 AM  In your present state of health, do you have any difficulty performing the following activities:  Hearing? 1   Vision? 0   Difficulty concentrating or making decisions? 0   Walking or climbing stairs? 1   Dressing or bathing? 0   Doing errands, shopping? 0 0  Preparing Food and eating ? N  Using the Toilet? N   In the past six months, have you accidently leaked urine? N   Do you have problems with loss of bowel control? N   Managing your Medications? N   Managing your Finances? N   Housekeeping or managing your Housekeeping? N     Patient Care Team: Lillard Anes, MD as PCP - General (Family Medicine) Lane Hacker, [facility] (Pharmacist)     Assessment:   This is a routine wellness examination for Ayham.  Hearing/Vision screen No results found.  Dietary issues and exercise activities discussed: Current Exercise Habits: The patient does not participate in regular exercise at present, Exercise limited by: orthopedic condition(s)   Depression Screen    01/01/2022    1:01 PM 04/03/2021    7:43 AM 06/20/2020    9:07 AM 04/24/2020    2:29 PM 05/17/2019    8:20 AM 04/27/2019    2:38 PM  PHQ 2/9 Scores  PHQ - 2 Score 0 0 0 0 0 0    Fall Risk    01/01/2022    1:03 PM 04/03/2021    7:42 AM 06/20/2020    9:07 AM 04/24/2020   11:02 AM 08/14/2019   10:34 AM  [place] in the past year? 0 1 0 0 0  Number falls in past yr: 0 1 0 0 0  Injury with Fall? 0 0 0 0 0  Risk for fall due to : Impaired balance/gait  No Fall Risks No Fall Risks   Follow up Falls evaluation completed;Education provided  Falls evaluation completed Falls evaluation  completed;Falls prevention discussed;Education provided Falls evaluation completed    FALL RISK PREVENTION PERTAINING TO THE HOME:  Any stairs in or around the home? No  If so, are there any without handrails?  N/A Home free of loose throw rugs in walkways, pet beds, electrical cords, etc? Yes  Adequate lighting in your home to reduce risk of falls? Yes   ASSISTIVE DEVICES UTILIZED TO PREVENT FALLS:  Life alert? No  Use of a cane, walker or w/c? No  Grab bars in the bathroom? No  Shower chair or bench in shower? No  Elevated toilet seat or a handicapped toilet? No   Gait slow and steady without use of assistive device  Cognitive Function:        01/01/2022    1:04 PM 04/24/2020    2:36 PM  6CIT Screen  What Year? 0 points 0 points  What month? 0 points 0 points  What time? 0 points 0 points  Count back from 20 0 points 0 points  Months in reverse 0 points 0 points  Repeat phrase 0 points 0 points  Total Score 0 points 0 points    Immunizations Immunization History  Administered Date(s) Administered   Fluad Quad(high Dose 65+) 12/02/2020, 11/05/2021, 11/05/2021   Influenza-Unspecified 11/16/2018   PFIZER Comirnaty(Gray Top)Covid-19 Tri-Sucrose Vaccine 03/30/2019, 04/25/2019   PNEUMOCOCCAL CONJUGATE-20 01/01/2022   Pneumococcal Polysaccharide-23 04/24/2020    TDAP status: Due, Education has been provided regarding the importance of this vaccine. Advised may receive this vaccine at local pharmacy or Health Dept. Aware to provide a copy of the vaccination record if obtained from local pharmacy or Health Dept. Verbalized acceptance and understanding.  Flu Vaccine status: Up to date  Pneumococcal vaccine status: Completed during today's visit.  Covid-19 vaccine status: Declined, Education has been provided regarding the importance of this vaccine but patient still declined. Advised may receive this  vaccine at local pharmacy or Health Dept.or vaccine clinic. Aware to  provide a copy of the vaccination record if obtained from local pharmacy or Health Dept. Verbalized acceptance and understanding.  Qualifies for Shingles Vaccine? Yes   Zostavax completed No   Shingrix Completed?: No.    Education has been provided regarding the importance of this vaccine. Patient has been advised to call insurance company to determine out of pocket expense if they have not yet received this vaccine. Advised may also receive vaccine at local pharmacy or Health Dept. Verbalized acceptance and understanding.  Screening Tests Health Maintenance  Topic Date Due   DTaP/Tdap/Td (1 - Tdap) Never done   Zoster Vaccines- Shingrix (1 of 2) Never done   Medicare Annual Wellness (AWV)  04/24/2021   OPHTHALMOLOGY EXAM  07/04/2021   Diabetic kidney evaluation - Urine ACR  04/04/2022   HEMOGLOBIN A1C  05/07/2022   Diabetic kidney evaluation - eGFR measurement  11/06/2022   FOOT EXAM  11/06/2022   Fecal DNA (Cologuard)  06/25/2023   Pneumonia Vaccine 64+ Years old  Completed   INFLUENZA VACCINE  Completed   Hepatitis C Screening  Completed   HPV VACCINES  Aged Out   COLONOSCOPY (Pts 45-39yr Insurance coverage will need to be confirmed)  Discontinued   COVID-19 Vaccine  Discontinued    Health Maintenance  Health Maintenance Due  Topic Date Due   DTaP/Tdap/Td (1 - Tdap) Never done   Zoster Vaccines- Shingrix (1 of 2) Never done   Medicare Annual Wellness (AWV)  04/24/2021   OPHTHALMOLOGY EXAM  07/04/2021    Colorectal cancer screening: Type of screening: Colonoscopy. Completed a few years ago with Dr MLyda Jester I have requested the records.  Lung Cancer Screening: (Low Dose CT Chest recommended if Age 71-80years, 30 pack-year currently smoking OR have quit w/in 15years.) does not qualify.    Additional Screening:  Hepatitis C Screening: does qualify; Completed 11/05/21  Vision Screening: Recommended annual ophthalmology exams for early detection of glaucoma and other  disorders of the eye. Is the patient up to date with their annual eye exam?  No   Dental Screening: Recommended annual dental exams for proper oral hygiene  Community Resource Referral / Chronic Care Management: CRR required this visit?  No   CCM required this visit?  No      Plan:    1- Tetanus and Shingrix Vaccines due 2- Colonoscopy records requested from Dr MLyda Jester3- Diabetic eye exam due - patient will call to schedule  I have personally reviewed and noted the following in the patient's chart:   Medical and social history Use of alcohol, tobacco or illicit drugs  Current medications and supplements including opioid prescriptions. Patient is currently taking opioid prescriptions. Information provided to patient regarding non-opioid alternatives. Patient advised to discuss non-opioid treatment plan with their provider. Functional ability and status Nutritional status Physical activity Advanced directives List of other physicians Hospitalizations, surgeries, and ER visits in previous 12 months Vitals Screenings to include cognitive, depression, and falls Referrals and appointments  In addition, I have reviewed and discussed with patient certain preventive protocols, quality metrics, and best practice recommendations. A written personalized care plan for preventive services as well as general preventive health recommendations were provided to patient.     KErie Noe LPN   [id number]

## 2022-01-02 ENCOUNTER — Encounter: Payer: Self-pay | Admitting: Legal Medicine

## 2022-01-05 ENCOUNTER — Other Ambulatory Visit: Payer: Self-pay | Admitting: Legal Medicine

## 2022-01-05 DIAGNOSIS — E782 Mixed hyperlipidemia: Secondary | ICD-10-CM

## 2022-01-09 ENCOUNTER — Other Ambulatory Visit: Payer: Self-pay | Admitting: Legal Medicine

## 2022-01-09 DIAGNOSIS — M1712 Unilateral primary osteoarthritis, left knee: Secondary | ICD-10-CM

## 2022-01-09 DIAGNOSIS — F419 Anxiety disorder, unspecified: Secondary | ICD-10-CM

## 2022-01-09 MED ORDER — HYDROCODONE-ACETAMINOPHEN 10-325 MG PO TABS: 1.0000 | ORAL_TABLET | Freq: Two times a day (BID) | ORAL | 0 refills | Status: DC | PRN

## 2022-01-18 DIAGNOSIS — F32A Depression, unspecified: Secondary | ICD-10-CM

## 2022-01-18 DIAGNOSIS — R69 Illness, unspecified: Secondary | ICD-10-CM | POA: Diagnosis not present

## 2022-01-18 DIAGNOSIS — I10 Essential (primary) hypertension: Secondary | ICD-10-CM | POA: Diagnosis not present

## 2022-01-18 DIAGNOSIS — Z7985 Long-term (current) use of injectable non-insulin antidiabetic drugs: Secondary | ICD-10-CM

## 2022-01-18 DIAGNOSIS — E1159 Type 2 diabetes mellitus with other circulatory complications: Secondary | ICD-10-CM | POA: Diagnosis not present

## 2022-01-18 DIAGNOSIS — E785 Hyperlipidemia, unspecified: Secondary | ICD-10-CM | POA: Diagnosis not present

## 2022-01-23 ENCOUNTER — Telehealth: Payer: Self-pay

## 2022-01-23 NOTE — Progress Notes (Signed)
Care Management & Coordination Services Pharmacy Team  Reason for Encounter: Diabetes  Contacted patient on 01/23/2022 to discuss diabetes disease state.   Recent office visits:  01-01-2022 Erie Noe, LPN. Pneumococcal  vaccine given.  Recent consult visits:  None  Hospital visits:  None in previous 6 months  Medications: Outpatient Encounter Medications as of 01/23/2022  Medication Sig   ALPRAZolam (XANAX) 0.5 MG tablet Take 1 tablet by mouth twice daily as needed   amLODipine-benazepril (LOTREL) 10-20 MG capsule Take 1 capsule by mouth once daily   aspirin EC 81 MG tablet Take 1 tablet (81 mg total) by mouth 2 (two) times daily. To be taken after surgery to prevent blood clots   atorvastatin (LIPITOR) 40 MG tablet Take 1 tablet by mouth once daily   Blood Glucose Monitoring Suppl (ONE TOUCH ULTRA 2) w/Device KIT USE TO CHECK GLUCOSE THREE TIMES DAILY   dapagliflozin propanediol (FARXIGA) 10 MG TABS tablet Take 1 tablet (10 mg total) by mouth daily.   diclofenac Sodium (VOLTAREN) 1 % GEL Apply 2 g topically daily as needed (pain).   glucose blood test strip 1 each by Other route daily. Use as instructed   HYDROcodone-acetaminophen (NORCO) 10-325 MG tablet Take 1 tablet by mouth 2 (two) times daily as needed.   Insulin Pen Needle (BD PEN NEEDLE MICRO U/F) 32G X 6 MM MISC USE AS DIRECTED ONCE DAILY   metFORMIN (GLUCOPHAGE) 1000 MG tablet Take 1 tablet by mouth twice daily   methocarbamol (ROBAXIN-750) 750 MG tablet Take 1 tablet (750 mg total) by mouth 2 (two) times daily as needed for muscle spasms. (Patient not taking: Reported on 01/01/2022)   tamsulosin (FLOMAX) 0.4 MG CAPS capsule Take 1 capsule (0.4 mg total) by mouth daily.   TRESIBA FLEXTOUCH 100 UNIT/ML FlexTouch Pen INJECT 30 UNITS SUBCUTANEOUSLY ONCE DAILY   No facility-administered encounter medications on file as of 01/23/2022.    Recent Relevant Labs: Lab Results  Component Value Date/Time   HGBA1C 7.7 (H)  11/05/2021 10:49 AM   HGBA1C 6.8 (H) 07/08/2021 02:18 PM   MICROALBUR 80 05/17/2019 09:03 AM    Kidney Function Lab Results  Component Value Date/Time   CREATININE 0.83 11/05/2021 10:49 AM   CREATININE 0.74 (L) 07/08/2021 02:18 PM   GFRNONAA >60 05/20/2021 11:21 AM   GFRAA 109 02/15/2020 10:01 AM     01-23-2022: 1st attempt left VM 01-26-2022: 2nd attempt left VM 01-27-2022: 3rd attempt left VM   Adherence Review: Is the patient currently on a STATIN medication? Yes Is the patient currently on ACE/ARB medication? Yes Does the patient have >5 day gap between last estimated fill dates? No  Star Rating Drugs:  Atorvastatin 40 mg- Last filled 01-06-2022 90 DS. Previous 09-21-2021 90 DS Farxiga 10 mg- Last filled 01-04-2022 30 DS. Previous 12-01-2021 30 DS Lotrel 10-20 mg- Last filled 11-03-2021 90 DS. Previous 07-18-2021 90 DS    Malecca Ishmael Holter

## 2022-01-26 DIAGNOSIS — E1142 Type 2 diabetes mellitus with diabetic polyneuropathy: Secondary | ICD-10-CM | POA: Diagnosis not present

## 2022-01-26 DIAGNOSIS — E1121 Type 2 diabetes mellitus with diabetic nephropathy: Secondary | ICD-10-CM | POA: Diagnosis not present

## 2022-02-06 ENCOUNTER — Other Ambulatory Visit: Payer: Self-pay

## 2022-02-06 DIAGNOSIS — M1712 Unilateral primary osteoarthritis, left knee: Secondary | ICD-10-CM

## 2022-02-06 DIAGNOSIS — F419 Anxiety disorder, unspecified: Secondary | ICD-10-CM

## 2022-02-06 MED ORDER — HYDROCODONE-ACETAMINOPHEN 10-325 MG PO TABS: 1.0000 | ORAL_TABLET | Freq: Two times a day (BID) | ORAL | 0 refills | Status: DC | PRN

## 2022-02-06 MED ORDER — ALPRAZOLAM 0.5 MG PO TABS: 0.5000 mg | ORAL_TABLET | Freq: Two times a day (BID) | ORAL | 0 refills | Status: DC | PRN

## 2022-02-07 ENCOUNTER — Other Ambulatory Visit: Payer: Self-pay | Admitting: Family Medicine

## 2022-02-07 DIAGNOSIS — I10 Essential (primary) hypertension: Secondary | ICD-10-CM

## 2022-02-11 ENCOUNTER — Ambulatory Visit: Payer: Medicare HMO

## 2022-02-11 NOTE — Patient Outreach (Signed)
Care Management & Coordination Services Pharmacy Note  02/11/2022 Name:  Clayton James MRN:  [id number] DOB:  [birth year]  Summary: -Pleasant 72 year old male presents for initial CCM visit. 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 While doing this, 2 of his sons died and he decided to retire. 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  -He hunts deer every year. He's excited for the new season, he does both bow and rifle and is teaching his grandson how -Isn't going bow hunting this year (2023) but he hopes to have a deer by our next call in November             -Update: Got his 8 pointer in November  Recommendations/Changes made from today's visit: -None, patient still hasn't brought in PAP paperwork from months ago  Subjective: Clayton James is an 72 y.o. year old male who is a primary patient of Lillard Anes, MD (Inactive).  The care coordination team was consulted for assistance with disease management and care coordination needs.    Engaged with patient by telephone for follow up visit.  Recent office visits:  01-01-2022 Erie Noe, LPN. Pneumococcal  vaccine given.   Recent consult visits:  None   Hospital visits:  None in previous 6 months   Objective:  Lab Results  Component Value Date   CREATININE 0.83 11/05/2021   BUN 17 11/05/2021   EGFR 94 11/05/2021   GFRNONAA >60 05/20/2021   GFRAA 109 02/15/2020   NA 141 11/05/2021   K 4.5 11/05/2021   CALCIUM 9.8 11/05/2021   CO2 20 11/05/2021   GLUCOSE 101 (H) 11/05/2021    Lab Results  Component Value Date/Time   HGBA1C 7.7 (H) 11/05/2021 10:49 AM   HGBA1C 6.8 (H) 07/08/2021 02:18 PM   MICROALBUR 80 05/17/2019 09:03 AM    Last diabetic Eye exam:  Lab Results  Component Value Date/Time    HMDIABEYEEXA No Retinopathy 07/04/2020 12:00 AM    Last diabetic Foot exam: No results found for: "HMDIABFOOTEX"   Lab Results  Component Value Date   CHOL 157 11/05/2021   HDL 52 11/05/2021   LDLCALC 89 11/05/2021   TRIG 86 11/05/2021   CHOLHDL 3.0 11/05/2021       Latest Ref Rng & Units 11/05/2021   10:49 AM 07/08/2021    2:18 PM 05/20/2021   11:21 AM  Hepatic Function  Total Protein 6.0 - 8.5 g/dL 6.8  6.8  7.1   Albumin 3.8 - 4.8 g/dL 4.4  4.5  4.2   AST 0 - 40 IU/L '12  14  19   '$ ALT 0 - 44 IU/L '15  13  24   '$ Alk Phosphatase 44 - 121 IU/L 86  82  75   Total Bilirubin 0.0 - 1.2 mg/dL 0.6  0.8  0.7     No results found for: "TSH", "FREET4"     Latest Ref Rng & Units 11/05/2021   10:49 AM 07/08/2021    2:18 PM 05/27/2021    5:59 AM  CBC  WBC 3.4 - 10.8 x10E3/uL 8.4  6.7  10.1   Hemoglobin 13.0 - 17.7 g/dL 15.7  14.4  13.6   Hematocrit 37.5 - 51.0 % 45.9  42.4  40.4   Platelets 150 -  450 x10E3/uL 304  326  228     No results found for: "VD25OH", "VITAMINB12"  Clinical ASCVD: Yes  The 10-year ASCVD risk score (Arnett DK, et al., 2019) is: 38.4%   Values used to calculate the score:     Age: 72 years     Sex: Male     Is Non-Hispanic African American: No     Diabetic: Yes     Tobacco smoker: No     Systolic Blood Pressure: 505 mmHg     Is BP treated: Yes     HDL Cholesterol: 52 mg/dL     Total Cholesterol: 157 mg/dL    Other: (CHADS2VASc if Afib, MMRC or CAT for COPD, ACT, DEXA)     01/01/2022    1:01 PM 04/03/2021    7:43 AM 06/20/2020    9:07 AM  Depression screen PHQ 2/9  Decreased Interest 0 0 0  Down, Depressed, Hopeless 0 0 0  PHQ - 2 Score 0 0 0     Social History   Tobacco Use  Smoking Status Former   Packs/day: 0.50   Years: 8.00   Total pack years: 4.00   Types: Cigarettes   Quit date: 2000   Years since quitting: 24.0  Smokeless Tobacco Current   Types: Chew  Tobacco Comments   1 can per week   BP Readings from Last 3 Encounters:   01/01/22 138/80  11/05/21 (!) 150/90  07/08/21 120/60   Pulse Readings from Last 3 Encounters:  01/01/22 80  11/05/21 71  07/08/21 87   Wt Readings from Last 3 Encounters:  01/01/22 194 lb 12.8 oz (88.4 kg)  11/05/21 192 lb (87.1 kg)  07/08/21 185 lb (83.9 kg)   BMI Readings from Last 3 Encounters:  01/01/22 27.36 kg/m  11/05/21 26.78 kg/m  07/08/21 25.80 kg/m    Allergies  Allergen Reactions   Sulfamethoxazole Rash    Medications Reviewed Today     Reviewed by Erie Noe, LPN (Licensed Practical Nurse) on 01/01/22 at 15  Med List Status: <None>   Medication Order Taking? Sig Documenting Provider Last Dose Status Informant  ALPRAZolam (XANAX) 0.5 MG tablet [id number] Yes Take 1 tablet by mouth twice daily as needed Lillard Anes, MD Taking Active   amLODipine-benazepril (LOTREL) 10-20 MG capsule [id number] Yes Take 1 capsule by mouth once daily Cox, Elnita Maxwell, MD Taking Active   aspirin EC 81 MG tablet [id number] Yes Take 1 tablet (81 mg total) by mouth 2 (two) times daily. To be taken after surgery to prevent blood clots Aundra Dubin, PA-C Taking Active   atorvastatin (LIPITOR) 40 MG tablet [id number] Yes Take 1 tablet (40 mg total) by mouth daily. Lillard Anes, MD Taking Active Self  Blood Glucose Monitoring Suppl (ONE TOUCH ULTRA 2) w/Device Drucie Opitz [id number] Yes USE TO CHECK GLUCOSE THREE TIMES DAILY [provider] Taking Active Self  dapagliflozin propanediol (FARXIGA) 10 MG TABS tablet [id number] Yes Take 1 tablet (10 mg total) by mouth daily. Lillard Anes, MD Taking Active   diclofenac Sodium (VOLTAREN) 1 % GEL [id number] Yes Apply 2 g topically daily as needed (pain). [provider] Taking Active Self  glucose blood test strip [id number] Yes 1 each by Other route daily. Use as instructed Lillard Anes, MD Taking Active   HYDROcodone-acetaminophen [facility]) 10-325 MG tablet [id number] Yes Take 1 tablet by mouth 2  (two) times daily as needed. Lillard Anes, MD Taking Active   Insulin Pen  Needle (BD PEN NEEDLE MICRO U/F) 32G X 6 MM MISC [id number] Yes USE AS DIRECTED ONCE DAILY Lillard Anes, MD Taking Active Self  metFORMIN (GLUCOPHAGE) 1000 MG tablet [id number] Yes Take 1 tablet by mouth twice daily Cox, Kirsten, MD Taking Active   methocarbamol (ROBAXIN-750) 750 MG tablet [id number] No Take 1 tablet (750 mg total) by mouth 2 (two) times daily as needed for muscle spasms.  Patient not taking: Reported on 01/01/2022   Aundra Dubin, PA-C Not Taking Active   tamsulosin [facility]) 0.4 MG CAPS capsule [id number] Yes Take 1 capsule (0.4 mg total) by mouth daily. Lillard Anes, MD Taking Active   TRESIBA FLEXTOUCH 100 UNIT/ML FlexTouch Pen [id number] Yes INJECT 30 UNITS SUBCUTANEOUSLY ONCE DAILY Lillard Anes, MD Taking Active   Med List Note Erie Noe, LPN 16/38/45 3646): Patient approved for [place] Patient Assistance from [place] 06/23/20 through 01/18/21            SDOH:  (Social Determinants of Health) assessments and interventions performed: Yes SDOH Interventions    Flowsheet Row Care Coordination from 02/11/2022 in [place] from 01/01/2022 in [place] [facility] from 12/30/2021 in [place] [facility] from 12/17/2021 in [place] [facility] from 09/19/2021 in [place] [facility] from 08/07/2021 in [place]  SDOH Interventions        Food Insecurity Interventions -- Intervention Not Indicated -- -- -- --  Housing Interventions -- Intervention Not Indicated -- -- -- --  Transportation Interventions Intervention Not Indicated Intervention Not Indicated -- Intervention Not Indicated Intervention Not Indicated Intervention Not Indicated  Utilities Interventions -- Intervention  Not Indicated -- -- -- --  Alcohol Usage Interventions -- Intervention Not Indicated (Score <7) -- -- -- --  Financial Strain Interventions Patient Refused -- Other (Comment)  [PAP] Other (Comment)  [PAP] Other (Comment)  [PAP] Other (Comment)  [(Farxiga/Tresiba PAP)]  Stress Interventions -- Intervention Not Indicated -- -- -- --       Medication Assistance:   Tyler Aas: Shari Heritage mailed to him in summer 2023. Patient hasn't brought to office (Jan 2024)  Wilder Glade: Shari Heritage mailed to him in summer 2023. Patient hasn't brought to office (Jan 2024)  Name and location of Current pharmacy:  [facility] [address] [place] [address] [place] [address] Phone: [phone] Fax: [phone]   Compliance/Adherence/Medication fill history:  Star Rating Drugs:  Atorvastatin 40 mg- Last filled 01-06-2022 90 DS. Previous 09-21-2021 90 DS Farxiga 10 mg- Last filled 01-04-2022 30 DS. Previous 12-01-2021 30 DS Lotrel 10-20 mg- Last filled 11-03-2021 90 DS. Previous 07-18-2021 90 DS   Assessment/Plan   Hypertension (BP goal <140/90) BP Readings from Last 3 Encounters:  01/01/22 138/80  11/05/21 (!) 150/90  07/08/21 120/60   -Controlled -Current treatment: Amlodipine/Benazapril 10/'20mg'$  QD Appropriate, Effective, Safe, Accessible -Medications previously tried: N/A  -Current home readings: Doesn't test -Current dietary habits: "Tries to eat Healthy" -Current exercise habits: Does chores around the house -Denies hypotensive/hypertensive symptoms -Educated on BP goals and benefits of medications for prevention of heart attack, stroke and kidney damage; Extensive time spent on counseling patient on blood pressure goal and impact of each antihypertensive medication on their blood pressure and risk reduction for CV disease.  Used analogies to explain the need for multiple antihypertensive medications to achieve BP goals and  that it is often a silent  disease with no symptoms.  -Counseled to monitor BP at home PRN, document, and provide log at future appointments November 2023: Sent msg to PCP asking for AWV so we can get BP reading and fix his BP score for 2023. If still elevated, recommend treatment December 2023: AWV this week   Hyperlipidemia: (LDL goal < 100)      Lab Results  Component Value Date    CHOL 157 11/05/2021    CHOL 146 07/08/2021    CHOL 198 04/03/2021         Lab Results  Component Value Date    HDL 52 11/05/2021    HDL 62 07/08/2021    HDL 60 04/03/2021         Lab Results  Component Value Date    LDLCALC 89 11/05/2021    LDLCALC 67 07/08/2021    LDLCALC 117 (H) 04/03/2021         Lab Results  Component Value Date    TRIG 86 11/05/2021    TRIG 88 07/08/2021    TRIG 117 04/03/2021         Lab Results  Component Value Date    CHOLHDL 3.0 11/05/2021    CHOLHDL 2.4 07/08/2021    CHOLHDL 3.3 04/03/2021  No results found for: "LDLDIRECT" -Controlled -Current treatment: Atorvastatin '40mg'$  Appropriate, Effective, Safe, Accessible -Medications previously tried: N/A  -Current dietary habits: "Tries to eat Healthy" -Current exercise habits: Does chores around the house -Educated on Cholesterol goals;  Over 15 minutes spent counseling patient on role of hyperlipidemia on heart disease and the impact of each lipid subclass with emphasis on LDL and heart disease risk.  Explained role of statins in prevention of CV disease complications and actual risk for adverse effects with statin treatment.  Counseled patient on genetic component placing them at risk for hyperlipidemia. -Recommended to continue current medication   Diabetes (A1c goal <7%)      Lab Results  Component Value Date    HGBA1C 7.7 (H) 11/05/2021    HGBA1C 6.8 (H) 07/08/2021    HGBA1C 7.3 (H) 04/03/2021         Lab Results  Component Value Date    MICROALBUR 80 05/17/2019    LDLCALC 89 11/05/2021    CREATININE 0.83 11/05/2021          Lab Results  Component Value Date    NA 141 11/05/2021    K 4.5 11/05/2021    CREATININE 0.83 11/05/2021    EGFR 94 11/05/2021    GFRNONAA >60 05/20/2021    GLUCOSE 101 (H) 11/05/2021         Lab Results  Component Value Date    WBC 8.4 11/05/2021    HGB 15.7 11/05/2021    HCT 45.9 11/05/2021    MCV 91 11/05/2021    PLT 304 11/05/2021  -Controlled -Current medications: Farxiga '10mg'$  Appropriate, Effective, Safe, Query accessible Tresiba 30 units Appropriate, Effective, Safe, Query accessible Metformin '1000mg'$  Appropriate, Effective, Safe, Accessible -Medications previously tried: N/A  -Current home glucose readings fasting glucose: Didn't have logs on him post prandial glucose: Didn't have logs on him -Denies hypoglycemic/hyperglycemic symptoms -Current meal patterns: Patient didn't answer initial question and since his labs are good, I didn't repeat myself -Current exercise: Chores -Educated on A1c and blood sugar goals; Extensive time was spent counseling patient on the pathophysiology and risk for complications with uncontrolled Diabetes.  Used teach back method for counseling on ADA diabetic  diet.  Time spent on explaining role of carbohydrates versus sugars impacting blood glucose levels and meal planning. Current exercise consists of walking 10 minutes three times a week, plan is to increase to 20 minutes 5 days a week and then after 2-3 weeks increase to 30 minute 5 days a week.  -Counseled to check feet daily and get yearly eye exams July 2023: PAP for Farxiga/Tresiba. Makes $45,000/year between him and wife August 2023: Patient found out he makes $65,000/year between him and his wife. He states he never got PAP paperwork in the mail but didn't think about calling. Will re-send out October 2023: Will re-send PAP again. States he hasn't gotten in the mail November 2023: Patient has paperwork, he just "forgot" to bring it in. Counseled extensively how A1c isn't your sugar that  day, but the previous 3 months. If he's having Sx in March, his sugars need to be great from [place]. He affirmed and will bring in by end of the week. Patient states he ran out of test strips a while ago and would like them sent to Pharmacy.  December 2023: Patient still hasn't brought in PAP paperwork. Will bring it in this week Jan 2024: Patient hasn't brought in PAP since July 2023. Will refrain from calling monthly   Depression/Anxiety -Patient has lost 2 of his sons, said, "If I didn't take this medicine my mind would just race and I wouldn't be able to focus or sleep or anything" -Controlled -Current treatment: Alprazolam BID Appropriate, Effective, Safe, Accessible -Medications previously tried/failed: N/A -PHQ9:     01/01/2022    1:01 PM 04/03/2021    7:43 AM 06/20/2020    9:07 AM  Depression screen PHQ 2/9  Decreased Interest 0 0 0  Down, Depressed, Hopeless 0 0 0  PHQ - 2 Score 0 0 0  -GAD7:      No data to display        -Educated on Benefits of medication for symptom control October 2022: Patient could try preventative therapy (SSRI/SNRI/Etc) so he doesn't have to take BNZ as often, patient wasn't open to changing any meds today. July 2023: Same as before   Pain: Controlled Pain Scale: Without Meds             -October 2022: 8/10 With Meds: -October 2022: 3-4/10 (Is able to do chores, just is slower) -Current medications: Diclofenac Appropriate, Effective, Safe, Accessible Hydrocodone/APAP 10/325 Appropriate, Effective, Safe, Accessible -Medications previously tried: N/A July 2023: Patient's priority was cost of meds. Unable to do pain scale but he did say that post knee Sx in May, he feels significantly better November 2023: Patient's first knee feels great. Has appt scheduled for March 2024 for the other knee Jan 2024: Knee Sx scheduled for March 2024  [place], [place].D. - [phone]   CPP F/U October 2024

## 2022-02-19 ENCOUNTER — Other Ambulatory Visit: Payer: Self-pay

## 2022-02-19 DIAGNOSIS — E1142 Type 2 diabetes mellitus with diabetic polyneuropathy: Secondary | ICD-10-CM

## 2022-02-19 MED ORDER — BD PEN NEEDLE MICRO U/F 32G X 6 MM MISC: 4 refills | Status: DC

## 2022-02-20 ENCOUNTER — Other Ambulatory Visit: Payer: Self-pay

## 2022-02-20 DIAGNOSIS — E1142 Type 2 diabetes mellitus with diabetic polyneuropathy: Secondary | ICD-10-CM

## 2022-02-20 MED ORDER — BD PEN NEEDLE MICRO U/F 32G X 6 MM MISC: 4 refills | Status: DC

## 2022-02-25 DIAGNOSIS — E1121 Type 2 diabetes mellitus with diabetic nephropathy: Secondary | ICD-10-CM | POA: Diagnosis not present

## 2022-02-25 DIAGNOSIS — E1142 Type 2 diabetes mellitus with diabetic polyneuropathy: Secondary | ICD-10-CM | POA: Diagnosis not present

## 2022-03-01 ENCOUNTER — Other Ambulatory Visit: Payer: Self-pay | Admitting: Family Medicine

## 2022-03-01 DIAGNOSIS — E1142 Type 2 diabetes mellitus with diabetic polyneuropathy: Secondary | ICD-10-CM

## 2022-03-05 ENCOUNTER — Other Ambulatory Visit: Payer: Self-pay

## 2022-03-05 DIAGNOSIS — M1712 Unilateral primary osteoarthritis, left knee: Secondary | ICD-10-CM

## 2022-03-05 DIAGNOSIS — F419 Anxiety disorder, unspecified: Secondary | ICD-10-CM

## 2022-03-06 MED ORDER — HYDROCODONE-ACETAMINOPHEN 10-325 MG PO TABS: 1.0000 | ORAL_TABLET | Freq: Two times a day (BID) | ORAL | 0 refills | Status: DC | PRN

## 2022-03-06 MED ORDER — ALPRAZOLAM 0.5 MG PO TABS: 0.5000 mg | ORAL_TABLET | Freq: Two times a day (BID) | ORAL | 0 refills | Status: DC | PRN

## 2022-03-07 ENCOUNTER — Other Ambulatory Visit: Payer: Self-pay | Admitting: Physician Assistant

## 2022-03-07 DIAGNOSIS — M1712 Unilateral primary osteoarthritis, left knee: Secondary | ICD-10-CM

## 2022-03-07 MED ORDER — HYDROCODONE-ACETAMINOPHEN 10-325 MG PO TABS: 1.0000 | ORAL_TABLET | Freq: Two times a day (BID) | ORAL | 0 refills | Status: DC | PRN

## 2022-03-09 NOTE — Progress Notes (Signed)
Subjective:  Patient ID: Clayton James, male    DOB: 03/21/1950  Age: 72 y.o. MRN: [id number]  Chief Complaint  Patient presents with   Diabetes   Osteoarthritis   Hyperlipidemia     History of Present illness: Patient is here for a follow up for his DM, hyperlipidemia and osteoarthirits. Patient lives home with his wife, he keeps doing yard work for exercise, he tries to keep moving.Patient watch his diet, he said he eats a lot of deer meat,  loves vegetables. He tries to stay away from sweet.  patient is regularly follow up with orthopaedics [facility], has total knee arthoplasty was done May 2023, he is waiting for the left knee arthoplasty once his A1C well controlled or less than 7. His last A1C was 7.3 so he is wondering about the upcoming results for A1C. He said everything is going well since then and eagerly waiting for the second surgery.  He is taking norco twice a day for chronic arthritis and rotator cuff surgey. Patient said he can't sleep at night at all if he cannot take Xanax 0.5 mg , sometimes he needs to take it on the day time too     Diabetes Mellitus Type II, follow-up  Lab Results  Component Value Date   HGBA1C 7.7 (H) 11/05/2021   HGBA1C 6.8 (H) 07/08/2021   HGBA1C 7.3 (H) 04/03/2021   Last seen for diabetes 4 months ago.  Management since then includes Tresiba 30 units daily, metformin 1000 mg twice daily , Farxiga 10 mg Once daily He reports good compliance with treatment. He is not having side effects.   Home blood sugar records: fasting range: 95-100 one time it was 69 PP runs 145-195 , average 120-152  Episodes of hypoglycemia? Yes , patient said he does not feel any different, he does not have signs sweating, tremor and dizziness.   Current insulin regiment: Tresiba 30 units Most Recent Eye Exam: he is overdue, said he will make an appointment    --------------------------------------------------------------------------------------------------- Hypertension, follow-up  BP Readings from Last 3 Encounters:  03/10/22 136/70  01/01/22 138/80  11/05/21 (!) 150/90   Wt Readings from Last 3 Encounters:  03/10/22 192 lb (87.1 kg)  01/01/22 194 lb 12.8 oz (88.4 kg)  11/05/21 192 lb (87.1 kg)     He was last seen for hypertension 4 months ago.  BP at that visit is 136 70. Management since that visit includes amlodipine-benazepril 10-20 once daily He reports excellent compliance with treatment. He is not having side effects.  He is exercising. He is adherent to low salt diet.   Outside blood pressures are .  He does not smoke. Patient said he chews tobacco everyday, 1 pouch is good enough for a month.   Use of agents associated with hypertension: none.   --------------------------------------------------------------------------------------------------- Lipid/Cholesterol, follow-up  Last Lipid Panel: Lab Results  Component Value Date   CHOL 157 11/05/2021   LDLCALC 89 11/05/2021   HDL 52 11/05/2021   TRIG 86 11/05/2021    He was last seen for this 4 months ago.  Management since that visit includes atorvastatin 40 mg OD  He reports excellent compliance with treatment. He is not having side effects.   He is following a Low fat diet. Current exercise: walking  Last metabolic panel Lab Results  Component Value Date   GLUCOSE 101 (H) 11/05/2021   NA 141 11/05/2021   K 4.5 11/05/2021   BUN 17 11/05/2021   CREATININE 0.83 11/05/2021  GFRNONAA >60 05/20/2021   GFRAA 109 02/15/2020   CALCIUM 9.8 11/05/2021   AST 12 11/05/2021   ALT 15 11/05/2021   The 10-year ASCVD risk score (Arnett DK, et al., 2019) is: 37.6%   ---------------------------------------------------------------------------------------------------  Current Outpatient Medications on File Prior to Visit  Medication Sig Dispense Refill    ALPRAZolam (XANAX) 0.5 MG tablet Take 1 tablet (0.5 mg total) by mouth 2 (two) times daily as needed. 60 tablet 0   amLODipine-benazepril (LOTREL) 10-20 MG capsule Take 1 capsule by mouth once daily 90 capsule 0   atorvastatin (LIPITOR) 40 MG tablet Take 1 tablet by mouth once daily 90 tablet 2   Blood Glucose Monitoring Suppl (ONE TOUCH ULTRA 2) w/Device KIT USE TO CHECK GLUCOSE THREE TIMES DAILY     dapagliflozin propanediol (FARXIGA) 10 MG TABS tablet Take 1 tablet (10 mg total) by mouth daily. 90 tablet 2   diclofenac Sodium (VOLTAREN) 1 % GEL Apply 2 g topically daily as needed (pain).     glucose blood test strip 1 each by Other route daily. Use as instructed 100 each 2   HYDROcodone-acetaminophen (NORCO) 10-325 MG tablet Take 1 tablet by mouth 2 (two) times daily as needed. 60 tablet 0   Insulin Pen Needle (BD PEN NEEDLE MICRO U/F) 32G X 6 MM MISC USE AS DIRECTED ONCE DAILY 100 each 4   metFORMIN (GLUCOPHAGE) 1000 MG tablet Take 1 tablet by mouth twice daily 180 tablet 0   tamsulosin (FLOMAX) 0.4 MG CAPS capsule Take 1 capsule (0.4 mg total) by mouth daily. 90 capsule 3   TRESIBA FLEXTOUCH 100 UNIT/ML FlexTouch Pen INJECT 30 UNITS SUBCUTANEOUSLY ONCE DAILY 15 mL 3   aspirin EC 81 MG tablet Take 1 tablet (81 mg total) by mouth 2 (two) times daily. To be taken after surgery to prevent blood clots (Patient not taking: Reported on 03/10/2022) 84 tablet 0   No current facility-administered medications on file prior to visit.   Past Medical History:  Diagnosis Date   Arthritis    Benign essential hypertension 04/27/2019   Chronic kidney disease, stage 3a (HCC) 04/27/2019   Mixed hyperlipidemia 04/27/2019   Type 2 diabetes mellitus with diabetic polyneuropathy (HCC) 04/27/2019   Type 2 diabetes mellitus with stage 3 chronic kidney disease (HCC) 04/27/2019   Past Surgical History:  Procedure Laterality Date   ELBOW SURGERY Left 1994   KNEE ARTHROSCOPY Right 1998   KNEE CARTILAGE SURGERY  Left 1994   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   Procedure: RIGHT TOTAL KNEE ARTHROPLASTY;  Surgeon: Tarry Kos, MD;  Location: [facility] OR;  Service: Orthopedics;  Laterality: Right;    Family History  Problem Relation Age of Onset   Diabetes Mother    Kidney disease Mother    Heart disease Father    Social History   Socioeconomic History   Marital status: Married    Spouse name: Technical sales engineer   Number of children: 3   Years of education: Not on file   Highest education level: Not on file  Occupational History   Not on file  Tobacco Use   Smoking status: Former    Packs/day: 0.50    Years: 8.00    Total pack years: 4.00    Types: Cigarettes    Quit date: 2000    Years since quitting: 24.1   Smokeless tobacco: Current    Types: Chew   Tobacco comments:    1 can per week  Vaping Use   Vaping  Use: Never used  Substance and Sexual Activity   Alcohol use: Not Currently   Drug use: Never   Sexual activity: Yes    Partners: Female  Other Topics Concern   Not on file  Social History Narrative   Oldest son and youngest son passed away   Social Determinants of Health   Financial Resource Strain: Medium Risk (02/11/2022)   Overall Financial Resource Strain (CARDIA)    Difficulty of Paying Living Expenses: Somewhat hard  Food Insecurity: No Food Insecurity (01/01/2022)   Hunger Vital Sign    Worried About Running Out of Food in the Last Year: Never true    Ran Out of Food in the Last Year: Never true  Transportation Needs: No Transportation Needs (02/11/2022)   PRAPARE - Administrator, Civil Service (Medical): No    Lack of Transportation (Non-Medical): No  Physical Activity: Insufficiently Active (01/01/2022)   Exercise Vital Sign    Days of Exercise per Week: 3 days    Minutes of Exercise per Session: 30 min  Stress: No Stress Concern Present (01/01/2022)   [employer] of Occupational Health - Occupational Stress Questionnaire    Feeling of Stress :  Not at all  Social Connections: Not on file    Review of Systems  Constitutional:  Negative for chills, diaphoresis, fatigue and fever.  HENT:  Negative for congestion, ear pain and sore throat.   Respiratory:  Negative for cough and shortness of breath.   Cardiovascular:  Negative for chest pain and palpitations.  Gastrointestinal:  Negative for abdominal pain, constipation, diarrhea, nausea and vomiting.  Endocrine: Negative for polydipsia, polyphagia and polyuria.  Genitourinary:  Negative for dysuria and frequency.  Musculoskeletal:  Positive for arthralgias (left knee pain). Negative for back pain and myalgias.  Neurological:  Negative for dizziness and headaches.  Psychiatric/Behavioral:  Negative for dysphoric mood. The patient is not nervous/anxious.      Objective:  BP 136/70   Pulse 78   Temp (!) 97.1 F (36.2 C)   Resp 16   Ht 5\' 11"  (1.803 m)   Wt 192 lb (87.1 kg)   BMI 26.78 kg/m      03/10/2022    9:56 AM 01/01/2022   12:55 PM 11/05/2021   10:03 AM  BP/Weight  Systolic BP 136 138 150  Diastolic BP 70 80 90  Wt. (Lbs) 192 194.8 192  BMI 26.78 kg/m2 27.36 kg/m2 26.78 kg/m2    Physical Exam Vitals reviewed.  Constitutional:      Appearance: Normal appearance.  HENT:     Right Ear: Tympanic membrane normal.     Left Ear: Tympanic membrane normal.     Nose: Nose normal.     Mouth/Throat:     Pharynx: No oropharyngeal exudate or posterior oropharyngeal erythema.  Eyes:     Conjunctiva/sclera: Conjunctivae normal.  Neck:     Vascular: No carotid bruit.  Cardiovascular:     Rate and Rhythm: Normal rate and regular rhythm.     Pulses: Normal pulses.     Heart sounds: Normal heart sounds.  Pulmonary:     Effort: Pulmonary effort is normal.     Breath sounds: Normal breath sounds.  Abdominal:     General: Bowel sounds are normal.     Palpations: There is no mass.     Tenderness: There is no abdominal tenderness.  Musculoskeletal:        General:  Tenderness (left knee tenderness) present.  Cervical back: Normal range of motion.  Skin:    Findings: No lesion.  Neurological:     Mental Status: He is alert and oriented to person, place, and time.  Psychiatric:        Mood and Affect: Mood normal.        Behavior: Behavior normal.     Diabetic Foot Exam - Simple   Simple Foot Form Diabetic Foot exam was performed with the following findings: Yes 03/10/2022  4:45 PM  Visual Inspection No deformities, no ulcerations, no other skin breakdown bilaterally: Yes Sensation Testing Intact to touch and monofilament testing bilaterally: Yes Pulse Check Posterior Tibialis and Dorsalis pulse intact bilaterally: Yes Comments        03/10/2022    4:44 PM 03/10/2022   10:01 AM 01/01/2022    1:01 PM 04/03/2021    7:43 AM 06/20/2020    9:07 AM  Depression screen PHQ 2/9  Decreased Interest 0 0 0 0 0  Down, Depressed, Hopeless 0 0 0 0 0  PHQ - 2 Score 0 0 0 0 0       05/26/2021    3:17 PM 05/26/2021    7:30 PM 01/01/2022    1:03 PM 03/10/2022   10:00 AM 03/10/2022    4:44 PM  Fall Risk  Falls in the past year?   0 0 0  Was there an injury with Fall?   0 0 0  Fall Risk Category Calculator   0 0 0  Fall Risk Category (Retired)   Low    (RETIRED) Patient Fall Risk Level Moderate fall risk Moderate fall risk Low fall risk    Patient at Risk for Falls Due to   Impaired balance/gait Impaired mobility   Fall risk Follow up   Falls evaluation completed;Education provided Falls evaluation completed     Lab Results  Component Value Date   WBC 8.4 11/05/2021   HGB 15.7 11/05/2021   HCT 45.9 11/05/2021   PLT 304 11/05/2021   GLUCOSE 101 (H) 11/05/2021   CHOL 157 11/05/2021   TRIG 86 11/05/2021   HDL 52 11/05/2021   LDLCALC 89 11/05/2021   ALT 15 11/05/2021   AST 12 11/05/2021   NA 141 11/05/2021   K 4.5 11/05/2021   CL 103 11/05/2021   CREATININE 0.83 11/05/2021   BUN 17 11/05/2021   CO2 20 11/05/2021   HGBA1C 7.7 (H) 11/05/2021    MICROALBUR 80 05/17/2019      Assessment & Plan:   Type 2 diabetes mellitus with diabetic polyneuropathy, with long-term current use of insulin (HCC) -     Lipid panel -     Hemoglobin A1c  Benign essential hypertension Assessment & Plan: Well controlled Continue amlodipine-benazepril 10-20 mg OD   Nutrition: Stressed importance of moderation in sodium intake, saturated fat and cholesterol, caloric balance, sufficient intake of complex carbohydrates, fiber, calcium and iron.   Exercise: Stressed the importance of regular exercise.     Orders: -     CBC with Differential/Platelet -     Comprehensive metabolic panel  Mixed hyperlipidemia Assessment & Plan: Will recheck lipid profile Continue lipitor 40 mg daily  Nutrition: Stressed importance of moderation in sodium intake, saturated fat and cholesterol, caloric balance, sufficient intake of complex carbohydrates, fiber, calcium and iron.   Exercise: Stressed the importance of regular exercise.     Orders: -     Lipid panel -     Hemoglobin A1c  Primary osteoarthritis of left  knee Assessment & Plan: Waiting for the TKA once A1C below 7   Primary osteoarthritis of right knee Assessment & Plan: TKA done and he is feeling better   Benign prostatic hyperplasia with urinary frequency -     PSA   Follow-up: Return in about 3 months (around 06/08/2022) for CHRONIC, FASTING.  An After Visit Summary was printed and given to the patient.  I, Lurline Del have reviewed all documentation for this visit. The documentation on 03/10/22   for the exam, diagnosis, procedures, and orders are all accurate and complete.    Lurline Del, DNP, FNP [facility] [phone]

## 2022-03-10 ENCOUNTER — Ambulatory Visit (INDEPENDENT_AMBULATORY_CARE_PROVIDER_SITE_OTHER): Payer: Medicare HMO | Admitting: Nurse Practitioner

## 2022-03-10 ENCOUNTER — Encounter: Payer: Self-pay | Admitting: Nurse Practitioner

## 2022-03-10 ENCOUNTER — Ambulatory Visit: Payer: Medicare HMO | Admitting: Nurse Practitioner

## 2022-03-10 VITALS — BP 136/70 | HR 78 | Temp 97.1°F | Resp 16 | Ht 71.0 in | Wt 192.0 lb

## 2022-03-10 DIAGNOSIS — I10 Essential (primary) hypertension: Secondary | ICD-10-CM

## 2022-03-10 DIAGNOSIS — E1142 Type 2 diabetes mellitus with diabetic polyneuropathy: Secondary | ICD-10-CM | POA: Diagnosis not present

## 2022-03-10 DIAGNOSIS — E782 Mixed hyperlipidemia: Secondary | ICD-10-CM | POA: Diagnosis not present

## 2022-03-10 DIAGNOSIS — R350 Frequency of micturition: Secondary | ICD-10-CM | POA: Diagnosis not present

## 2022-03-10 DIAGNOSIS — M170 Bilateral primary osteoarthritis of knee: Secondary | ICD-10-CM | POA: Diagnosis not present

## 2022-03-10 DIAGNOSIS — M1712 Unilateral primary osteoarthritis, left knee: Secondary | ICD-10-CM

## 2022-03-10 DIAGNOSIS — Z794 Long term (current) use of insulin: Secondary | ICD-10-CM

## 2022-03-10 DIAGNOSIS — N401 Enlarged prostate with lower urinary tract symptoms: Secondary | ICD-10-CM

## 2022-03-10 DIAGNOSIS — M1711 Unilateral primary osteoarthritis, right knee: Secondary | ICD-10-CM

## 2022-03-10 NOTE — Assessment & Plan Note (Signed)
Will recheck lipid profile Continue lipitor 40 mg daily  Nutrition: Stressed importance of moderation in sodium intake, saturated fat and cholesterol, caloric balance, sufficient intake of complex carbohydrates, fiber, calcium and iron.   Exercise: Stressed the importance of regular exercise.

## 2022-03-10 NOTE — Assessment & Plan Note (Signed)
Well controlled Continue amlodipine-benazepril 10-20 mg OD   Nutrition: Stressed importance of moderation in sodium intake, saturated fat and cholesterol, caloric balance, sufficient intake of complex carbohydrates, fiber, calcium and iron.   Exercise: Stressed the importance of regular exercise.

## 2022-03-10 NOTE — Assessment & Plan Note (Signed)
TKA done and he is feeling better

## 2022-03-10 NOTE — Assessment & Plan Note (Signed)
Waiting for the TKA once A1C below 7

## 2022-03-10 NOTE — Patient Instructions (Signed)
Follow up in 3 months We will call back with the lab results  Diabetes Mellitus and Nutrition, Adult When you have diabetes, or diabetes mellitus, it is very important to have healthy eating habits because your blood sugar (glucose) levels are greatly affected by what you eat and drink. Eating healthy foods in the right amounts, at about the same times every day, can help you: Manage your blood glucose. Lower your risk of heart disease. Improve your blood pressure. Reach or maintain a healthy weight. What can affect my meal plan? Every person with diabetes is different, and each person has different needs for a meal plan. Your health care provider may recommend that you work with a dietitian to make a meal plan that is best for you. Your meal plan may vary depending on factors such as: The calories you need. The medicines you take. Your weight. Your blood glucose, blood pressure, and cholesterol levels. Your activity level. Other health conditions you have, such as heart or kidney disease. How do carbohydrates affect me? Carbohydrates, also called carbs, affect your blood glucose level more than any other type of food. Eating carbs raises the amount of glucose in your blood. It is important to know how many carbs you can safely have in each meal. This is different for every person. Your dietitian can help you calculate how many carbs you should have at each meal and for each snack. How does alcohol affect me? Alcohol can cause a decrease in blood glucose (hypoglycemia), especially if you use insulin or take certain diabetes medicines by mouth. Hypoglycemia can be a life-threatening condition. Symptoms of hypoglycemia, such as sleepiness, dizziness, and confusion, are similar to symptoms of having too much alcohol. Do not drink alcohol if: Your health care provider tells you not to drink. You are pregnant, may be pregnant, or are planning to become pregnant. If you drink alcohol: Limit how  much you have to: 0-1 drink a day for women. 0-2 drinks a day for men. Know how much alcohol is in your drink. In the [place] one drink equals one 12 oz bottle of beer (355 mL), one 5 oz glass of wine (148 mL), or one 1 oz glass of hard liquor (44 mL). Keep yourself hydrated with water, diet soda, or unsweetened iced tea. Keep in mind that regular soda, juice, and other mixers may contain a lot of sugar and must be counted as carbs. What are tips for following this plan?  Reading food labels Start by checking the serving size on the Nutrition Facts label of packaged foods and drinks. The number of calories and the amount of carbs, fats, and other nutrients listed on the label are based on one serving of the item. Many items contain more than one serving per package. Check the total grams (g) of carbs in one serving. Check the number of grams of saturated fats and trans fats in one serving. Choose foods that have a low amount or none of these fats. Check the number of milligrams (mg) of salt (sodium) in one serving. Most people should limit total sodium intake to less than 2,300 mg per day. Always check the nutrition information of foods labeled as "low-fat" or "nonfat." These foods may be higher in added sugar or refined carbs and should be avoided. Talk to your dietitian to identify your daily goals for nutrients listed on the label. Shopping Avoid buying canned, pre-made, or processed foods. These foods tend to be high in fat, sodium, and added  sugar. Shop around the outside edge of the grocery store. This is where you will most often find fresh fruits and vegetables, bulk grains, fresh meats, and fresh dairy products. Cooking Use low-heat cooking methods, such as baking, instead of high-heat cooking methods, such as deep frying. Cook using healthy oils, such as olive, canola, or sunflower oil. Avoid cooking with butter, cream, or high-fat meats. Meal planning Eat meals and snacks regularly,  preferably at the same times every day. Avoid going long periods of time without eating. Eat foods that are high in fiber, such as fresh fruits, vegetables, beans, and whole grains. Eat 4-6 oz (112-168 g) of lean protein each day, such as lean meat, chicken, fish, eggs, or tofu. One ounce (oz) (28 g) of lean protein is equal to: 1 oz (28 g) of meat, chicken, or fish. 1 egg.  cup (62 g) of tofu. Eat some foods each day that contain healthy fats, such as avocado, nuts, seeds, and fish. What foods should I eat? Fruits Berries. Apples. Oranges. Peaches. Apricots. Plums. Grapes. Mangoes. Papayas. Pomegranates. Kiwi. Cherries. Vegetables Leafy greens, including lettuce, spinach, kale, chard, collard greens, mustard greens, and cabbage. Beets. Cauliflower. Broccoli. Carrots. Green beans. Tomatoes. Peppers. Onions. Cucumbers. Brussels sprouts. Grains Whole grains, such as whole-wheat or whole-grain bread, crackers, tortillas, cereal, and pasta. Unsweetened oatmeal. Quinoa. Brown or wild rice. Meats and other proteins Seafood. Poultry without skin. Lean cuts of poultry and beef. Tofu. Nuts. Seeds. Dairy Low-fat or fat-free dairy products such as milk, yogurt, and cheese. The items listed above may not be a complete list of foods and beverages you can eat and drink. Contact a dietitian for more information. What foods should I avoid? Fruits Fruits canned with syrup. Vegetables Canned vegetables. Frozen vegetables with butter or cream sauce. Grains Refined white flour and flour products such as bread, pasta, snack foods, and cereals. Avoid all processed foods. Meats and other proteins Fatty cuts of meat. Poultry with skin. Breaded or fried meats. Processed meat. Avoid saturated fats. Dairy Full-fat yogurt, cheese, or milk. Beverages Sweetened drinks, such as soda or iced tea. The items listed above may not be a complete list of foods and beverages you should avoid. Contact a dietitian for more  information. Questions to ask a health care provider Do I need to meet with a certified diabetes care and education specialist? Do I need to meet with a dietitian? What number can I call if I have questions? When are the best times to check my blood glucose? Where to find more information: American Diabetes Association: diabetes.org Academy of Nutrition and Dietetics: eatright.[employer] of Diabetes and Digestive and Kidney Diseases: [employer] Association of Diabetes Care & Education Specialists: diabeteseducator.org Summary It is important to have healthy eating habits because your blood sugar (glucose) levels are greatly affected by what you eat and drink. It is important to use alcohol carefully. A healthy meal plan will help you manage your blood glucose and lower your risk of heart disease. Your health care provider may recommend that you work with a dietitian to make a meal plan that is best for you. This information is not intended to replace advice given to you by your health care provider. Make sure you discuss any questions you have with your health care provider. Document Revised: 08/09/2019 Document Reviewed: 08/09/2019 Elsevier Patient Education  [place].

## 2022-03-11 LAB — COMPREHENSIVE METABOLIC PANEL
ALT: 11 IU/L (ref 0–44)
AST: 13 IU/L (ref 0–40)
Albumin/Globulin Ratio: 2 (ref 1.2–2.2)
Albumin: 4.5 g/dL (ref 3.8–4.8)
Alkaline Phosphatase: 84 IU/L (ref 44–121)
BUN/Creatinine Ratio: 18 (ref 10–24)
BUN: 14 mg/dL (ref 8–27)
Bilirubin Total: 0.6 mg/dL (ref 0.0–1.2)
CO2: 22 mmol/L (ref 20–29)
Calcium: 9.8 mg/dL (ref 8.6–10.2)
Chloride: 104 mmol/L (ref 96–106)
Creatinine, Ser: 0.77 mg/dL (ref 0.76–1.27)
Globulin, Total: 2.2 g/dL (ref 1.5–4.5)
Glucose: 81 mg/dL (ref 70–99)
Potassium: 4.6 mmol/L (ref 3.5–5.2)
Sodium: 143 mmol/L (ref 134–144)
Total Protein: 6.7 g/dL (ref 6.0–8.5)
eGFR: 96 mL/min/{1.73_m2} (ref >59)

## 2022-03-11 LAB — CBC WITH DIFFERENTIAL/PLATELET
Basophils Absolute: 0.1 10*3/uL (ref 0.0–0.2)
Basos: 1 %
EOS (ABSOLUTE): 0.4 10*3/uL (ref 0.0–0.4)
Eos: 6 %
Hematocrit: 46.7 % (ref 37.5–51.0)
Hemoglobin: 16.2 g/dL (ref 13.0–17.7)
Immature Grans (Abs): 0 10*3/uL (ref 0.0–0.1)
Immature Granulocytes: 0 %
Lymphocytes Absolute: 1.9 10*3/uL (ref 0.7–3.1)
Lymphs: 31 %
MCH: 31.5 pg (ref 26.6–33.0)
MCHC: 34.7 g/dL (ref 31.5–35.7)
MCV: 91 fL (ref 79–97)
Monocytes Absolute: 0.5 10*3/uL (ref 0.1–0.9)
Monocytes: 8 %
Neutrophils Absolute: 3.2 10*3/uL (ref 1.4–7.0)
Neutrophils: 54 %
Platelets: 301 10*3/uL (ref 150–450)
RBC: 5.14 x10E6/uL (ref 4.14–5.80)
RDW: 13.1 % (ref 11.6–15.4)
WBC: 6.1 10*3/uL (ref 3.4–10.8)

## 2022-03-11 LAB — LIPID PANEL
Chol/HDL Ratio: 2.4 ratio (ref 0.0–5.0)
Cholesterol, Total: 158 mg/dL (ref 100–199)
HDL: 65 mg/dL (ref >39)
LDL Chol Calc (NIH): 75 mg/dL (ref 0–99)
Triglycerides: 99 mg/dL (ref 0–149)
VLDL Cholesterol Cal: 18 mg/dL (ref 5–40)

## 2022-03-11 LAB — CARDIOVASCULAR RISK ASSESSMENT

## 2022-03-11 LAB — HEMOGLOBIN A1C
Est. average glucose Bld gHb Est-mCnc: 148 mg/dL
Hgb A1c MFr Bld: 6.8 % — ABNORMAL HIGH (ref 4.8–5.6)

## 2022-03-11 LAB — PSA: Prostate Specific Ag, Serum: 2.2 ng/mL (ref 0.0–4.0)

## 2022-03-13 ENCOUNTER — Telehealth: Payer: Self-pay

## 2022-03-13 NOTE — Progress Notes (Signed)
Care Management & Coordination Services Pharmacy Team  Reason for Encounter: Diabetes  Contacted patient to discuss diabetes disease state. Spoke with patient on 03/13/2022   Current antihyperglycemic regimen:  Metformin 1000 mg twice daily Farxiga 10 mg daily   Patient verbally confirms he is taking the above medications as directed. Yes  What diet changes have been made to improve diabetes control? Patient stated he has improved eating habits and drinks plenty of water daily  What recent interventions/DTPs have been made to improve glycemic control:  Patient's A1C has lowered to 6.8 and now patient can get his knee replacement. Patient feels since he started using the freestyle libre it forces him to control his blood sugars.  Have there been any recent hospitalizations or ED visits since last visit with PharmD? No  Patient denies hypoglycemic symptoms  Patient denies hyperglycemic symptoms  How often are you checking your blood sugar? Uses freestyle libre  What are your blood sugars ranging?  Fasting: 02-22 102, 02-21 89, 02-20 91, 02-19 124, 02-18 122 Before meals: None After meals: None Bedtime: None  During the week, how often does your blood glucose drop below 70? Never  Are you checking your feet daily/regularly? Yes  Adherence Review: Is the patient currently on a STATIN medication? Yes Is the patient currently on ACE/ARB medication? Yes Does the patient have >5 day gap between last estimated fill dates? No   Chart Updates: Recent office visits:  03-10-2022 Neil Crouch, FNP. A1C= 6.8. Finished robaxin.  Recent consult visits:  None  Hospital visits:  None in previous 6 months  Medications: Outpatient Encounter Medications as of 03/13/2022  Medication Sig   ALPRAZolam (XANAX) 0.5 MG tablet Take 1 tablet (0.5 mg total) by mouth 2 (two) times daily as needed.   amLODipine-benazepril (LOTREL) 10-20 MG capsule Take 1 capsule by mouth once daily   aspirin EC  81 MG tablet Take 1 tablet (81 mg total) by mouth 2 (two) times daily. To be taken after surgery to prevent blood clots (Patient not taking: Reported on 03/10/2022)   atorvastatin (LIPITOR) 40 MG tablet Take 1 tablet by mouth once daily   Blood Glucose Monitoring Suppl (ONE TOUCH ULTRA 2) w/Device KIT USE TO CHECK GLUCOSE THREE TIMES DAILY   dapagliflozin propanediol (FARXIGA) 10 MG TABS tablet Take 1 tablet (10 mg total) by mouth daily.   diclofenac Sodium (VOLTAREN) 1 % GEL Apply 2 g topically daily as needed (pain).   glucose blood test strip 1 each by Other route daily. Use as instructed   HYDROcodone-acetaminophen (NORCO) 10-325 MG tablet Take 1 tablet by mouth 2 (two) times daily as needed.   Insulin Pen Needle (BD PEN NEEDLE MICRO U/F) 32G X 6 MM MISC USE AS DIRECTED ONCE DAILY   metFORMIN (GLUCOPHAGE) 1000 MG tablet Take 1 tablet by mouth twice daily   tamsulosin (FLOMAX) 0.4 MG CAPS capsule Take 1 capsule (0.4 mg total) by mouth daily.   TRESIBA FLEXTOUCH 100 UNIT/ML FlexTouch Pen INJECT 30 UNITS SUBCUTANEOUSLY ONCE DAILY   No facility-administered encounter medications on file as of 03/13/2022.    Recent Relevant Labs: Lab Results  Component Value Date/Time   HGBA1C 6.8 (H) 03/10/2022 10:45 AM   HGBA1C 7.7 (H) 11/05/2021 10:49 AM   MICROALBUR 80 05/17/2019 09:03 AM    Kidney Function Lab Results  Component Value Date/Time   CREATININE 0.77 03/10/2022 10:45 AM   CREATININE 0.83 11/05/2021 10:49 AM   GFRNONAA >60 05/20/2021 11:21 AM   GFRAA 109  02/15/2020 10:01 AM    Star Rating Drugs:  Atorvastatin 40 mg- Last filled 01-06-2022 90 DS. Previous 09-21-2021 90 DS Farxiga 10 mg- Last filled 03-04-2022 30 DS. Previous 02-03-2022 30 DS Lotrel 10-20 mg- Last filled 02-09-2022 90 DS . Previous 11-03-2021 90 DS Metformin 1000 mg- Last filled 03-02-2022 90 DS. Previous 11-30-2021 90 DS.  Care Gaps: Annual wellness visit in last year? Yes Last eye exam / retinopathy screening:  Patient stated in 2022 but he will call to schedule Last diabetic foot exam: None Tdap overdue Shingrix overdue Eye exam overdue  [place] Pharmacist Assistant [phone]

## 2022-03-27 ENCOUNTER — Other Ambulatory Visit: Payer: Self-pay | Admitting: Physician Assistant

## 2022-03-27 ENCOUNTER — Telehealth: Payer: Self-pay

## 2022-03-27 DIAGNOSIS — E1121 Type 2 diabetes mellitus with diabetic nephropathy: Secondary | ICD-10-CM | POA: Diagnosis not present

## 2022-03-27 DIAGNOSIS — E1142 Type 2 diabetes mellitus with diabetic polyneuropathy: Secondary | ICD-10-CM | POA: Diagnosis not present

## 2022-03-27 MED ORDER — CEFADROXIL 500 MG PO CAPS: 500.0000 mg | ORAL_CAPSULE | Freq: Two times a day (BID) | ORAL | 0 refills | Status: DC

## 2022-03-27 MED ORDER — ONDANSETRON HCL 4 MG PO TABS: 4.0000 mg | ORAL_TABLET | Freq: Three times a day (TID) | ORAL | 0 refills | Status: DC | PRN

## 2022-03-27 MED ORDER — ASPIRIN 81 MG PO TBEC: 81.0000 mg | DELAYED_RELEASE_TABLET | Freq: Two times a day (BID) | ORAL | 0 refills | Status: DC

## 2022-03-27 MED ORDER — OXYCODONE-ACETAMINOPHEN 5-325 MG PO TABS: 1.0000 | ORAL_TABLET | Freq: Four times a day (QID) | ORAL | 0 refills | Status: DC | PRN

## 2022-03-27 MED ORDER — DOCUSATE SODIUM 100 MG PO CAPS: 100.0000 mg | ORAL_CAPSULE | Freq: Every day | ORAL | 2 refills | Status: DC | PRN

## 2022-03-27 MED ORDER — METHOCARBAMOL 750 MG PO TABS: 750.0000 mg | ORAL_TABLET | Freq: Two times a day (BID) | ORAL | 2 refills | Status: DC | PRN

## 2022-03-27 NOTE — Telephone Encounter (Signed)
Patient called and stated that his knee surgery is scheduled for 04/06/22. Patient is taking the Hydrocodone as prescribed to him, and he is applying the voltaren get up to 4 times daily every day. He states that it is getting harder and harder to move around and he states he has stuff to still do around the house and sounded more frustrated than anything. Please advise.

## 2022-03-30 NOTE — Telephone Encounter (Signed)
Patient stated that oxycodone that orthopedics sent to pharmacy can't be picked up till after surgery, so he is currently not taking Oxycodone, as long as he is not up moving around he is fine but the pain is a lot when he is up moving around and he is not able to just sit around due to he has to get things done before he has his knee surgery and helping his daughter out with the kids. Stated every day things is harder for him to do. Please advise

## 2022-03-31 NOTE — Pre-Procedure Instructions (Signed)
Surgical Instructions    Your procedure is scheduled on April 06, 2022.  Report to [facility] Main Entrance "A" at 5:30 A.M., then check in with the Admitting office.  Call this number if you have problems the morning of surgery:  [phone]  If you have any questions prior to your surgery date call [phone]: Open Monday-Friday 8am-4pm If you experience any cold or flu symptoms such as cough, fever, chills, shortness of breath, etc. between now and your scheduled surgery, please notify us at the above number.     Remember:  Do not eat after midnight the night before your surgery  You may drink clear liquids until 4:30 AM the morning of your surgery.   Clear liquids allowed are: Water, Non-Citrus Juices (without pulp), Carbonated Beverages, Clear Tea, Black Coffee Only (NO MILK, CREAM OR POWDERED CREAMER of any kind), and Gatorade.  Patient Instructions  The night before surgery:  No food after midnight. ONLY clear liquids after midnight  The day of surgery (if you have diabetes): Drink ONE (1) 12 oz G2 given to you in your pre admission testing appointment by 4:30 AM the morning of surgery. Drink in one sitting. Do not sip.  This drink was given to you during your hospital  pre-op appointment visit.  Nothing else to drink after completing the  12 oz bottle of G2.         If you have questions, please contact your surgeon's office.     Take these medicines the morning of surgery with A SIP OF WATER:  atorvastatin (LIPITOR)   tamsulosin [facility])    May take these medicines IF NEEDED:  ALPRAZolam (XANAX)   HYDROcodone-acetaminophen [facility])     Follow your surgeon's instructions on when to stop Aspirin.  If no instructions were given by your surgeon then you will need to call the office to get those instructions.     As of today, STOP taking any Aleve, Naproxen, Ibuprofen, Motrin, Advil, Goody's, BC's, all herbal medications, fish oil, and all vitamins. This includes  your medication: diclofenac Sodium (VOLTAREN) GEL            WHAT DO I DO ABOUT MY DIABETES MEDICATION?   Do not take metFORMIN (GLUCOPHAGE) the morning of surgery.  STOP taking your dapagliflozin propanediol (FARXIGA) three days prior to surgery. Your last dose of this medication will be March 14th.   If you take TRESIBA in the morning: take 100% of your dose the day before surgery. Only take 15 units (50%) the morning of surgery.  If you take TRESIBA in the evening: only take 15 units the evening before surgery. DO NOT take any the morning of surgery.    HOW TO MANAGE YOUR DIABETES BEFORE AND AFTER SURGERY  Why is it important to control my blood sugar before and after surgery? Improving blood sugar levels before and after surgery helps healing and can limit problems. A way of improving blood sugar control is eating a healthy diet by:  Eating less sugar and carbohydrates  Increasing activity/exercise  Talking with your doctor about reaching your blood sugar goals High blood sugars (greater than 180 mg/dL) can raise your risk of infections and slow your recovery, so you will need to focus on controlling your diabetes during the weeks before surgery. Make sure that the doctor who takes care of your diabetes knows about your planned surgery including the date and location.  How do I manage my blood sugar before surgery? Check your blood  sugar at least 4 times a day, starting 2 days before surgery, to make sure that the level is not too high or low.  Check your blood sugar the morning of your surgery when you wake up and every 2 hours until you get to the Short Stay unit.  If your blood sugar is less than 70 mg/dL, you will need to treat for low blood sugar: Do not take insulin. Treat a low blood sugar (less than 70 mg/dL) with  cup of clear juice (cranberry or apple), 4 glucose tablets, OR glucose gel. Recheck blood sugar in 15 minutes after treatment (to make sure it is greater  than 70 mg/dL). If your blood sugar is not greater than 70 mg/dL on recheck, call [phone] for further instructions. Report your blood sugar to the short stay nurse when you get to Short Stay.  If you are admitted to the hospital after surgery: Your blood sugar will be checked by the staff and you will probably be given insulin after surgery (instead of oral diabetes medicines) to make sure you have good blood sugar levels. The goal for blood sugar control after surgery is 80-180 mg/dL.             Do NOT Smoke (Tobacco/Vaping) for 24 hours prior to your procedure.  If you use a CPAP at night, you may bring your mask/headgear for your overnight stay.   Contacts, glasses, piercing's, hearing aid's, dentures or partials may not be worn into surgery, please bring cases for these belongings.    For patients admitted to the hospital, discharge time will be determined by your treatment team.   Patients discharged the day of surgery will not be allowed to drive home, and someone needs to stay with them for 24 hours.  SURGICAL WAITING ROOM VISITATION Patients having surgery or a procedure may have no more than 2 support people in the waiting area - these visitors may rotate.   Children under the age of 5 must have an adult with them who is not the patient. If the patient needs to stay at the hospital during part of their recovery, the visitor guidelines for inpatient rooms apply. Pre-op nurse will coordinate an appropriate time for 1 support person to accompany patient in pre-op.  This support person may not rotate.   Please refer to the [employer] website for the visitor guidelines for Inpatients (after your surgery is over and you are in a regular room).    Special instructions:   [place]- Preparing For Surgery  Before surgery, you can play an important role. Because skin is not sterile, your skin needs to be as free of germs as possible. You can reduce the number of germs on your  skin by washing with CHG (chlorahexidine gluconate) Soap before surgery.  CHG is an antiseptic cleaner which kills germs and bonds with the skin to continue killing germs even after washing.    Oral Hygiene is also important to reduce your risk of infection.  Remember - BRUSH YOUR TEETH THE MORNING OF SURGERY WITH YOUR REGULAR TOOTHPASTE  Please do not use if you have an allergy to CHG or antibacterial soaps. If your skin becomes reddened/irritated stop using the CHG.  Do not shave (including legs and underarms) for at least 48 hours prior to first CHG shower. It is OK to shave your face.  Please follow these instructions carefully.   Shower the NIGHT BEFORE SURGERY and the MORNING OF SURGERY  If you chose to  wash your hair, wash your hair first as usual with your normal shampoo.  After you shampoo, rinse your hair and body thoroughly to remove the shampoo.  Use CHG Soap as you would any other liquid soap. You can apply CHG directly to the skin and wash gently with a scrungie or a clean washcloth.   Apply the CHG Soap to your body ONLY FROM THE NECK DOWN.  Do not use on open wounds or open sores. Avoid contact with your eyes, ears, mouth and genitals (private parts). Wash Face and genitals (private parts)  with your normal soap.   Wash thoroughly, paying special attention to the area where your surgery will be performed.  Thoroughly rinse your body with warm water from the neck down.  DO NOT shower/wash with your normal soap after using and rinsing off the CHG Soap.  Pat yourself dry with a CLEAN TOWEL.  Wear CLEAN PAJAMAS to bed the night before surgery  Place CLEAN SHEETS on your bed the night before your surgery  DO NOT SLEEP WITH PETS.   Day of Surgery: Take a shower with CHG soap. Do not wear jewelry or makeup Do not wear lotions, powders, perfumes/colognes, or deodorant. Do not shave 48 hours prior to surgery.  Men may shave face and neck. Do not bring valuables to the  hospital.  [facility] is not responsible for any belongings or valuables. Do not wear nail polish, gel polish, artificial nails, or any other type of covering on natural nails (fingers and toes) If you have artificial nails or gel coating that need to be removed by a nail salon, please have this removed prior to surgery. Artificial nails or gel coating may interfere with anesthesia's ability to adequately monitor your vital signs.  Wear Clean/Comfortable clothing the morning of surgery Remember to brush your teeth WITH YOUR REGULAR TOOTHPASTE.   Please read over the following fact sheets that you were given.    If you received a COVID test during your pre-op visit  it is requested that you wear a mask when out in public, stay away from anyone that may not be feeling well and notify your surgeon if you develop symptoms. If you have been in contact with anyone that has tested positive in the last 10 days please notify you surgeon.

## 2022-03-31 NOTE — Telephone Encounter (Signed)
Patient informed. 

## 2022-04-01 ENCOUNTER — Encounter (HOSPITAL_COMMUNITY): Payer: Self-pay

## 2022-04-01 ENCOUNTER — Other Ambulatory Visit: Payer: Self-pay

## 2022-04-01 ENCOUNTER — Encounter (HOSPITAL_COMMUNITY)
Admission: RE | Admit: 2022-04-01 | Discharge: 2022-04-01 | Disposition: A | Discharge status: Home / Self Care | Payer: Medicare HMO | Source: Ambulatory Visit | Attending: Orthopaedic Surgery | Admitting: Orthopaedic Surgery

## 2022-04-01 VITALS — BP 158/86 | HR 65 | Temp 97.6°F | Resp 18 | Ht 70.0 in | Wt 188.4 lb

## 2022-04-01 DIAGNOSIS — M1712 Unilateral primary osteoarthritis, left knee: Secondary | ICD-10-CM | POA: Insufficient documentation

## 2022-04-01 DIAGNOSIS — Z01818 Encounter for other preprocedural examination: Secondary | ICD-10-CM

## 2022-04-01 DIAGNOSIS — Z01812 Encounter for preprocedural laboratory examination: Secondary | ICD-10-CM | POA: Insufficient documentation

## 2022-04-01 DIAGNOSIS — Z794 Long term (current) use of insulin: Secondary | ICD-10-CM | POA: Diagnosis not present

## 2022-04-01 DIAGNOSIS — E119 Type 2 diabetes mellitus without complications: Secondary | ICD-10-CM | POA: Insufficient documentation

## 2022-04-01 HISTORY — DX: Family history of other specified conditions: Z84.89

## 2022-04-01 LAB — CBC
HCT: 47.1 % (ref 39.0–52.0)
Hemoglobin: 16.3 g/dL (ref 13.0–17.0)
MCH: 31.2 pg (ref 26.0–34.0)
MCHC: 34.6 g/dL (ref 30.0–36.0)
MCV: 90.2 fL (ref 80.0–100.0)
Platelets: 308 10*3/uL (ref 150–400)
RBC: 5.22 MIL/uL (ref 4.22–5.81)
RDW: 13.9 % (ref 11.5–15.5)
WBC: 7.1 10*3/uL (ref 4.0–10.5)
nRBC: 0 % (ref 0.0–0.2)

## 2022-04-01 LAB — GLUCOSE, CAPILLARY: Glucose-Capillary: 111 mg/dL — ABNORMAL HIGH (ref 70–99)

## 2022-04-01 LAB — BASIC METABOLIC PANEL
Anion gap: 10 (ref 5–15)
BUN: 12 mg/dL (ref 8–23)
CO2: 22 mmol/L (ref 22–32)
Calcium: 9.7 mg/dL (ref 8.9–10.3)
Chloride: 107 mmol/L (ref 98–111)
Creatinine, Ser: 0.77 mg/dL (ref 0.61–1.24)
GFR, Estimated: >60 mL/min (ref >60)
Glucose, Bld: 101 mg/dL — ABNORMAL HIGH (ref 70–99)
Potassium: 3.7 mmol/L (ref 3.5–5.1)
Sodium: 139 mmol/L (ref 135–145)

## 2022-04-01 LAB — HEMOGLOBIN A1C
Hgb A1c MFr Bld: 6.9 % — ABNORMAL HIGH (ref 4.8–5.6)
Mean Plasma Glucose: 151 mg/dL

## 2022-04-01 LAB — SURGICAL PCR SCREEN
MRSA, PCR: NEGATIVE
Staphylococcus aureus: POSITIVE — AB

## 2022-04-01 NOTE — Progress Notes (Signed)
PCP - Neil Crouch, FNP (Pt is switching providers but will remain in same practice, [place]) Cardiologist - Denies  PPM/ICD - Denies Device Orders - n/a Rep Notified - n/a  Chest x-ray - n/a EKG - 05/20/2021 Stress Test - Per pt, in the 1980s - result normal ECHO - Denies Cardiac Cath - Denies  Sleep Study - Denies CPAP - n/a  Pt is DM2. He has a CGM on his right arm. Normal fasting blood sugar is 90-105. CBG at pre-op appointment 111. Pt has had Oysters and coffee with cream and splenda thus far today.  Last dose of GLP1 agonist- n/a GLP1 instructions: n/a  Blood Thinner Instructions: n/a Aspirin Instructions: Pts last dose of ASA is today, March 13th. He will not take any more doses until after surgery.  ERAS Protcol - Clear liquids until 0430 morning of surgery PRE-SURGERY Ensure or G2- G2 given to pt with instructions  COVID TEST- n/a   Anesthesia review: No.    Patient denies shortness of breath, fever, cough and chest pain at PAT appointment. Pt denies any respiratory illness/infection in the last two months.   All instructions explained to the patient, with a verbal understanding of the material. Patient agrees to go over the instructions while at home for a better understanding. Patient also instructed to self quarantine after being tested for COVID-19. The opportunity to ask questions was provided.

## 2022-04-03 ENCOUNTER — Other Ambulatory Visit: Payer: Self-pay | Admitting: Family Medicine

## 2022-04-03 DIAGNOSIS — F419 Anxiety disorder, unspecified: Secondary | ICD-10-CM

## 2022-04-03 MED ORDER — TRANEXAMIC ACID 1000 MG/10ML IV SOLN
2000.0000 mg | INTRAVENOUS | Status: DC
  Filled 2022-04-03: qty 20

## 2022-04-05 ENCOUNTER — Telehealth: Payer: Self-pay | Admitting: *Deleted

## 2022-04-05 NOTE — Care Plan (Signed)
[facility] RNCM call to patient to discuss his upcoming Left total knee arthroplasty with Dr. Erlinda Hong on Monday, 04/06/22. He is an Ortho bundle patient through [facility] and is agreeable to case management. He lives with his spouse, who will be assisting him at Discharge. He has a home CPM and RW provided by [employer]. Anticipate HHPT will be needed after a short hospital stay. Referral made to [facility] after choice provided. Reviewed all post op care instructions. Will continue to follow for needs.

## 2022-04-05 NOTE — Telephone Encounter (Signed)
Ortho bundle pre-op call completed. 

## 2022-04-06 ENCOUNTER — Encounter (HOSPITAL_COMMUNITY)
Admission: RE | Disposition: A | Discharge status: Home / Self Care | Payer: Self-pay | Source: Home / Self Care | Attending: Orthopaedic Surgery

## 2022-04-06 ENCOUNTER — Observation Stay (HOSPITAL_COMMUNITY)
Admission: RE | Admit: 2022-04-06 | Discharge: 2022-04-07 | Disposition: A | Discharge status: Home / Self Care | Payer: Medicare HMO | Attending: Orthopaedic Surgery | Admitting: Orthopaedic Surgery

## 2022-04-06 ENCOUNTER — Other Ambulatory Visit: Payer: Self-pay

## 2022-04-06 ENCOUNTER — Encounter (HOSPITAL_COMMUNITY): Payer: Self-pay | Admitting: Orthopaedic Surgery

## 2022-04-06 ENCOUNTER — Observation Stay (HOSPITAL_COMMUNITY): Payer: Medicare HMO

## 2022-04-06 ENCOUNTER — Ambulatory Visit (HOSPITAL_COMMUNITY): Payer: Medicare HMO | Admitting: Certified Registered"

## 2022-04-06 ENCOUNTER — Ambulatory Visit (HOSPITAL_BASED_OUTPATIENT_CLINIC_OR_DEPARTMENT_OTHER): Payer: Medicare HMO | Admitting: Certified Registered"

## 2022-04-06 DIAGNOSIS — Z96651 Presence of right artificial knee joint: Secondary | ICD-10-CM | POA: Diagnosis not present

## 2022-04-06 DIAGNOSIS — Z7982 Long term (current) use of aspirin: Secondary | ICD-10-CM | POA: Insufficient documentation

## 2022-04-06 DIAGNOSIS — Z79899 Other long term (current) drug therapy: Secondary | ICD-10-CM | POA: Diagnosis not present

## 2022-04-06 DIAGNOSIS — G8918 Other acute postprocedural pain: Secondary | ICD-10-CM | POA: Diagnosis not present

## 2022-04-06 DIAGNOSIS — Z96652 Presence of left artificial knee joint: Secondary | ICD-10-CM | POA: Diagnosis not present

## 2022-04-06 DIAGNOSIS — I129 Hypertensive chronic kidney disease with stage 1 through stage 4 chronic kidney disease, or unspecified chronic kidney disease: Secondary | ICD-10-CM | POA: Diagnosis not present

## 2022-04-06 DIAGNOSIS — Z7984 Long term (current) use of oral hypoglycemic drugs: Secondary | ICD-10-CM | POA: Insufficient documentation

## 2022-04-06 DIAGNOSIS — E1122 Type 2 diabetes mellitus with diabetic chronic kidney disease: Secondary | ICD-10-CM | POA: Diagnosis not present

## 2022-04-06 DIAGNOSIS — Z471 Aftercare following joint replacement surgery: Secondary | ICD-10-CM | POA: Diagnosis not present

## 2022-04-06 DIAGNOSIS — M1712 Unilateral primary osteoarthritis, left knee: Principal | ICD-10-CM | POA: Diagnosis present

## 2022-04-06 DIAGNOSIS — E1143 Type 2 diabetes mellitus with diabetic autonomic (poly)neuropathy: Secondary | ICD-10-CM | POA: Diagnosis not present

## 2022-04-06 DIAGNOSIS — Z794 Long term (current) use of insulin: Secondary | ICD-10-CM

## 2022-04-06 DIAGNOSIS — N1831 Chronic kidney disease, stage 3a: Secondary | ICD-10-CM | POA: Diagnosis not present

## 2022-04-06 DIAGNOSIS — M21162 Varus deformity, not elsewhere classified, left knee: Secondary | ICD-10-CM | POA: Diagnosis not present

## 2022-04-06 DIAGNOSIS — M24562 Contracture, left knee: Secondary | ICD-10-CM | POA: Diagnosis not present

## 2022-04-06 DIAGNOSIS — Z87891 Personal history of nicotine dependence: Secondary | ICD-10-CM | POA: Diagnosis not present

## 2022-04-06 DIAGNOSIS — E119 Type 2 diabetes mellitus without complications: Secondary | ICD-10-CM

## 2022-04-06 HISTORY — PX: TOTAL KNEE ARTHROPLASTY: SHX125

## 2022-04-06 LAB — GLUCOSE, CAPILLARY
Glucose-Capillary: 110 mg/dL — ABNORMAL HIGH (ref 70–99)
Glucose-Capillary: 108 mg/dL — ABNORMAL HIGH (ref 70–99)
Glucose-Capillary: 229 mg/dL — ABNORMAL HIGH (ref 70–99)
Glucose-Capillary: 257 mg/dL — ABNORMAL HIGH (ref 70–99)

## 2022-04-06 SURGERY — ARTHROPLASTY, KNEE, TOTAL
Anesthesia: Regional | Site: Knee | Laterality: Left

## 2022-04-06 MED ORDER — PROPOFOL 500 MG/50ML IV EMUL
INTRAVENOUS | Status: DC | PRN
  Administered 2022-04-06: 125 ug/kg/min via INTRAVENOUS

## 2022-04-06 MED ORDER — PRONTOSAN WOUND IRRIGATION OPTIME
TOPICAL | Status: DC | PRN
  Administered 2022-04-06: 1 via TOPICAL

## 2022-04-06 MED ORDER — KETOROLAC TROMETHAMINE 15 MG/ML IJ SOLN
7.5000 mg | Freq: Four times a day (QID) | INTRAMUSCULAR | Status: AC
  Administered 2022-04-06 – 2022-04-07 (×4): 7.5 mg via INTRAVENOUS
  Filled 2022-04-06 (×4): qty 1

## 2022-04-06 MED ORDER — ASPIRIN 81 MG PO CHEW
81.0000 mg | CHEWABLE_TABLET | Freq: Two times a day (BID) | ORAL | Status: DC
  Administered 2022-04-07: 81 mg via ORAL
  Filled 2022-04-06: qty 1

## 2022-04-06 MED ORDER — OXYCODONE HCL 5 MG PO TABS
5.0000 mg | ORAL_TABLET | ORAL | Status: DC | PRN
  Administered 2022-04-06 – 2022-04-07 (×3): 10 mg via ORAL
  Administered 2022-04-07: 5 mg via ORAL
  Filled 2022-04-06 (×4): qty 2

## 2022-04-06 MED ORDER — INSULIN ASPART 100 UNIT/ML IJ SOLN
0.0000 [IU] | Freq: Three times a day (TID) | INTRAMUSCULAR | Status: DC
  Administered 2022-04-06: 5 [IU] via SUBCUTANEOUS

## 2022-04-06 MED ORDER — ONDANSETRON HCL 4 MG/2ML IJ SOLN: 4.0000 mg | Freq: Four times a day (QID) | INTRAMUSCULAR | Status: DC | PRN

## 2022-04-06 MED ORDER — AMISULPRIDE (ANTIEMETIC) 5 MG/2ML IV SOLN: 10.0000 mg | Freq: Once | INTRAVENOUS | Status: DC | PRN

## 2022-04-06 MED ORDER — DAPAGLIFLOZIN PROPANEDIOL 10 MG PO TABS
10.0000 mg | ORAL_TABLET | Freq: Every day | ORAL | Status: DC
  Administered 2022-04-07: 10 mg via ORAL
  Filled 2022-04-06: qty 1

## 2022-04-06 MED ORDER — POVIDONE-IODINE 10 % EX SWAB
2.0000 | Freq: Once | CUTANEOUS | Status: AC
  Administered 2022-04-06: 2 via TOPICAL

## 2022-04-06 MED ORDER — DOCUSATE SODIUM 100 MG PO CAPS
100.0000 mg | ORAL_CAPSULE | Freq: Two times a day (BID) | ORAL | Status: DC
  Administered 2022-04-06 – 2022-04-07 (×3): 100 mg via ORAL
  Filled 2022-04-06 (×3): qty 1

## 2022-04-06 MED ORDER — TAMSULOSIN HCL 0.4 MG PO CAPS
0.4000 mg | ORAL_CAPSULE | Freq: Every day | ORAL | Status: DC
  Administered 2022-04-06 – 2022-04-07 (×2): 0.4 mg via ORAL
  Filled 2022-04-06 (×2): qty 1

## 2022-04-06 MED ORDER — FENTANYL CITRATE (PF) 250 MCG/5ML IJ SOLN
INTRAMUSCULAR | Status: DC | PRN
  Administered 2022-04-06 (×2): 50 ug via INTRAVENOUS

## 2022-04-06 MED ORDER — METHOCARBAMOL 500 MG PO TABS
500.0000 mg | ORAL_TABLET | Freq: Four times a day (QID) | ORAL | Status: DC | PRN
  Administered 2022-04-06 – 2022-04-07 (×4): 500 mg via ORAL
  Filled 2022-04-06 (×4): qty 1

## 2022-04-06 MED ORDER — SODIUM CHLORIDE 0.9 % IR SOLN
Status: DC | PRN
  Administered 2022-04-06: 1000 mL

## 2022-04-06 MED ORDER — BUPIVACAINE-MELOXICAM ER 400-12 MG/14ML IJ SOLN
INTRAMUSCULAR | Status: AC
  Filled 2022-04-06: qty 1

## 2022-04-06 MED ORDER — DEXAMETHASONE SODIUM PHOSPHATE 10 MG/ML IJ SOLN
10.0000 mg | Freq: Once | INTRAMUSCULAR | Status: AC
  Administered 2022-04-07: 10 mg via INTRAVENOUS
  Filled 2022-04-06: qty 1

## 2022-04-06 MED ORDER — DEXAMETHASONE SODIUM PHOSPHATE 10 MG/ML IJ SOLN
INTRAMUSCULAR | Status: DC | PRN
  Administered 2022-04-06: 5 mg via INTRAVENOUS

## 2022-04-06 MED ORDER — CEFADROXIL 500 MG PO CAPS
500.0000 mg | ORAL_CAPSULE | Freq: Two times a day (BID) | ORAL | Status: DC
  Administered 2022-04-06 – 2022-04-07 (×2): 500 mg via ORAL
  Filled 2022-04-06 (×3): qty 1

## 2022-04-06 MED ORDER — PROMETHAZINE HCL 25 MG/ML IJ SOLN: 6.2500 mg | INTRAMUSCULAR | Status: DC | PRN

## 2022-04-06 MED ORDER — METOCLOPRAMIDE HCL 5 MG/ML IJ SOLN: 5.0000 mg | Freq: Three times a day (TID) | INTRAMUSCULAR | Status: DC | PRN

## 2022-04-06 MED ORDER — AMLODIPINE BESY-BENAZEPRIL HCL 10-20 MG PO CAPS: 1.0000 | ORAL_CAPSULE | Freq: Every day | ORAL | Status: DC

## 2022-04-06 MED ORDER — MEPERIDINE HCL 25 MG/ML IJ SOLN: 6.2500 mg | INTRAMUSCULAR | Status: DC | PRN

## 2022-04-06 MED ORDER — INSULIN ASPART 100 UNIT/ML IJ SOLN
0.0000 [IU] | Freq: Every day | INTRAMUSCULAR | Status: DC
  Administered 2022-04-06: 3 [IU] via SUBCUTANEOUS

## 2022-04-06 MED ORDER — ACETAMINOPHEN 160 MG/5ML PO SOLN: 325.0000 mg | Freq: Once | ORAL | Status: DC | PRN

## 2022-04-06 MED ORDER — CEFAZOLIN SODIUM-DEXTROSE 2-4 GM/100ML-% IV SOLN
2.0000 g | Freq: Four times a day (QID) | INTRAVENOUS | Status: AC
  Administered 2022-04-06 (×2): 2 g via INTRAVENOUS
  Filled 2022-04-06 (×2): qty 100

## 2022-04-06 MED ORDER — SODIUM CHLORIDE 0.9 % IV SOLN: INTRAVENOUS | Status: DC

## 2022-04-06 MED ORDER — PHENOL 1.4 % MT LIQD: 1.0000 | OROMUCOSAL | Status: DC | PRN

## 2022-04-06 MED ORDER — ALPRAZOLAM 0.5 MG PO TABS
0.5000 mg | ORAL_TABLET | Freq: Two times a day (BID) | ORAL | Status: DC | PRN
  Administered 2022-04-06: 0.5 mg via ORAL
  Filled 2022-04-06: qty 1

## 2022-04-06 MED ORDER — DEXAMETHASONE SODIUM PHOSPHATE 10 MG/ML IJ SOLN
INTRAMUSCULAR | Status: AC
  Filled 2022-04-06: qty 1

## 2022-04-06 MED ORDER — ORAL CARE MOUTH RINSE: 15.0000 mL | Freq: Once | OROMUCOSAL | Status: AC

## 2022-04-06 MED ORDER — FERROUS SULFATE 325 (65 FE) MG PO TABS
325.0000 mg | ORAL_TABLET | Freq: Three times a day (TID) | ORAL | Status: DC
  Administered 2022-04-06 – 2022-04-07 (×4): 325 mg via ORAL
  Filled 2022-04-06 (×4): qty 1

## 2022-04-06 MED ORDER — METOCLOPRAMIDE HCL 5 MG PO TABS: 5.0000 mg | ORAL_TABLET | Freq: Three times a day (TID) | ORAL | Status: DC | PRN

## 2022-04-06 MED ORDER — 0.9 % SODIUM CHLORIDE (POUR BTL) OPTIME
TOPICAL | Status: DC | PRN
  Administered 2022-04-06: 1000 mL

## 2022-04-06 MED ORDER — MIDAZOLAM HCL 2 MG/2ML IJ SOLN
INTRAMUSCULAR | Status: AC
  Filled 2022-04-06: qty 2

## 2022-04-06 MED ORDER — INSULIN ASPART 100 UNIT/ML IJ SOLN: 0.0000 [IU] | INTRAMUSCULAR | Status: DC | PRN

## 2022-04-06 MED ORDER — MIDAZOLAM HCL 2 MG/2ML IJ SOLN
INTRAMUSCULAR | Status: DC | PRN
  Administered 2022-04-06 (×2): 1 mg via INTRAVENOUS

## 2022-04-06 MED ORDER — ACETAMINOPHEN 325 MG PO TABS: 325.0000 mg | ORAL_TABLET | Freq: Once | ORAL | Status: DC | PRN

## 2022-04-06 MED ORDER — ONDANSETRON HCL 4 MG/2ML IJ SOLN
INTRAMUSCULAR | Status: AC
  Filled 2022-04-06: qty 2

## 2022-04-06 MED ORDER — TRANEXAMIC ACID 1000 MG/10ML IV SOLN
INTRAVENOUS | Status: DC | PRN
  Administered 2022-04-06: 2000 mg via TOPICAL

## 2022-04-06 MED ORDER — HYDROMORPHONE HCL 1 MG/ML IJ SOLN: 0.5000 mg | INTRAMUSCULAR | Status: DC | PRN

## 2022-04-06 MED ORDER — ACETAMINOPHEN 10 MG/ML IV SOLN: 1000.0000 mg | Freq: Once | INTRAVENOUS | Status: DC | PRN

## 2022-04-06 MED ORDER — MENTHOL 3 MG MT LOZG: 1.0000 | LOZENGE | OROMUCOSAL | Status: DC | PRN

## 2022-04-06 MED ORDER — VANCOMYCIN HCL 1000 MG IV SOLR
INTRAVENOUS | Status: DC | PRN
  Administered 2022-04-06: 1000 mg via TOPICAL

## 2022-04-06 MED ORDER — METHOCARBAMOL 1000 MG/10ML IJ SOLN: 500.0000 mg | Freq: Four times a day (QID) | INTRAVENOUS | Status: DC | PRN

## 2022-04-06 MED ORDER — CEFAZOLIN SODIUM-DEXTROSE 2-4 GM/100ML-% IV SOLN
2.0000 g | INTRAVENOUS | Status: AC
  Administered 2022-04-06: 2 g via INTRAVENOUS
  Filled 2022-04-06: qty 100

## 2022-04-06 MED ORDER — BENAZEPRIL HCL 20 MG PO TABS
20.0000 mg | ORAL_TABLET | Freq: Every day | ORAL | Status: DC
  Administered 2022-04-06 – 2022-04-07 (×2): 20 mg via ORAL
  Filled 2022-04-06 (×2): qty 1

## 2022-04-06 MED ORDER — PHENYLEPHRINE HCL-NACL 20-0.9 MG/250ML-% IV SOLN
INTRAVENOUS | Status: DC | PRN
  Administered 2022-04-06: 30 ug/min via INTRAVENOUS

## 2022-04-06 MED ORDER — ACETAMINOPHEN 325 MG PO TABS: 325.0000 mg | ORAL_TABLET | Freq: Four times a day (QID) | ORAL | Status: DC | PRN

## 2022-04-06 MED ORDER — TRANEXAMIC ACID-NACL 1000-0.7 MG/100ML-% IV SOLN
1000.0000 mg | Freq: Once | INTRAVENOUS | Status: AC
  Administered 2022-04-06: 1000 mg via INTRAVENOUS
  Filled 2022-04-06: qty 100

## 2022-04-06 MED ORDER — ROPIVACAINE HCL 5 MG/ML IJ SOLN
INTRAMUSCULAR | Status: DC | PRN
  Administered 2022-04-06: 30 mL via PERINEURAL

## 2022-04-06 MED ORDER — LACTATED RINGERS IV SOLN: INTRAVENOUS | Status: DC

## 2022-04-06 MED ORDER — HYDROMORPHONE HCL 1 MG/ML IJ SOLN: 0.2500 mg | INTRAMUSCULAR | Status: DC | PRN

## 2022-04-06 MED ORDER — ACETAMINOPHEN 500 MG PO TABS
1000.0000 mg | ORAL_TABLET | Freq: Four times a day (QID) | ORAL | Status: AC
  Administered 2022-04-06 – 2022-04-07 (×4): 1000 mg via ORAL
  Filled 2022-04-06 (×4): qty 2

## 2022-04-06 MED ORDER — TRANEXAMIC ACID-NACL 1000-0.7 MG/100ML-% IV SOLN
1000.0000 mg | INTRAVENOUS | Status: AC
  Administered 2022-04-06: 1000 mg via INTRAVENOUS
  Filled 2022-04-06: qty 100

## 2022-04-06 MED ORDER — FENTANYL CITRATE (PF) 250 MCG/5ML IJ SOLN
INTRAMUSCULAR | Status: AC
  Filled 2022-04-06: qty 5

## 2022-04-06 MED ORDER — VANCOMYCIN HCL 1000 MG IV SOLR
INTRAVENOUS | Status: AC
  Filled 2022-04-06: qty 20

## 2022-04-06 MED ORDER — BUPIVACAINE IN DEXTROSE 0.75-8.25 % IT SOLN
INTRATHECAL | Status: DC | PRN
  Administered 2022-04-06: 1.6 mL via INTRATHECAL

## 2022-04-06 MED ORDER — INSULIN ASPART 100 UNIT/ML IJ SOLN
0.0000 [IU] | Freq: Three times a day (TID) | INTRAMUSCULAR | Status: DC
  Administered 2022-04-07: 3 [IU] via SUBCUTANEOUS

## 2022-04-06 MED ORDER — OXYCODONE HCL ER 10 MG PO T12A
10.0000 mg | EXTENDED_RELEASE_TABLET | Freq: Two times a day (BID) | ORAL | Status: DC
  Administered 2022-04-06 – 2022-04-07 (×3): 10 mg via ORAL
  Filled 2022-04-06 (×3): qty 1

## 2022-04-06 MED ORDER — AMLODIPINE BESYLATE 10 MG PO TABS
10.0000 mg | ORAL_TABLET | Freq: Every day | ORAL | Status: DC
  Administered 2022-04-06 – 2022-04-07 (×2): 10 mg via ORAL
  Filled 2022-04-06 (×2): qty 1

## 2022-04-06 MED ORDER — BUPIVACAINE-MELOXICAM ER 400-12 MG/14ML IJ SOLN
INTRAMUSCULAR | Status: DC | PRN
  Administered 2022-04-06: 400 mg

## 2022-04-06 MED ORDER — CHLORHEXIDINE GLUCONATE 0.12 % MT SOLN
15.0000 mL | Freq: Once | OROMUCOSAL | Status: AC
  Administered 2022-04-06: 15 mL via OROMUCOSAL
  Filled 2022-04-06: qty 15

## 2022-04-06 MED ORDER — OXYCODONE HCL 5 MG PO TABS: 10.0000 mg | ORAL_TABLET | ORAL | Status: DC | PRN

## 2022-04-06 MED ORDER — ONDANSETRON HCL 4 MG PO TABS: 4.0000 mg | ORAL_TABLET | Freq: Four times a day (QID) | ORAL | Status: DC | PRN

## 2022-04-06 MED ORDER — ONDANSETRON HCL 4 MG/2ML IJ SOLN
INTRAMUSCULAR | Status: DC | PRN
  Administered 2022-04-06: 4 mg via INTRAVENOUS

## 2022-04-06 MED ORDER — PHENYLEPHRINE 80 MCG/ML (10ML) SYRINGE FOR IV PUSH (FOR BLOOD PRESSURE SUPPORT)
PREFILLED_SYRINGE | INTRAVENOUS | Status: DC | PRN
  Administered 2022-04-06 (×2): 80 ug via INTRAVENOUS

## 2022-04-06 MED ORDER — EPHEDRINE SULFATE-NACL 50-0.9 MG/10ML-% IV SOSY
PREFILLED_SYRINGE | INTRAVENOUS | Status: DC | PRN
  Administered 2022-04-06 (×2): 5 mg via INTRAVENOUS

## 2022-04-06 SURGICAL SUPPLY — 92 items
ADH SKN CLS APL DERMABOND .7 (GAUZE/BANDAGES/DRESSINGS) ×1
ALCOHOL 70% 16 OZ (MISCELLANEOUS) ×1 IMPLANT
BAG COUNTER SPONGE SURGICOUNT (BAG) IMPLANT
BAG DECANTER FOR FLEXI CONT (MISCELLANEOUS) ×1 IMPLANT
BAG SPNG CNTER NS LX DISP (BAG) ×1
BANDAGE ESMARK 6X9 LF (GAUZE/BANDAGES/DRESSINGS) IMPLANT
BLADE SAG 18X100X1.27 (BLADE) ×1 IMPLANT
BLADE SAGITTAL 25.0X1.27X90 (BLADE) IMPLANT
BLADE SAW SAG 90X13X1.27 (BLADE) IMPLANT
BLADE SAW SGTL 18X1.27X75 (BLADE) IMPLANT
BLADE SAW SGTL 73X25 THK (BLADE) ×1 IMPLANT
BNDG CMPR 9X6 STRL LF SNTH (GAUZE/BANDAGES/DRESSINGS)
BNDG ESMARK 6X9 LF (GAUZE/BANDAGES/DRESSINGS)
BOWL SMART MIX CTS (DISPOSABLE) ×1 IMPLANT
BSPLAT TIB 5D G CMNT STM LT (Knees) ×1 IMPLANT
CEMENT BONE REFOBACIN R1X40 US (Cement) IMPLANT
CLSR STERI-STRIP ANTIMIC 1/2X4 (GAUZE/BANDAGES/DRESSINGS) ×2 IMPLANT
COMP FEM CEMT PERSONA SZ9 LT (Knees) ×1 IMPLANT
COMP PATELLA 32 STD 8.5 THK (Orthopedic Implant) IMPLANT
COMPONENT FEM CEMT PRNSA SZ9LT (Knees) IMPLANT
COOLER ICEMAN CLASSIC (MISCELLANEOUS) ×1 IMPLANT
COVER SURGICAL LIGHT HANDLE (MISCELLANEOUS) ×1 IMPLANT
CUFF TOURN SGL QUICK 34 (TOURNIQUET CUFF)
CUFF TOURN SGL QUICK 42 (TOURNIQUET CUFF) IMPLANT
CUFF TRNQT CYL 34X4.125X (TOURNIQUET CUFF) ×1 IMPLANT
DERMABOND ADVANCED .7 DNX12 (GAUZE/BANDAGES/DRESSINGS) ×1 IMPLANT
DRAPE EXTREMITY T 121X128X90 (DISPOSABLE) ×1 IMPLANT
DRAPE HALF SHEET 40X57 (DRAPES) ×1 IMPLANT
DRAPE INCISE IOBAN 66X45 STRL (DRAPES) ×1 IMPLANT
DRAPE ORTHO SPLIT 77X108 STRL (DRAPES) ×1
DRAPE POUCH INSTRU U-SHP 10X18 (DRAPES) ×1 IMPLANT
DRAPE SURG ORHT 6 SPLT 77X108 (DRAPES) IMPLANT
DRAPE U-SHAPE 47X51 STRL (DRAPES) ×2 IMPLANT
DRSG AQUACEL AG ADV 3.5X10 (GAUZE/BANDAGES/DRESSINGS) ×1 IMPLANT
DURAPREP 26ML APPLICATOR (WOUND CARE) ×3 IMPLANT
ELECT CAUTERY BLADE 6.4 (BLADE) ×1 IMPLANT
ELECT PENCIL ROCKER SW 15FT (MISCELLANEOUS) ×1 IMPLANT
ELECT REM PT RETURN 9FT ADLT (ELECTROSURGICAL) ×1
ELECTRODE REM PT RTRN 9FT ADLT (ELECTROSURGICAL) ×1 IMPLANT
GLOVE BIOGEL PI IND STRL 7.0 (GLOVE) ×2 IMPLANT
GLOVE BIOGEL PI IND STRL 7.5 (GLOVE) ×5 IMPLANT
GLOVE ECLIPSE 7.0 STRL STRAW (GLOVE) ×3 IMPLANT
GLOVE INDICATOR 7.0 STRL GRN (GLOVE) ×1 IMPLANT
GLOVE INDICATOR 7.5 STRL GRN (GLOVE) ×1 IMPLANT
GLOVE SURG SYN 7.5  E (GLOVE) ×2
GLOVE SURG SYN 7.5 E (GLOVE) ×2 IMPLANT
GLOVE SURG SYN 7.5 PF PI (GLOVE) ×2 IMPLANT
GLOVE SURG UNDER LTX SZ7.5 (GLOVE) ×2 IMPLANT
GLOVE SURG UNDER POLY LF SZ7 (GLOVE) ×2 IMPLANT
GOWN STRL REUS W/ TWL LRG LVL3 (GOWN DISPOSABLE) ×1 IMPLANT
GOWN STRL REUS W/TWL LRG LVL3 (GOWN DISPOSABLE) ×1
GOWN STRL SURGICAL XL XLNG (GOWN DISPOSABLE) ×1 IMPLANT
GOWN TOGA ZIPPER T7+ PEEL AWAY (MISCELLANEOUS) ×2 IMPLANT
HANDPIECE INTERPULSE COAX TIP (DISPOSABLE) ×1
HDLS TROCR DRIL PIN KNEE 75 (PIN) ×1
HOOD PEEL AWAY T7 (MISCELLANEOUS) ×1 IMPLANT
INSERT ARTISURF S8-11 18X22X14 (Insert) IMPLANT
KIT BASIN OR (CUSTOM PROCEDURE TRAY) ×1 IMPLANT
KIT TURNOVER KIT B (KITS) ×1 IMPLANT
MANIFOLD NEPTUNE II (INSTRUMENTS) ×1 IMPLANT
MARKER SKIN DUAL TIP RULER LAB (MISCELLANEOUS) ×2 IMPLANT
NDL SPNL 18GX3.5 QUINCKE PK (NEEDLE) ×1 IMPLANT
NEEDLE SPNL 18GX3.5 QUINCKE PK (NEEDLE) ×1 IMPLANT
NS IRRIG 1000ML POUR BTL (IV SOLUTION) ×1 IMPLANT
PACK TOTAL JOINT (CUSTOM PROCEDURE TRAY) ×1 IMPLANT
PAD ARMBOARD 7.5X6 YLW CONV (MISCELLANEOUS) ×2 IMPLANT
PAD COLD SHLDR WRAP-ON (PAD) ×1 IMPLANT
PATELLA ZIMMER 32MM (Orthopedic Implant) ×1 IMPLANT
PIN DRILL HDLS TROCAR 75 4PK (PIN) IMPLANT
SAW OSC TIP CART 19.5X105X1.3 (SAW) ×1 IMPLANT
SCREW FEMALE HEX FIX 25X2.5 (ORTHOPEDIC DISPOSABLE SUPPLIES) IMPLANT
SET HNDPC FAN SPRY TIP SCT (DISPOSABLE) ×1 IMPLANT
SOLUTION PRONTOSAN WOUND 350ML (IRRIGATION / IRRIGATOR) ×1 IMPLANT
STAPLER VISISTAT 35W (STAPLE) IMPLANT
STEM TIBIA 5 DEG SZ G L KNEE (Knees) IMPLANT
SUCTION FRAZIER HANDLE 10FR (MISCELLANEOUS) ×1
SUCTION TUBE FRAZIER 10FR DISP (MISCELLANEOUS) ×1 IMPLANT
SUT ETHILON 2 0 FS 18 (SUTURE) IMPLANT
SUT MNCRL AB 3-0 PS2 27 (SUTURE) IMPLANT
SUT VIC AB 0 CT1 27 (SUTURE) ×2
SUT VIC AB 0 CT1 27XBRD ANBCTR (SUTURE) ×2 IMPLANT
SUT VIC AB 1 CTX 27 (SUTURE) ×3 IMPLANT
SUT VIC AB 2-0 CT1 27 (SUTURE) ×4
SUT VIC AB 2-0 CT1 TAPERPNT 27 (SUTURE) ×4 IMPLANT
SYR 50ML LL SCALE MARK (SYRINGE) ×2 IMPLANT
TIBIA STEM 5 DEG SZ G L KNEE (Knees) ×1 IMPLANT
TOWEL GREEN STERILE (TOWEL DISPOSABLE) ×1 IMPLANT
TOWEL GREEN STERILE FF (TOWEL DISPOSABLE) ×1 IMPLANT
TRAY CATH INTERMITTENT SS 16FR (CATHETERS) IMPLANT
TUBE SUCT ARGYLE STRL (TUBING) ×1 IMPLANT
UNDERPAD 30X36 HEAVY ABSORB (UNDERPADS AND DIAPERS) ×1 IMPLANT
YANKAUER SUCT BULB TIP NO VENT (SUCTIONS) ×2 IMPLANT

## 2022-04-06 NOTE — Discharge Instructions (Signed)

## 2022-04-06 NOTE — Anesthesia Procedure Notes (Addendum)
Anesthesia Regional Block: Adductor canal block   Pre-Anesthetic Checklist: , timeout performed,  Correct Patient, Correct Site, Correct Laterality,  Correct Procedure, Correct Position, site marked,  Risks and benefits discussed,  Surgical consent,  Pre-op evaluation,  At surgeon's request and post-op pain management  Laterality: Left  Prep: chloraprep       Needles:  Injection technique: Single-shot  Needle Type: Echogenic Stimulator Needle     Needle Length: 9cm  Needle Gauge: 21     Additional Needles:   Procedures:,,,, ultrasound used (permanent image in chart),,    Narrative:  Start time: 04/06/2022 7:05 AM End time: 04/06/2022 7:10 AM Injection made incrementally with aspirations every 5 mL.  Performed by: Personally  Anesthesiologist: Effie Berkshire, MD  Additional Notes: Discussed risks and benefits of the nerve block in detail, including but not limited vascular injury, permanent nerve damage and infection.   Patient tolerated the procedure well. Local anesthetic introduced in an incremental fashion under minimal resistance after negative aspirations. No paresthesias were elicited. After completion of the procedure, no acute issues were identified and patient continued to be monitored by RN.

## 2022-04-06 NOTE — Anesthesia Postprocedure Evaluation (Signed)
Anesthesia Post Note  Patient: Clayton James  Procedure(s) Performed: LEFT TOTAL KNEE ARTHROPLASTY (Left: Knee)     Patient location during evaluation: PACU Anesthesia Type: Spinal Level of consciousness: oriented and awake and alert Pain management: pain level controlled Vital Signs Assessment: post-procedure vital signs reviewed and stable Respiratory status: spontaneous breathing, respiratory function stable and patient connected to nasal cannula oxygen Cardiovascular status: blood pressure returned to baseline and stable Postop Assessment: no headache, no backache and no apparent nausea or vomiting Anesthetic complications: no  No notable events documented.  Last Vitals:  Vitals:   04/06/22 1100 04/06/22 1118  BP: 133/80 (!) 142/90  Pulse: 61 64  Resp: 16 20  Temp: 36.7 C 36.4 C  SpO2: 97% 97%    Last Pain:  Vitals:   04/06/22 1118  TempSrc: Oral  PainSc:                  Effie Berkshire

## 2022-04-06 NOTE — Anesthesia Preprocedure Evaluation (Addendum)
Anesthesia Evaluation  Patient identified by MRN, date of birth, ID band Patient awake    Reviewed: Allergy & Precautions, NPO status , Patient's Chart, lab work & pertinent test results  Airway Mallampati: II  TM Distance: >3 FB Neck ROM: Full    Dental  (+) Dental Advisory Given, Chipped,    Pulmonary former smoker   breath sounds clear to auscultation       Cardiovascular hypertension, Pt. on medications + Peripheral Vascular Disease   Rhythm:Regular Rate:Normal     Neuro/Psych  negative psych ROS   GI/Hepatic negative GI ROS, Neg liver ROS,,,  Endo/Other  diabetes, Type 2, Oral Hypoglycemic Agents    Renal/GU Renal disease     Musculoskeletal  (+) Arthritis ,    Abdominal   Peds  Hematology negative hematology ROS (+)   Anesthesia Other Findings   Reproductive/Obstetrics                             Anesthesia Physical Anesthesia Plan  ASA: 2  Anesthesia Plan: Spinal   Post-op Pain Management: Regional block*   Induction: Intravenous  PONV Risk Score and Plan: 2 and Ondansetron, Propofol infusion, Dexamethasone and Treatment may vary due to age or medical condition  Airway Management Planned: Simple Face Mask and Natural Airway  Additional Equipment: None  Intra-op Plan:   Post-operative Plan:   Informed Consent: I have reviewed the patients History and Physical, chart, labs and discussed the procedure including the risks, benefits and alternatives for the proposed anesthesia with the patient or authorized representative who has indicated his/her understanding and acceptance.       Plan Discussed with: CRNA  Anesthesia Plan Comments: (Lab Results      Component                Value               Date                      WBC                      7.1                 04/01/2022                HGB                      16.3                04/01/2022                HCT                       47.1                04/01/2022                MCV                      90.2                04/01/2022                PLT                      308  04/01/2022           )        Anesthesia Quick Evaluation

## 2022-04-06 NOTE — Transfer of Care (Signed)
Immediate Anesthesia Transfer of Care Note  Patient: Clayton James  Procedure(s) Performed: LEFT TOTAL KNEE ARTHROPLASTY (Left: Knee)  Patient Location: PACU  Anesthesia Type:Regional and Spinal  Level of Consciousness: awake, drowsy, and patient cooperative  Airway & Oxygen Therapy: Patient Spontanous Breathing and Patient connected to nasal cannula oxygen  Post-op Assessment: Report given to RN and Post -op Vital signs reviewed and stable  Post vital signs: Reviewed and stable  Last Vitals:  Vitals Value Taken Time  BP 102/65 04/06/22 0937  Temp    Pulse 69 04/06/22 0940  Resp 20 04/06/22 0940  SpO2 93 % 04/06/22 0940  Vitals shown include unvalidated device data.  Last Pain:  Vitals:   04/06/22 0617  TempSrc:   PainSc: 4          Complications: No notable events documented.

## 2022-04-06 NOTE — Anesthesia Procedure Notes (Addendum)
Spinal  Start time: 04/06/2022 7:25 AM End time: 04/06/2022 7:27 AM Reason for block: surgical anesthesia Staffing Performed: anesthesiologist  Anesthesiologist: Effie Berkshire, MD Performed by: Effie Berkshire, MD Authorized by: Effie Berkshire, MD   Preanesthetic Checklist Completed: patient identified, IV checked, site marked, risks and benefits discussed, surgical consent, monitors and equipment checked, pre-op evaluation and timeout performed Spinal Block Patient position: sitting Prep: DuraPrep and site prepped and draped Location: L3-4 Injection technique: single-shot Needle Needle type: Pencan  Needle gauge: 24 G Needle length: 10 cm Needle insertion depth: 10 cm Additional Notes Patient tolerated well. No immediate complications.  Functioning IV was confirmed and monitors were applied. Sterile prep and drape, including hand hygiene and sterile gloves were used. The patient was positioned and the back was prepped. The skin was anesthetized with lidocaine. Free flow of clear CSF was obtained prior to injecting local anesthetic into the CSF. The spinal needle aspirated freely following injection. The needle was carefully withdrawn. The patient tolerated the procedure well.

## 2022-04-06 NOTE — Evaluation (Signed)
Physical Therapy Evaluation Patient Details Name: Clayton James MRN: [id number] DOB: 1950/07/19 Today's Date: 04/06/2022  History of Present Illness  72 y.o. male presents to [facility] hospital on 04/06/2022 for elective L TKA. PMH includes CKDIII, DMII, HTN, PVD, BPH.  Clinical Impression  Pt presents to PT with deficits in functional mobility, gait, balance, strength, power, ROM. Pt is mobilizing well, ambulating for household distances with support of RW. PT provides cues to improve safety when transitioning from standing to sitting, ensuring pt reaches desired seat before beginning to sit. Pt will benefit from frequent mobilization in an effort to restore independence. PT will follow up tomorrow for further gait and stair training.       Recommendations for follow up therapy are one component of a multi-disciplinary discharge planning process, led by the attending physician.  Recommendations may be updated based on patient status, additional functional criteria and insurance authorization.  Follow Up Recommendations Follow physician's recommendations for discharge plan and follow up therapies      Assistance Recommended at Discharge PRN  Patient can return home with the following  A little help with bathing/dressing/bathroom;Assistance with cooking/housework;Assist for transportation;Help with stairs or ramp for entrance    Equipment Recommendations None recommended by PT  Recommendations for Other Services       Functional Status Assessment Patient has had a recent decline in their functional status and demonstrates the ability to make significant improvements in function in a reasonable and predictable amount of time.     Precautions / Restrictions Precautions Precautions: Knee;Fall Precaution Booklet Issued: Yes (comment) Restrictions Weight Bearing Restrictions: Yes LLE Weight Bearing: Weight bearing as tolerated      Mobility  Bed Mobility Overal bed mobility: Modified  Independent                  Transfers Overall transfer level: Needs assistance Equipment used: Rolling walker (2 wheels) Transfers: Sit to/from Stand Sit to Stand: Supervision                Ambulation/Gait Ambulation/Gait assistance: Supervision Gait Distance (Feet): 150 Feet Assistive device: Rolling walker (2 wheels) Gait Pattern/deviations: Step-through pattern Gait velocity: reduced Gait velocity interpretation: <1.8 ft/sec, indicate of risk for recurrent falls   General Gait Details: step-to gait initially, progresses to step-through with PT cues  Stairs            Wheelchair Mobility    Modified Rankin (Stroke Patients Only)       Balance Overall balance assessment: Needs assistance Sitting-balance support: No upper extremity supported, Feet supported Sitting balance-Leahy Scale: Good     Standing balance support: Single extremity supported, Reliant on assistive device for balance Standing balance-Leahy Scale: Poor                               Pertinent Vitals/Pain Pain Assessment Pain Assessment: 0-10 Pain Score: 6  Pain Location: L knee Pain Descriptors / Indicators: Aching Pain Intervention(s): Monitored during session    Home Living Family/patient expects to be discharged to:: Private residence Living Arrangements: Spouse/significant other Available Help at Discharge: Family Type of Home: House Home Access: Stairs to enter Entrance Stairs-Rails: Psychiatric nurse of Steps: 3   Home Layout: One level Home Equipment: Conservation officer, nature (2 wheels);BSC/3in1;Cane - single point      Prior Function Prior Level of Function : Independent/Modified Independent             Mobility Comments:  no AD use       Hand Dominance        Extremity/Trunk Assessment   Upper Extremity Assessment Upper Extremity Assessment: Overall WFL for tasks assessed    Lower Extremity Assessment Lower Extremity  Assessment: LLE deficits/detail LLE Deficits / Details: post-op ROM restrictions as expected s/p TKA, strength at least 4-/5 based on observation    Cervical / Trunk Assessment Cervical / Trunk Assessment: Normal  Communication   Communication: No difficulties  Cognition Arousal/Alertness: Awake/alert Behavior During Therapy: WFL for tasks assessed/performed Overall Cognitive Status: Within Functional Limits for tasks assessed                                          General Comments General comments (skin integrity, edema, etc.): VSS on RA    Exercises     Assessment/Plan    PT Assessment Patient needs continued PT services  PT Problem List Decreased range of motion;Decreased strength;Decreased activity tolerance;Decreased balance;Decreased mobility;Decreased knowledge of use of DME;Pain       PT Treatment Interventions DME instruction;Gait training;Stair training;Functional mobility training;Therapeutic activities;Therapeutic exercise;Balance training;Neuromuscular re-education;Patient/family education    PT Goals (Current goals can be found in the Care Plan section)  Acute Rehab PT Goals Patient Stated Goal: to get back to fishing by end of april PT Goal Formulation: With patient Time For Goal Achievement: 04/20/22 Potential to Achieve Goals: Good    Frequency 7X/week     Co-evaluation               AM-PAC PT "6 Clicks" Mobility  Outcome Measure Help needed turning from your back to your side while in a flat bed without using bedrails?: None Help needed moving from lying on your back to sitting on the side of a flat bed without using bedrails?: None Help needed moving to and from a bed to a chair (including a wheelchair)?: A Little Help needed standing up from a chair using your arms (e.g., wheelchair or bedside chair)?: A Little Help needed to walk in hospital room?: A Little Help needed climbing 3-5 steps with a railing? : A Little 6 Click  Score: 20    End of Session   Activity Tolerance: Patient tolerated treatment well Patient left: in bed;with call bell/phone within reach;with family/visitor present Nurse Communication: Mobility status PT Visit Diagnosis: Other abnormalities of gait and mobility (R26.89);Muscle weakness (generalized) (M62.81);Pain Pain - Right/Left: Left Pain - part of body: Knee    Time: 1242-1310 PT Time Calculation (min) (ACUTE ONLY): 28 min   Charges:   PT Evaluation $PT Eval Low Complexity: 1 Low          Zenaida Niece, PT, DPT Acute Rehabilitation Office [phone]   Zenaida Niece 04/06/2022, 1:23 PM

## 2022-04-06 NOTE — H&P (Signed)
PREOPERATIVE H&P  Chief Complaint: LEFT KNEE OSTEOARTHRITIS  HPI: Clayton James is a 72 y.o. male who presents for surgical treatment of LEFT KNEE OSTEOARTHRITIS.  He denies any changes in medical history.  Past Medical History:  Diagnosis Date   Arthritis    Benign essential hypertension 04/27/2019   Chronic kidney disease, stage 3a ([place]) 04/27/2019   Family history of adverse reaction to anesthesia    Mothers sister had issues, but pt isn't sure what they were   Mixed hyperlipidemia 04/27/2019   Type 2 diabetes mellitus with diabetic polyneuropathy ([place]) 04/27/2019   Type 2 diabetes mellitus with stage 3 chronic kidney disease ([place]) 04/27/2019   Past Surgical History:  Procedure Laterality Date   COLONOSCOPY     ELBOW SURGERY Left 1994   KNEE ARTHROSCOPY Right 1998   KNEE CARTILAGE SURGERY Left 1994   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   Procedure: RIGHT TOTAL KNEE ARTHROPLASTY;  Surgeon: Leandrew Koyanagi, MD;  Location: [facility];  Service: Orthopedics;  Laterality: Right;   Social History   Socioeconomic History   Marital status: Married    Spouse name: Scientist, water quality   Number of children: 3   Years of education: Not on file   Highest education level: Not on file  Occupational History   Not on file  Tobacco Use   Smoking status: Former    Packs/day: 0.50    Years: 8.00    Additional pack years: 0.00    Total pack years: 4.00    Types: Cigarettes    Quit date: 2000    Years since quitting: 24.2   Smokeless tobacco: Current    Types: Chew   Tobacco comments:    1 can per week  Vaping Use   Vaping Use: Never used  Substance and Sexual Activity   Alcohol use: Not Currently    Alcohol/week: 1.0 - 2.0 standard drink of alcohol    Types: 1 - 2 Standard drinks or equivalent per week   Drug use: Never   Sexual activity: Yes    Partners: Female  Other Topics Concern   Not on file  Social History Narrative   Oldest son and youngest son passed away   Social  Determinants of Health   Financial Resource Strain: Medium Risk (02/11/2022)   Overall Financial Resource Strain (CARDIA)    Difficulty of Paying Living Expenses: Somewhat hard  Food Insecurity: No Food Insecurity (01/01/2022)   Hunger Vital Sign    Worried About Running Out of Food in the Last Year: Never true    Ran Out of Food in the Last Year: Never true  Transportation Needs: No Transportation Needs (02/11/2022)   PRAPARE - Hydrologist (Medical): No    Lack of Transportation (Non-Medical): No  Physical Activity: Insufficiently Active (01/01/2022)   Exercise Vital Sign    Days of Exercise per Week: 3 days    Minutes of Exercise per Session: 30 min  Stress: No Stress Concern Present (01/01/2022)   [place]    Feeling of Stress : Not at all  Social Connections: Not on file   Family History  Problem Relation Age of Onset   Diabetes Mother    Kidney disease Mother    Heart disease Father    Allergies  Allergen Reactions   Sulfamethoxazole Rash   Prior to Admission medications   Medication Sig Start Date End Date Taking? Authorizing Provider  ALPRAZolam (XANAX) 0.5 MG tablet Take 1 tablet (0.5 mg total) by mouth 2 (two) times daily as needed. 03/06/22  Yes Cox, Kirsten, MD  amLODipine-benazepril (LOTREL) 10-20 MG capsule Take 1 capsule by mouth once daily 02/09/22  Yes Cox, Kirsten, MD  aspirin EC 81 MG tablet Take 1 tablet (81 mg total) by mouth 2 (two) times daily. To be taken after surgery to prevent blood clots Patient taking differently: Take 81 mg by mouth daily. To be taken after surgery to prevent blood clots 05/19/21  Yes Aundra Dubin, PA-C  atorvastatin (LIPITOR) 40 MG tablet Take 1 tablet by mouth once daily 01/05/22  Yes Lillard Anes, MD  dapagliflozin propanediol (FARXIGA) 10 MG TABS tablet Take 1 tablet (10 mg total) by mouth daily. 07/30/21  Yes Lillard Anes, MD  diclofenac Sodium (VOLTAREN) 1 % GEL Apply 2 g topically daily as needed (pain).   Yes [provider]  docusate sodium (COLACE) 100 MG capsule Take 1 capsule (100 mg total) by mouth daily as needed. 03/27/22 03/27/23 Yes Aundra Dubin, PA-C  HYDROcodone-acetaminophen (NORCO) 10-325 MG tablet Take 1 tablet by mouth 2 (two) times daily as needed. 03/07/22  Yes Marge Duncans, PA-C  metFORMIN (GLUCOPHAGE) 1000 MG tablet Take 1 tablet by mouth twice daily 03/01/22  Yes Cox, Kirsten, MD  tamsulosin (FLOMAX) 0.4 MG CAPS capsule Take 1 capsule (0.4 mg total) by mouth daily. 06/24/21  Yes Lillard Anes, MD  TRESIBA FLEXTOUCH 100 UNIT/ML FlexTouch Pen INJECT 30 UNITS SUBCUTANEOUSLY ONCE DAILY 10/21/21  Yes Lillard Anes, MD  aspirin EC 81 MG tablet Take 1 tablet (81 mg total) by mouth 2 (two) times daily. To be taken after surgery to prevent blood clots 03/27/22 03/27/23  Aundra Dubin, PA-C  Blood Glucose Monitoring Suppl (ONE TOUCH ULTRA 2) w/Device KIT USE TO CHECK GLUCOSE THREE TIMES DAILY 01/20/19   [provider]  cefadroxil (DURICEF) 500 MG capsule Take 1 capsule (500 mg total) by mouth 2 (two) times daily. To be taken after surgery 03/27/22   Dwana Melena L, PA-C  glucose blood test strip 1 each by Other route daily. Use as instructed 12/23/21   Lillard Anes, MD  Insulin Pen Needle (BD PEN NEEDLE MICRO U/F) 32G X 6 MM MISC USE AS DIRECTED ONCE DAILY 02/20/22   Cox, Elnita Maxwell, MD  methocarbamol (ROBAXIN-750) 750 MG tablet Take 1 tablet (750 mg total) by mouth 2 (two) times daily as needed for muscle spasms. 03/27/22   Aundra Dubin, PA-C  ondansetron (ZOFRAN) 4 MG tablet Take 1 tablet (4 mg total) by mouth every 8 (eight) hours as needed for nausea or vomiting. 03/27/22   Aundra Dubin, PA-C  oxyCODONE-acetaminophen (PERCOCET) 5-325 MG tablet Take 1-2 tablets by mouth every 6 (six) hours as needed. To be taken after surgery 03/27/22   Aundra Dubin, PA-C      Positive ROS: All other systems have been reviewed and were otherwise negative with the exception of those mentioned in the HPI and as above.  Physical Exam: General: Alert, no acute distress Cardiovascular: No pedal edema Respiratory: No cyanosis, no use of accessory musculature GI: abdomen soft Skin: No lesions in the area of chief complaint Neurologic: Sensation intact distally Psychiatric: Patient is competent for consent with normal mood and affect Lymphatic: no lymphedema  MUSCULOSKELETAL: exam stable  Assessment: LEFT KNEE OSTEOARTHRITIS  Plan: Plan for Procedure(s): LEFT TOTAL KNEE ARTHROPLASTY  The risks benefits and alternatives were  discussed with the patient including but not limited to the risks of nonoperative treatment, versus surgical intervention including infection, bleeding, nerve injury,  blood clots, cardiopulmonary complications, morbidity, mortality, among others, and they were willing to proceed.   Eduard Roux, MD 04/06/2022 6:26 AM

## 2022-04-06 NOTE — Op Note (Signed)
Total Knee Arthroplasty Procedure Note  Preoperative diagnosis: Left knee osteoarthritis  Postoperative diagnosis:same  Operative findings: Varus deformity Severe tricompartmental DJD Flexion contracture  Operative procedure: Left total knee arthroplasty. CPT [phone]  Surgeon: N. Eduard Roux, MD  Assist: Madalyn Rob, PA-C; necessary for the timely completion of procedure and due to complexity of procedure.  Anesthesia: Spinal, regional, local  Tourniquet time: see anesthesia record  Implants used: Zimmer persona Femur: CR 9 Tibia: G Patella: 32 mm Polyethylene: 10 mm, MC  Indication: Clayton James is a 72 y.o. year old male with a history of knee pain. Having failed conservative management, the patient elected to proceed with a total knee arthroplasty.  We have reviewed the risk and benefits of the surgery and they elected to proceed after voicing understanding.  Procedure:  After informed consent was obtained and understanding of the risk were voiced including but not limited to bleeding, infection, damage to surrounding structures including nerves and vessels, blood clots, leg length inequality and the failure to achieve desired results, the operative extremity was marked with verbal confirmation of the patient in the holding area.   The patient was then brought to the operating room and transported to the operating room table in the supine position.  A tourniquet was applied to the operative extremity around the upper thigh. The operative limb was then prepped and draped in the usual sterile fashion and preoperative antibiotics were administered.  A time out was performed prior to the start of surgery confirming the correct extremity, preoperative antibiotic administration, as well as team members, implants and instruments available for the case. Correct surgical site was also confirmed with preoperative radiographs. The limb was then elevated for exsanguination and  the tourniquet was inflated. A midline incision was made and a standard medial parapatellar approach was performed.  The infrapatellar fat pad was removed.  Suprapatellar synovium was removed to reveal the anterior distal femoral cortex.  A medial peel was performed to release the capsule of the medial tibial plateau.  The patella was then everted and was prepared and sized to a 32 mm.  A cover was placed on the patella for protection from retractors.  The knee was then brought into full flexion and we then turned our attention to the femur.  The cruciates were sacrificed.  Start site was drilled in the femur and the intramedullary distal femoral cutting guide was placed, set at 3 degrees valgus, taking 12 mm of distal resection. The distal cut was made. Osteophytes were then removed.  Next, the proximal tibial cutting guide was placed with appropriate slope, varus/valgus alignment and depth of resection. The proximal tibial cut was made taking 2 mm off the low side. Gap blocks were then used to assess the extension gap and alignment, and appropriate soft tissue releases were performed. Attention was turned back to the femur, which was sized using the sizing guide to a size 9. Appropriate rotation of the femoral component was determined using epicondylar axis, Whiteside's line, and assessing the flexion gap under ligament tension. The appropriate size 4-in-1 cutting block was placed and checked with an angel wing and cuts were made. Posterior femoral osteophytes and uncapped bone were then removed with the curved osteotome.  Trial components were placed, and stability was checked in full extension, mid-flexion, and deep flexion. Proper tibial rotation was determined and marked.  The patella tracked well without a lateral release.  The femoral lugs were then drilled. Trial components were then removed and  tibial preparation performed.  The tibia was sized for a size G component.   The bony surfaces were irrigated  with a pulse lavage and then dried. Bone cement was vacuum mixed on the back table, and the final components sized above were cemented into place.  Antibiotic irrigation was placed in the knee joint and soft tissues while the cement cured.  After cement had finished curing, excess cement was removed. The stability of the construct was re-evaluated throughout a range of motion and found to be acceptable. The trial liner was removed, the knee was copiously irrigated, and the knee was re-evaluated for any excess bone debris. The real polyethylene liner, 10 mm thick, was inserted and checked to ensure the locking mechanism had engaged appropriately. The tourniquet was deflated and hemostasis was achieved. The wound was irrigated with normal saline.  One gram of vancomycin powder was placed in the surgical bed.  Topical 0.25% bupivacaine and meloxicam was placed in the joint for postoperative pain.  Capsular closure was performed with a #1 vicryl, subcutaneous fat closed with a 0 vicryl suture, then subcutaneous tissue closed with interrupted 2.0 vicryl suture. The skin was then closed with a 2.0 nylon and dermabond. A sterile dressing was applied.  The patient was awakened in the operating room and taken to recovery in stable condition. All sponge, needle, and instrument counts were correct at the end of the case.  Tawanna Cooler was necessary for opening, closing, retracting, limb positioning and overall facilitation and completion of the surgery.  Position: supine  Complications: none.  Time Out: performed   Drains/Packing: none  Estimated blood loss: minimal  Returned to Recovery Room: in good condition.   Antibiotics: yes   Mechanical VTE (DVT) Prophylaxis: sequential compression devices, TED thigh-high  Chemical VTE (DVT) Prophylaxis: aspirin  Fluid Replacement  Crystalloid: see anesthesia record Blood: none  FFP: none   Specimens Removed: 1 to pathology   Sponge and Instrument Count  Correct? yes   PACU: portable radiograph - knee AP and Lateral   Plan/RTC: Return in 2 weeks for wound check.   Weight Bearing/Load Lower Extremity: full   Implant Name Type Inv. Item Serial No. Manufacturer Lot No. LRB No. Used Action  CEMENT BONE REFOBACIN [id number] [place] - [id number] Cement CEMENT BONE REFOBACIN [id number] [place]  ZIMMER RECON(ORTH,TRAU,BIO,SG) [id number] Left 2 Implanted  PATELLA ZIMMER 32MM - [id number] Orthopedic Implant PATELLA ZIMMER 32MM  ZIMMER RECON(ORTH,TRAU,BIO,SG) [id number] Left 1 Implanted  INSERT ARTISURF S8-11 [id number] - [id number] Insert INSERT ARTISURF S8-11 [id number]  ZIMMER RECON(ORTH,TRAU,BIO,SG) [id number] Left 1 Implanted  TIBIA STEM 5 DEG SZ G L KNEE - [id number] Knees TIBIA STEM 5 DEG SZ G L KNEE  ZIMMER RECON(ORTH,TRAU,BIO,SG) [id number] Left 1 Implanted  COMP FEM CEMT PERSONA SZ9 LT - [id number] Knees COMP FEM CEMT PERSONA SZ9 LT  ZIMMER RECON(ORTH,TRAU,BIO,SG) [id number] Left 1 Implanted    N. Eduard Roux, MD [facility] 9:04 AM

## 2022-04-07 ENCOUNTER — Other Ambulatory Visit: Payer: Self-pay | Admitting: Physician Assistant

## 2022-04-07 DIAGNOSIS — E1143 Type 2 diabetes mellitus with diabetic autonomic (poly)neuropathy: Secondary | ICD-10-CM | POA: Diagnosis not present

## 2022-04-07 DIAGNOSIS — N1831 Chronic kidney disease, stage 3a: Secondary | ICD-10-CM | POA: Diagnosis not present

## 2022-04-07 DIAGNOSIS — Z87891 Personal history of nicotine dependence: Secondary | ICD-10-CM | POA: Diagnosis not present

## 2022-04-07 DIAGNOSIS — Z96651 Presence of right artificial knee joint: Secondary | ICD-10-CM | POA: Diagnosis not present

## 2022-04-07 DIAGNOSIS — Z7982 Long term (current) use of aspirin: Secondary | ICD-10-CM | POA: Diagnosis not present

## 2022-04-07 DIAGNOSIS — Z79899 Other long term (current) drug therapy: Secondary | ICD-10-CM | POA: Diagnosis not present

## 2022-04-07 DIAGNOSIS — Z7984 Long term (current) use of oral hypoglycemic drugs: Secondary | ICD-10-CM | POA: Diagnosis not present

## 2022-04-07 DIAGNOSIS — I129 Hypertensive chronic kidney disease with stage 1 through stage 4 chronic kidney disease, or unspecified chronic kidney disease: Secondary | ICD-10-CM | POA: Diagnosis not present

## 2022-04-07 DIAGNOSIS — M1712 Unilateral primary osteoarthritis, left knee: Secondary | ICD-10-CM | POA: Diagnosis not present

## 2022-04-07 DIAGNOSIS — E1122 Type 2 diabetes mellitus with diabetic chronic kidney disease: Secondary | ICD-10-CM | POA: Diagnosis not present

## 2022-04-07 DIAGNOSIS — Z96652 Presence of left artificial knee joint: Secondary | ICD-10-CM | POA: Diagnosis not present

## 2022-04-07 LAB — CBC
HCT: 40.7 % (ref 39.0–52.0)
Hemoglobin: 14 g/dL (ref 13.0–17.0)
MCH: 31 pg (ref 26.0–34.0)
MCHC: 34.4 g/dL (ref 30.0–36.0)
MCV: 90.2 fL (ref 80.0–100.0)
Platelets: 265 10*3/uL (ref 150–400)
RBC: 4.51 MIL/uL (ref 4.22–5.81)
RDW: 13.4 % (ref 11.5–15.5)
WBC: 9.4 10*3/uL (ref 4.0–10.5)
nRBC: 0 % (ref 0.0–0.2)

## 2022-04-07 LAB — GLUCOSE, CAPILLARY: Glucose-Capillary: 163 mg/dL — ABNORMAL HIGH (ref 70–99)

## 2022-04-07 MED ORDER — ASPIRIN 81 MG PO TBEC: 81.0000 mg | DELAYED_RELEASE_TABLET | Freq: Two times a day (BID) | ORAL | 0 refills | Status: AC

## 2022-04-07 MED ORDER — ONDANSETRON HCL 4 MG PO TABS: 4.0000 mg | ORAL_TABLET | Freq: Three times a day (TID) | ORAL | 0 refills | Status: DC | PRN

## 2022-04-07 MED ORDER — DOCUSATE SODIUM 100 MG PO CAPS: 100.0000 mg | ORAL_CAPSULE | Freq: Every day | ORAL | 2 refills | Status: DC | PRN

## 2022-04-07 MED ORDER — CEFADROXIL 500 MG PO CAPS: 500.0000 mg | ORAL_CAPSULE | Freq: Two times a day (BID) | ORAL | 0 refills | Status: DC

## 2022-04-07 MED ORDER — METHOCARBAMOL 750 MG PO TABS: 750.0000 mg | ORAL_TABLET | Freq: Two times a day (BID) | ORAL | 2 refills | Status: DC | PRN

## 2022-04-07 MED ORDER — OXYCODONE-ACETAMINOPHEN 5-325 MG PO TABS: 1.0000 | ORAL_TABLET | Freq: Four times a day (QID) | ORAL | 0 refills | Status: DC | PRN

## 2022-04-07 NOTE — Progress Notes (Signed)
Subjective: 1 Day Post-Op Procedure(s) (LRB): LEFT TOTAL KNEE ARTHROPLASTY (Left) Patient reports pain as mild.    Objective: Vital signs in last 24 hours: Temp:  [97.6 F (36.4 C)-98.4 F (36.9 C)] 97.7 F (36.5 C) (03/19 0743) Pulse Rate:  [57-92] 62 (03/19 0743) Resp:  [7-20] 16 (03/19 0743) BP: (100-142)/(63-90) 139/80 (03/19 0743) SpO2:  [92 %-99 %] 94 % (03/19 0743)  Intake/Output from previous day: 03/18 0701 - 03/19 0700 In: 2840 [P.O.:240; I.V.:2300; IV Piggyback:300] Out: 2900 [Urine:2850; Blood:50] Intake/Output this shift: No intake/output data recorded.  Recent Labs    04/07/22 0621  HGB 14.0   Recent Labs    04/07/22 0621  WBC 9.4  RBC 4.51  HCT 40.7  PLT 265   No results for input(s): "NA", "K", "CL", "CO2", "BUN", "CREATININE", "GLUCOSE", "CALCIUM" in the last 72 hours. No results for input(s): "LABPT", "INR" in the last 72 hours.  Neurologically intact Neurovascular intact Sensation intact distally Intact pulses distally Dorsiflexion/Plantar flexion intact Incision: scant drainage No cellulitis present Compartment soft   Assessment/Plan: 1 Day Post-Op Procedure(s) (LRB): LEFT TOTAL KNEE ARTHROPLASTY (Left) Advance diet Up with therapy D/C IV fluids Discharge home with home health once cleared by PT WBAT LLE       Aundra Dubin 04/07/2022, 9:15 AM

## 2022-04-07 NOTE — Progress Notes (Signed)
OT Cancellation Note  Patient Details Name: Clayton James MRN: [id number] DOB: [birth year]   Cancelled Treatment:    Reason Eval/Treat Not Completed: OT screened, no needs identified, will sign off (Per PT pt does not have acute OT needs and is mod I for ADLs. Will sign off, thank you.)  Elliot Cousin 04/07/2022, 8:49 AM

## 2022-04-07 NOTE — Discharge Summary (Signed)
Patient ID: Clayton James MRN: [id number] DOB/AGE: Aug 12, 1950 72 y.o.  Admit date: 04/06/2022 Discharge date: 04/07/2022  Admission Diagnoses:  Principal Problem:   Primary osteoarthritis of left knee Active Problems:   Status post total left knee replacement   Discharge Diagnoses:  Same  Past Medical History:  Diagnosis Date   Arthritis    Benign essential hypertension 04/27/2019   Chronic kidney disease, stage 3a ([place]) 04/27/2019   Family history of adverse reaction to anesthesia    Mothers sister had issues, but pt isn't sure what they were   Mixed hyperlipidemia 04/27/2019   Type 2 diabetes mellitus with diabetic polyneuropathy ([place]) 04/27/2019   Type 2 diabetes mellitus with stage 3 chronic kidney disease ([place]) 04/27/2019    Surgeries: Procedure(s): LEFT TOTAL KNEE ARTHROPLASTY on 04/06/2022   Consultants:   Discharged Condition: Improved  Hospital Course: Clayton James is an 72 y.o. male who was admitted 04/06/2022 for operative treatment ofPrimary osteoarthritis of left knee. Patient has severe unremitting pain that affects sleep, daily activities, and work/hobbies. After pre-op clearance the patient was taken to the operating room on 04/06/2022 and underwent  Procedure(s): LEFT TOTAL KNEE ARTHROPLASTY.    Patient was given perioperative antibiotics:  Anti-infectives (From admission, onward)    Start     Dose/Rate Route Frequency Ordered Stop   04/06/22 2200  cefadroxil (DURICEF) capsule 500 mg       Note to Pharmacy: To be taken after surgery     500 mg Oral 2 times daily 04/06/22 1102     04/06/22 1000  ceFAZolin (ANCEF) IVPB 2g/100 mL premix        2 g 200 mL/hr over 30 Minutes Intravenous Every 6 hours 04/06/22 0948 04/06/22 1841   04/06/22 0804  vancomycin (VANCOCIN) powder  Status:  Discontinued          As needed 04/06/22 0804 04/06/22 0935   04/06/22 0600  ceFAZolin (ANCEF) IVPB 2g/100 mL premix        2 g 200 mL/hr over 30 Minutes Intravenous On call  to O.R. 04/06/22 0542 04/06/22 0740        Patient was given sequential compression devices, early ambulation, and chemoprophylaxis to prevent DVT.  Patient benefited maximally from hospital stay and there were no complications.    Recent vital signs: Patient Vitals for the past 24 hrs:  BP Temp Temp src Pulse Resp SpO2  04/07/22 0743 139/80 97.7 F (36.5 C) Oral 62 16 94 %  04/07/22 0413 138/82 98 F (36.7 C) Oral 61 18 97 %  04/06/22 2320 111/68 98.4 F (36.9 C) Oral 65 18 92 %  04/06/22 1918 122/77 98.1 F (36.7 C) Oral 87 18 97 %  04/06/22 1607 123/84 97.8 F (36.6 C) Oral 92 20 96 %  04/06/22 1118 (!) 142/90 97.6 F (36.4 C) Oral 64 20 97 %  04/06/22 1100 133/80 98 F (36.7 C) -- 61 16 97 %  04/06/22 1045 138/80 -- -- (!) 58 12 99 %  04/06/22 1030 134/75 -- -- (!) 57 12 97 %  04/06/22 1015 124/71 -- -- (!) 59 (!) 7 97 %  04/06/22 1000 101/63 -- -- 63 13 97 %  04/06/22 0945 100/70 -- -- 69 12 97 %  04/06/22 0939 102/65 97.9 F (36.6 C) -- 84 12 92 %     Recent laboratory studies:  Recent Labs    04/07/22 0621  WBC 9.4  HGB 14.0  HCT 40.7  PLT 265  Discharge Medications:   Allergies as of 04/07/2022       Reactions   Sulfamethoxazole Rash        Medication List     STOP taking these medications    HYDROcodone-acetaminophen 10-325 MG tablet Commonly known as: [place] these medications    ALPRAZolam 0.5 MG tablet Commonly known as: XANAX Take 1 tablet by mouth twice daily as needed   amLODipine-benazepril 10-20 MG capsule Commonly known as: LOTREL Take 1 capsule by mouth once daily   aspirin EC 81 MG tablet Take 1 tablet (81 mg total) by mouth 2 (two) times daily. To be taken after surgery to prevent blood clots What changed: Another medication with the same name was removed. Continue taking this medication, and follow the directions you see here.   atorvastatin 40 MG tablet Commonly known as: LIPITOR Take 1 tablet by mouth  once daily   BD Pen Needle Micro U/F 32G X 6 MM Misc Generic drug: Insulin Pen Needle USE AS DIRECTED ONCE DAILY   cefadroxil 500 MG capsule Commonly known as: DURICEF Take 1 capsule (500 mg total) by mouth 2 (two) times daily. To be taken after surgery   dapagliflozin propanediol 10 MG Tabs tablet Commonly known as: Farxiga Take 1 tablet (10 mg total) by mouth daily.   diclofenac Sodium 1 % Gel Commonly known as: VOLTAREN Apply 2 g topically daily as needed (pain).   docusate sodium 100 MG capsule Commonly known as: Colace Take 1 capsule (100 mg total) by mouth daily as needed.   glucose blood test strip 1 each by Other route daily. Use as instructed   metFORMIN 1000 MG tablet Commonly known as: GLUCOPHAGE Take 1 tablet by mouth twice daily   methocarbamol 750 MG tablet Commonly known as: Robaxin-750 Take 1 tablet (750 mg total) by mouth 2 (two) times daily as needed for muscle spasms.   ondansetron 4 MG tablet Commonly known as: Zofran Take 1 tablet (4 mg total) by mouth every 8 (eight) hours as needed for nausea or vomiting.   ONE TOUCH ULTRA 2 w/Device Kit USE TO CHECK GLUCOSE THREE TIMES DAILY   oxyCODONE-acetaminophen 5-325 MG tablet Commonly known as: Percocet Take 1-2 tablets by mouth every 6 (six) hours as needed. To be taken after surgery   tamsulosin 0.4 MG Caps capsule Commonly known as: FLOMAX Take 1 capsule (0.4 mg total) by mouth daily.   Tyler Aas FlexTouch 100 UNIT/ML FlexTouch Pen Generic drug: insulin degludec INJECT 30 UNITS SUBCUTANEOUSLY ONCE DAILY               Durable Medical Equipment  (From admission, onward)           Start     Ordered   04/06/22 1104  DME Walker rolling  Once       Question Answer Comment  Walker: With 5 Inch Wheels   Patient needs a walker to treat with the following condition Status post left partial knee replacement      04/06/22 1103   04/06/22 1104  DME 3 n 1  Once        04/06/22 1103    04/06/22 1104  DME Bedside commode  Once       Question:  Patient needs a bedside commode to treat with the following condition  Answer:  Status post left partial knee replacement   04/06/22 1103            Diagnostic Studies:  DG Knee Left Port  Result Date: 04/06/2022 CLINICAL DATA:  Status post left knee replacement EXAM: PORTABLE LEFT KNEE - 2 VIEW COMPARISON:  11/26/2021 FINDINGS: Left knee replacement is now seen satisfactory position. No acute bony or soft tissue abnormality is noted. IMPRESSION: Status post left knee replacement Electronically Signed   By: Inez Catalina M.D.   On: 04/06/2022 10:40    Disposition: Discharge disposition: 01-Home or [facility] Follow up.   Specialty: [place] Why: The home health agency will contact you for the first home visit Contact information: [address] [place] [address] [phone]         Leandrew Koyanagi, MD. Schedule an appointment as soon as possible for a visit in 2 week(s).   Specialty: Orthopedic Surgery Contact information: [address] [place] [address] [phone]                  Signed: Aundra Dubin 04/07/2022, 9:19 AM

## 2022-04-07 NOTE — Progress Notes (Signed)
Patient alert and oriented, mae's well, voiding adequate amount of urine, swallowing without difficulty, no c/o pain at time of discharge. Patient discharged home with family. Script and discharged instructions given to patient. Patient and family stated understanding of instructions given. Patient has an appointment with Dr. Xu in 2 weeks ?

## 2022-04-07 NOTE — Progress Notes (Signed)
Physical Therapy Treatment Patient Details Name: Clayton James MRN: [id number] DOB: 1950/04/25 Today's Date: 04/07/2022   History of Present Illness 72 y.o. male presents to [facility] hospital on 04/06/2022 for elective L TKA. PMH includes CKDIII, DMII, HTN, PVD, BPH.    PT Comments    Patient progressing well towards PT goals. Session focused on stair training and ambulation progression. Reports no pain today just tightness. Better able to demonstrate step through gait pattern today and activate quad during gait training. Knee AROM measures ~7-100 degrees. Tolerated therex of LLE without issues. Pt does not have any concerns about returning home. Education on positioning and use of ice to help with inflammation and pain management. Pt safe to return home with support of wife. Will follow if still in the hospital.   Recommendations for follow up therapy are one component of a multi-disciplinary discharge planning process, led by the attending physician.  Recommendations may be updated based on patient status, additional functional criteria and insurance authorization.  Follow Up Recommendations  Follow physician's recommendations for discharge plan and follow up therapies     Assistance Recommended at Discharge PRN  Patient can return home with the following A little help with bathing/dressing/bathroom;Assistance with cooking/housework;Assist for transportation;Help with stairs or ramp for entrance   Equipment Recommendations  None recommended by PT    Recommendations for Other Services       Precautions / Restrictions Precautions Precautions: Knee;Fall Precaution Booklet Issued: Yes (comment) Restrictions Weight Bearing Restrictions: Yes LLE Weight Bearing: Weight bearing as tolerated     Mobility  Bed Mobility Overal bed mobility: Modified Independent                  Transfers Overall transfer level: Needs assistance Equipment used: Rolling walker (2 wheels) Transfers:  Sit to/from Stand Sit to Stand: Modified independent (Device/Increase time)           General transfer comment: Stood from EOB wthout difficulty.    Ambulation/Gait Ambulation/Gait assistance: Supervision Gait Distance (Feet): 200 Feet Assistive device: Rolling walker (2 wheels) Gait Pattern/deviations: Step-through pattern, Trunk flexed Gait velocity: reduced Gait velocity interpretation: <1.31 ft/sec, indicative of household ambulator   General Gait Details: Slow, steady gait wtih cues for knee extension during stance phase and knee flexion during swing with heavy reliance on UEs for support. LLE positiioned in ER   Stairs Stairs: Yes Stairs assistance: Supervision Stair Management: Step to pattern, Two rails, Forwards Number of Stairs: 4 General stair comments: Cues for technique/safety, use of Bil rails, no knee buckling.   Wheelchair Mobility    Modified Rankin (Stroke Patients Only)       Balance Overall balance assessment: Needs assistance Sitting-balance support: No upper extremity supported, Feet supported Sitting balance-Leahy Scale: Good     Standing balance support: During functional activity, Reliant on assistive device for balance Standing balance-Leahy Scale: Poor                              Cognition Arousal/Alertness: Awake/alert Behavior During Therapy: WFL for tasks assessed/performed Overall Cognitive Status: Within Functional Limits for tasks assessed                                          Exercises Total Joint Exercises Quad Sets: AROM, Both, 10 reps, Supine Heel Slides: AROM, Left, 10 reps, Supine Hip  ABduction/ADduction: AROM, Left, 10 reps, Supine Straight Leg Raises: AROM, Left, 10 reps, Supine Long Arc Quad: AROM, Left, 10 reps, Seated Goniometric ROM: ~7-100 degrees knee AROM    General Comments General comments (skin integrity, edema, etc.): Mild weeping through bandage on knee.       Pertinent Vitals/Pain Pain Assessment Pain Assessment: No/denies pain    Home Living                          Prior Function            PT Goals (current goals can now be found in the care plan section) Progress towards PT goals: Progressing toward goals    Frequency    7X/week      PT Plan Current plan remains appropriate    Co-evaluation              AM-PAC PT "6 Clicks" Mobility   Outcome Measure  Help needed turning from your back to your side while in a flat bed without using bedrails?: None Help needed moving from lying on your back to sitting on the side of a flat bed without using bedrails?: None Help needed moving to and from a bed to a chair (including a wheelchair)?: None Help needed standing up from a chair using your arms (e.g., wheelchair or bedside chair)?: A Little Help needed to walk in hospital room?: A Little Help needed climbing 3-5 steps with a railing? : A Little 6 Click Score: 21    End of Session Equipment Utilized During Treatment: Gait belt Activity Tolerance: Patient tolerated treatment well Patient left: in bed;with call bell/phone within reach Nurse Communication: Mobility status PT Visit Diagnosis: Other abnormalities of gait and mobility (R26.89);Muscle weakness (generalized) (M62.81);Pain Pain - Right/Left: Left Pain - part of body: Knee     Time: 0756-0821 PT Time Calculation (min) (ACUTE ONLY): 25 min  Charges:  $Gait Training: 8-22 mins $Therapeutic Exercise: 8-22 mins                     Marisa Severin, PT, DPT Acute Rehabilitation Services Secure chat preferred Office [place] 04/07/2022, 9:13 AM

## 2022-04-08 ENCOUNTER — Telehealth: Payer: Self-pay | Admitting: *Deleted

## 2022-04-08 DIAGNOSIS — Z87891 Personal history of nicotine dependence: Secondary | ICD-10-CM | POA: Diagnosis not present

## 2022-04-08 DIAGNOSIS — Z96651 Presence of right artificial knee joint: Secondary | ICD-10-CM | POA: Diagnosis not present

## 2022-04-08 DIAGNOSIS — N400 Enlarged prostate without lower urinary tract symptoms: Secondary | ICD-10-CM | POA: Diagnosis not present

## 2022-04-08 DIAGNOSIS — Z471 Aftercare following joint replacement surgery: Secondary | ICD-10-CM | POA: Diagnosis not present

## 2022-04-08 DIAGNOSIS — M75101 Unspecified rotator cuff tear or rupture of right shoulder, not specified as traumatic: Secondary | ICD-10-CM | POA: Diagnosis not present

## 2022-04-08 DIAGNOSIS — E1122 Type 2 diabetes mellitus with diabetic chronic kidney disease: Secondary | ICD-10-CM | POA: Diagnosis not present

## 2022-04-08 DIAGNOSIS — Z96652 Presence of left artificial knee joint: Secondary | ICD-10-CM | POA: Diagnosis not present

## 2022-04-08 DIAGNOSIS — E1151 Type 2 diabetes mellitus with diabetic peripheral angiopathy without gangrene: Secondary | ICD-10-CM | POA: Diagnosis not present

## 2022-04-08 DIAGNOSIS — E1142 Type 2 diabetes mellitus with diabetic polyneuropathy: Secondary | ICD-10-CM | POA: Diagnosis not present

## 2022-04-08 DIAGNOSIS — Z7984 Long term (current) use of oral hypoglycemic drugs: Secondary | ICD-10-CM | POA: Diagnosis not present

## 2022-04-08 DIAGNOSIS — N1831 Chronic kidney disease, stage 3a: Secondary | ICD-10-CM | POA: Diagnosis not present

## 2022-04-08 DIAGNOSIS — Z9181 History of falling: Secondary | ICD-10-CM | POA: Diagnosis not present

## 2022-04-08 DIAGNOSIS — I129 Hypertensive chronic kidney disease with stage 1 through stage 4 chronic kidney disease, or unspecified chronic kidney disease: Secondary | ICD-10-CM | POA: Diagnosis not present

## 2022-04-08 DIAGNOSIS — Z794 Long term (current) use of insulin: Secondary | ICD-10-CM | POA: Diagnosis not present

## 2022-04-08 DIAGNOSIS — E782 Mixed hyperlipidemia: Secondary | ICD-10-CM | POA: Diagnosis not present

## 2022-04-08 DIAGNOSIS — Z7982 Long term (current) use of aspirin: Secondary | ICD-10-CM | POA: Diagnosis not present

## 2022-04-08 NOTE — Telephone Encounter (Signed)
Ortho bundle D/C call completed. 

## 2022-04-10 ENCOUNTER — Ambulatory Visit: Payer: Medicare HMO | Admitting: Nurse Practitioner

## 2022-04-10 DIAGNOSIS — E1122 Type 2 diabetes mellitus with diabetic chronic kidney disease: Secondary | ICD-10-CM | POA: Diagnosis not present

## 2022-04-10 DIAGNOSIS — Z471 Aftercare following joint replacement surgery: Secondary | ICD-10-CM | POA: Diagnosis not present

## 2022-04-10 DIAGNOSIS — E1142 Type 2 diabetes mellitus with diabetic polyneuropathy: Secondary | ICD-10-CM | POA: Diagnosis not present

## 2022-04-10 DIAGNOSIS — I129 Hypertensive chronic kidney disease with stage 1 through stage 4 chronic kidney disease, or unspecified chronic kidney disease: Secondary | ICD-10-CM | POA: Diagnosis not present

## 2022-04-10 DIAGNOSIS — Z87891 Personal history of nicotine dependence: Secondary | ICD-10-CM | POA: Diagnosis not present

## 2022-04-10 DIAGNOSIS — Z7984 Long term (current) use of oral hypoglycemic drugs: Secondary | ICD-10-CM | POA: Diagnosis not present

## 2022-04-10 DIAGNOSIS — Z794 Long term (current) use of insulin: Secondary | ICD-10-CM | POA: Diagnosis not present

## 2022-04-10 DIAGNOSIS — N400 Enlarged prostate without lower urinary tract symptoms: Secondary | ICD-10-CM | POA: Diagnosis not present

## 2022-04-10 DIAGNOSIS — E1151 Type 2 diabetes mellitus with diabetic peripheral angiopathy without gangrene: Secondary | ICD-10-CM | POA: Diagnosis not present

## 2022-04-10 DIAGNOSIS — Z96651 Presence of right artificial knee joint: Secondary | ICD-10-CM | POA: Diagnosis not present

## 2022-04-10 DIAGNOSIS — M75101 Unspecified rotator cuff tear or rupture of right shoulder, not specified as traumatic: Secondary | ICD-10-CM | POA: Diagnosis not present

## 2022-04-10 DIAGNOSIS — E782 Mixed hyperlipidemia: Secondary | ICD-10-CM | POA: Diagnosis not present

## 2022-04-10 DIAGNOSIS — Z96652 Presence of left artificial knee joint: Secondary | ICD-10-CM | POA: Diagnosis not present

## 2022-04-10 DIAGNOSIS — N1831 Chronic kidney disease, stage 3a: Secondary | ICD-10-CM | POA: Diagnosis not present

## 2022-04-10 DIAGNOSIS — Z9181 History of falling: Secondary | ICD-10-CM | POA: Diagnosis not present

## 2022-04-10 DIAGNOSIS — Z7982 Long term (current) use of aspirin: Secondary | ICD-10-CM | POA: Diagnosis not present

## 2022-04-13 ENCOUNTER — Other Ambulatory Visit: Payer: Self-pay | Admitting: Orthopedic Surgery

## 2022-04-13 ENCOUNTER — Telehealth: Payer: Self-pay | Admitting: Orthopaedic Surgery

## 2022-04-13 MED ORDER — OXYCODONE-ACETAMINOPHEN 5-325 MG PO TABS: 1.0000 | ORAL_TABLET | Freq: Four times a day (QID) | ORAL | 0 refills | Status: DC | PRN

## 2022-04-13 NOTE — Telephone Encounter (Signed)
Called and advised pt.

## 2022-04-13 NOTE — Telephone Encounter (Signed)
Patient asking if he can get pain medication refill, didn't give name of medication .Marland Kitchen

## 2022-04-14 DIAGNOSIS — Z9181 History of falling: Secondary | ICD-10-CM | POA: Diagnosis not present

## 2022-04-14 DIAGNOSIS — Z96652 Presence of left artificial knee joint: Secondary | ICD-10-CM | POA: Diagnosis not present

## 2022-04-14 DIAGNOSIS — Z7982 Long term (current) use of aspirin: Secondary | ICD-10-CM | POA: Diagnosis not present

## 2022-04-14 DIAGNOSIS — E1151 Type 2 diabetes mellitus with diabetic peripheral angiopathy without gangrene: Secondary | ICD-10-CM | POA: Diagnosis not present

## 2022-04-14 DIAGNOSIS — E1142 Type 2 diabetes mellitus with diabetic polyneuropathy: Secondary | ICD-10-CM | POA: Diagnosis not present

## 2022-04-14 DIAGNOSIS — E782 Mixed hyperlipidemia: Secondary | ICD-10-CM | POA: Diagnosis not present

## 2022-04-14 DIAGNOSIS — Z7984 Long term (current) use of oral hypoglycemic drugs: Secondary | ICD-10-CM | POA: Diagnosis not present

## 2022-04-14 DIAGNOSIS — M75101 Unspecified rotator cuff tear or rupture of right shoulder, not specified as traumatic: Secondary | ICD-10-CM | POA: Diagnosis not present

## 2022-04-14 DIAGNOSIS — E1122 Type 2 diabetes mellitus with diabetic chronic kidney disease: Secondary | ICD-10-CM | POA: Diagnosis not present

## 2022-04-14 DIAGNOSIS — Z471 Aftercare following joint replacement surgery: Secondary | ICD-10-CM | POA: Diagnosis not present

## 2022-04-14 DIAGNOSIS — N1831 Chronic kidney disease, stage 3a: Secondary | ICD-10-CM | POA: Diagnosis not present

## 2022-04-14 DIAGNOSIS — I129 Hypertensive chronic kidney disease with stage 1 through stage 4 chronic kidney disease, or unspecified chronic kidney disease: Secondary | ICD-10-CM | POA: Diagnosis not present

## 2022-04-14 DIAGNOSIS — N400 Enlarged prostate without lower urinary tract symptoms: Secondary | ICD-10-CM | POA: Diagnosis not present

## 2022-04-14 DIAGNOSIS — Z87891 Personal history of nicotine dependence: Secondary | ICD-10-CM | POA: Diagnosis not present

## 2022-04-14 DIAGNOSIS — Z96651 Presence of right artificial knee joint: Secondary | ICD-10-CM | POA: Diagnosis not present

## 2022-04-14 DIAGNOSIS — Z794 Long term (current) use of insulin: Secondary | ICD-10-CM | POA: Diagnosis not present

## 2022-04-15 ENCOUNTER — Other Ambulatory Visit: Payer: Self-pay | Admitting: Orthopaedic Surgery

## 2022-04-15 ENCOUNTER — Telehealth: Payer: Self-pay | Admitting: *Deleted

## 2022-04-15 MED ORDER — OXYCODONE-ACETAMINOPHEN 5-325 MG PO TABS: 1.0000 | ORAL_TABLET | Freq: Four times a day (QID) | ORAL | 0 refills | Status: DC | PRN

## 2022-04-15 NOTE — Telephone Encounter (Signed)
Patient requesting refill of pain medication before the holiday weekend. He is a patient of Dr. Bethann Goo is 1 week post op currently. Pharmacy on chart. Thank you.

## 2022-04-16 DIAGNOSIS — E1142 Type 2 diabetes mellitus with diabetic polyneuropathy: Secondary | ICD-10-CM | POA: Diagnosis not present

## 2022-04-16 DIAGNOSIS — Z87891 Personal history of nicotine dependence: Secondary | ICD-10-CM | POA: Diagnosis not present

## 2022-04-16 DIAGNOSIS — Z7984 Long term (current) use of oral hypoglycemic drugs: Secondary | ICD-10-CM | POA: Diagnosis not present

## 2022-04-16 DIAGNOSIS — Z471 Aftercare following joint replacement surgery: Secondary | ICD-10-CM | POA: Diagnosis not present

## 2022-04-16 DIAGNOSIS — Z96651 Presence of right artificial knee joint: Secondary | ICD-10-CM | POA: Diagnosis not present

## 2022-04-16 DIAGNOSIS — M75101 Unspecified rotator cuff tear or rupture of right shoulder, not specified as traumatic: Secondary | ICD-10-CM | POA: Diagnosis not present

## 2022-04-16 DIAGNOSIS — Z794 Long term (current) use of insulin: Secondary | ICD-10-CM | POA: Diagnosis not present

## 2022-04-16 DIAGNOSIS — I129 Hypertensive chronic kidney disease with stage 1 through stage 4 chronic kidney disease, or unspecified chronic kidney disease: Secondary | ICD-10-CM | POA: Diagnosis not present

## 2022-04-16 DIAGNOSIS — E782 Mixed hyperlipidemia: Secondary | ICD-10-CM | POA: Diagnosis not present

## 2022-04-16 DIAGNOSIS — E1122 Type 2 diabetes mellitus with diabetic chronic kidney disease: Secondary | ICD-10-CM | POA: Diagnosis not present

## 2022-04-16 DIAGNOSIS — Z9181 History of falling: Secondary | ICD-10-CM | POA: Diagnosis not present

## 2022-04-16 DIAGNOSIS — Z7982 Long term (current) use of aspirin: Secondary | ICD-10-CM | POA: Diagnosis not present

## 2022-04-16 DIAGNOSIS — Z96652 Presence of left artificial knee joint: Secondary | ICD-10-CM | POA: Diagnosis not present

## 2022-04-16 DIAGNOSIS — N400 Enlarged prostate without lower urinary tract symptoms: Secondary | ICD-10-CM | POA: Diagnosis not present

## 2022-04-16 DIAGNOSIS — E1151 Type 2 diabetes mellitus with diabetic peripheral angiopathy without gangrene: Secondary | ICD-10-CM | POA: Diagnosis not present

## 2022-04-16 DIAGNOSIS — N1831 Chronic kidney disease, stage 3a: Secondary | ICD-10-CM | POA: Diagnosis not present

## 2022-04-16 NOTE — Telephone Encounter (Signed)
This was done by Dr. Ninfa Linden. Patient aware.

## 2022-04-20 ENCOUNTER — Telehealth: Payer: Self-pay | Admitting: Orthopaedic Surgery

## 2022-04-20 ENCOUNTER — Other Ambulatory Visit: Payer: Self-pay

## 2022-04-20 ENCOUNTER — Other Ambulatory Visit: Payer: Self-pay | Admitting: Physician Assistant

## 2022-04-20 DIAGNOSIS — Z794 Long term (current) use of insulin: Secondary | ICD-10-CM | POA: Diagnosis not present

## 2022-04-20 DIAGNOSIS — Z9181 History of falling: Secondary | ICD-10-CM | POA: Diagnosis not present

## 2022-04-20 DIAGNOSIS — I129 Hypertensive chronic kidney disease with stage 1 through stage 4 chronic kidney disease, or unspecified chronic kidney disease: Secondary | ICD-10-CM | POA: Diagnosis not present

## 2022-04-20 DIAGNOSIS — E782 Mixed hyperlipidemia: Secondary | ICD-10-CM | POA: Diagnosis not present

## 2022-04-20 DIAGNOSIS — E1142 Type 2 diabetes mellitus with diabetic polyneuropathy: Secondary | ICD-10-CM | POA: Diagnosis not present

## 2022-04-20 DIAGNOSIS — N1831 Chronic kidney disease, stage 3a: Secondary | ICD-10-CM | POA: Diagnosis not present

## 2022-04-20 DIAGNOSIS — N400 Enlarged prostate without lower urinary tract symptoms: Secondary | ICD-10-CM | POA: Diagnosis not present

## 2022-04-20 DIAGNOSIS — Z7984 Long term (current) use of oral hypoglycemic drugs: Secondary | ICD-10-CM | POA: Diagnosis not present

## 2022-04-20 DIAGNOSIS — E1122 Type 2 diabetes mellitus with diabetic chronic kidney disease: Secondary | ICD-10-CM | POA: Diagnosis not present

## 2022-04-20 DIAGNOSIS — Z471 Aftercare following joint replacement surgery: Secondary | ICD-10-CM | POA: Diagnosis not present

## 2022-04-20 DIAGNOSIS — E1151 Type 2 diabetes mellitus with diabetic peripheral angiopathy without gangrene: Secondary | ICD-10-CM | POA: Diagnosis not present

## 2022-04-20 DIAGNOSIS — Z7982 Long term (current) use of aspirin: Secondary | ICD-10-CM | POA: Diagnosis not present

## 2022-04-20 DIAGNOSIS — Z87891 Personal history of nicotine dependence: Secondary | ICD-10-CM | POA: Diagnosis not present

## 2022-04-20 DIAGNOSIS — Z96652 Presence of left artificial knee joint: Secondary | ICD-10-CM | POA: Diagnosis not present

## 2022-04-20 DIAGNOSIS — Z96651 Presence of right artificial knee joint: Secondary | ICD-10-CM | POA: Diagnosis not present

## 2022-04-20 DIAGNOSIS — M75101 Unspecified rotator cuff tear or rupture of right shoulder, not specified as traumatic: Secondary | ICD-10-CM | POA: Diagnosis not present

## 2022-04-20 DIAGNOSIS — N401 Enlarged prostate with lower urinary tract symptoms: Secondary | ICD-10-CM

## 2022-04-20 MED ORDER — OXYCODONE-ACETAMINOPHEN 5-325 MG PO TABS: 1.0000 | ORAL_TABLET | Freq: Four times a day (QID) | ORAL | 0 refills | Status: DC | PRN

## 2022-04-20 MED ORDER — TAMSULOSIN HCL 0.4 MG PO CAPS: 0.4000 mg | ORAL_CAPSULE | Freq: Every day | ORAL | 1 refills | Status: DC

## 2022-04-20 NOTE — Telephone Encounter (Signed)
Notified patient.

## 2022-04-20 NOTE — Telephone Encounter (Signed)
Patient states he was unable to get his medication due to the pharmacy advised that they did not like the was the RX was written. Patient states he needs his pain medication ASAP. [facility] [place]. Call patient when done

## 2022-04-20 NOTE — Telephone Encounter (Signed)
Sent in percocet

## 2022-04-20 NOTE — Progress Notes (Unsigned)
   Post-Op Visit Note   Patient: Clayton James           Date of Birth: [birth year]           MRN: [id number] Visit Date: 04/21/2022 PCP: Neil Crouch, FNP (Inactive)   Assessment & Plan:  Chief Complaint: No chief complaint on file.  Visit Diagnoses:  1. Status post total left knee replacement     Plan: ***  Follow-Up Instructions: No follow-ups on file.   Orders:  No orders of the defined types were placed in this encounter.  No orders of the defined types were placed in this encounter.   Imaging: No results found.  PMFS History: Patient Active Problem List   Diagnosis Date Noted   Status post total left knee replacement 04/06/2022   Primary osteoarthritis of right knee 05/26/2021   Status post total right knee replacement 05/26/2021   BPH (benign prostatic hyperplasia) 03/20/2021   Rotator cuff tear, right 12/02/2020   Pigmented nevus 04/18/2020   BMI 25.0-25.9,adult 10/04/2019   PVD (peripheral vascular disease) 08/28/2019   Primary osteoarthritis of left knee 05/04/2019   Mixed hyperlipidemia 04/27/2019   Chronic kidney disease, stage 3a 04/27/2019   Type 2 diabetes mellitus with diabetic polyneuropathy 04/27/2019   Type 2 diabetes mellitus with stage 3 chronic kidney disease 04/27/2019   Benign essential hypertension 04/27/2019   Osteoarthritis of right knee 04/27/2019   Past Medical History:  Diagnosis Date   Arthritis    Benign essential hypertension 04/27/2019   Chronic kidney disease, stage 3a ([place]) 04/27/2019   Family history of adverse reaction to anesthesia    Mothers sister had issues, but pt isn't sure what they were   Mixed hyperlipidemia 04/27/2019   Type 2 diabetes mellitus with diabetic polyneuropathy ([place]) 04/27/2019   Type 2 diabetes mellitus with stage 3 chronic kidney disease ([place]) 04/27/2019    Family History  Problem Relation Age of Onset   Diabetes Mother    Kidney disease Mother    Heart disease Father     Past Surgical  History:  Procedure Laterality Date   COLONOSCOPY     ELBOW SURGERY Left 1994   KNEE ARTHROSCOPY Right 1998   KNEE CARTILAGE SURGERY Left 1994   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   Procedure: RIGHT TOTAL KNEE ARTHROPLASTY;  Surgeon: Leandrew Koyanagi, MD;  Location: [facility];  Service: Orthopedics;  Laterality: Right;   TOTAL KNEE ARTHROPLASTY Left 04/06/2022   Procedure: LEFT TOTAL KNEE ARTHROPLASTY;  Surgeon: Leandrew Koyanagi, MD;  Location: [facility];  Service: Orthopedics;  Laterality: Left;   Social History   Occupational History   Not on file  Tobacco Use   Smoking status: Former    Packs/day: 0.50    Years: 8.00    Additional pack years: 0.00    Total pack years: 4.00    Types: Cigarettes    Quit date: 2000    Years since quitting: 24.2   Smokeless tobacco: Current    Types: Chew   Tobacco comments:    1 can per week  Vaping Use   Vaping Use: Never used  Substance and Sexual Activity   Alcohol use: Not Currently    Alcohol/week: 1.0 - 2.0 standard drink of alcohol    Types: 1 - 2 Standard drinks or equivalent per week   Drug use: Never   Sexual activity: Yes    Partners: Female

## 2022-04-21 ENCOUNTER — Ambulatory Visit (INDEPENDENT_AMBULATORY_CARE_PROVIDER_SITE_OTHER): Payer: Medicare HMO | Admitting: Physician Assistant

## 2022-04-21 DIAGNOSIS — Z7982 Long term (current) use of aspirin: Secondary | ICD-10-CM | POA: Diagnosis not present

## 2022-04-21 DIAGNOSIS — Z96652 Presence of left artificial knee joint: Secondary | ICD-10-CM

## 2022-04-21 DIAGNOSIS — E1122 Type 2 diabetes mellitus with diabetic chronic kidney disease: Secondary | ICD-10-CM | POA: Diagnosis not present

## 2022-04-21 DIAGNOSIS — Z96651 Presence of right artificial knee joint: Secondary | ICD-10-CM | POA: Diagnosis not present

## 2022-04-21 DIAGNOSIS — E1142 Type 2 diabetes mellitus with diabetic polyneuropathy: Secondary | ICD-10-CM | POA: Diagnosis not present

## 2022-04-21 DIAGNOSIS — Z471 Aftercare following joint replacement surgery: Secondary | ICD-10-CM | POA: Diagnosis not present

## 2022-04-21 DIAGNOSIS — Z794 Long term (current) use of insulin: Secondary | ICD-10-CM | POA: Diagnosis not present

## 2022-04-21 DIAGNOSIS — E1151 Type 2 diabetes mellitus with diabetic peripheral angiopathy without gangrene: Secondary | ICD-10-CM | POA: Diagnosis not present

## 2022-04-21 DIAGNOSIS — Z87891 Personal history of nicotine dependence: Secondary | ICD-10-CM | POA: Diagnosis not present

## 2022-04-21 DIAGNOSIS — E782 Mixed hyperlipidemia: Secondary | ICD-10-CM | POA: Diagnosis not present

## 2022-04-21 DIAGNOSIS — N400 Enlarged prostate without lower urinary tract symptoms: Secondary | ICD-10-CM | POA: Diagnosis not present

## 2022-04-21 DIAGNOSIS — I129 Hypertensive chronic kidney disease with stage 1 through stage 4 chronic kidney disease, or unspecified chronic kidney disease: Secondary | ICD-10-CM | POA: Diagnosis not present

## 2022-04-21 DIAGNOSIS — N1831 Chronic kidney disease, stage 3a: Secondary | ICD-10-CM | POA: Diagnosis not present

## 2022-04-21 DIAGNOSIS — M75101 Unspecified rotator cuff tear or rupture of right shoulder, not specified as traumatic: Secondary | ICD-10-CM | POA: Diagnosis not present

## 2022-04-21 DIAGNOSIS — Z9181 History of falling: Secondary | ICD-10-CM | POA: Diagnosis not present

## 2022-04-21 DIAGNOSIS — Z7984 Long term (current) use of oral hypoglycemic drugs: Secondary | ICD-10-CM | POA: Diagnosis not present

## 2022-04-22 ENCOUNTER — Telehealth: Payer: Self-pay | Admitting: *Deleted

## 2022-04-22 DIAGNOSIS — M1712 Unilateral primary osteoarthritis, left knee: Secondary | ICD-10-CM | POA: Diagnosis not present

## 2022-04-22 DIAGNOSIS — M25562 Pain in left knee: Secondary | ICD-10-CM | POA: Diagnosis not present

## 2022-04-22 DIAGNOSIS — Z96652 Presence of left artificial knee joint: Secondary | ICD-10-CM | POA: Diagnosis not present

## 2022-04-22 NOTE — Telephone Encounter (Signed)
Ortho bundle 14 day in office meeting completed. °

## 2022-04-24 ENCOUNTER — Telehealth: Payer: Self-pay

## 2022-04-24 DIAGNOSIS — Z96652 Presence of left artificial knee joint: Secondary | ICD-10-CM | POA: Diagnosis not present

## 2022-04-24 DIAGNOSIS — M1712 Unilateral primary osteoarthritis, left knee: Secondary | ICD-10-CM | POA: Diagnosis not present

## 2022-04-24 DIAGNOSIS — M25562 Pain in left knee: Secondary | ICD-10-CM | POA: Diagnosis not present

## 2022-04-24 NOTE — Progress Notes (Unsigned)
Care Management & Coordination Services Pharmacy Team  Reason for Encounter: Diabetes  Contacted patient to discuss diabetes disease state. {US HC Outreach:28874}  Current antihyperglycemic regimen:  Metformin 1000 mg twice daily Farxiga 10 mg daily Tresiba 30 units daily   Patient verbally confirms he is taking the above medications as directed. {yes/no:20286}  What diet changes have been made to improve diabetes control?  What recent interventions/DTPs have been made to improve glycemic control:  ***  Have there been any recent hospitalizations or ED visits since last visit with PharmD? {yes/no:20286}  Patient {reports/denies:24182} hypoglycemic symptoms, including {Hypoglycemic Symptoms:3049003}  Patient {reports/denies:24182} hyperglycemic symptoms, including {symptoms; hyperglycemia:17903}  How often are you checking your blood sugar? {BG Testing frequency:23922}  What are your blood sugars ranging?  Fasting: *** Before meals: *** After meals: *** Bedtime: ***  During the week, how often does your blood glucose drop below 70? {LowBGfrequency:24142}  Are you checking your feet daily/regularly? {yes/no:20286}  Adherence Review: Is the patient currently on a STATIN medication? Yes Is the patient currently on ACE/ARB medication? Yes Does the patient have >5 day gap between last estimated fill dates? Yes   Chart Updates: Recent office visits:  None  Recent consult visits:  04-21-2022 Clayton James (Orthopedic surgery). Follow up visit for left knee replacement.  04-06-2022 Clayton Kos, Clayton James (Neurosurgery). Total left knee replacement procedure completed. Stop oxycodone.  Hospital visits:  None in previous 6 months  Medications: Outpatient Encounter Medications as of 04/24/2022  Medication Sig   ALPRAZolam (XANAX) 0.5 MG tablet Take 1 tablet by mouth twice daily as needed   amLODipine-benazepril (LOTREL) 10-20 MG capsule Take 1 capsule by mouth once  daily   aspirin EC 81 MG tablet Take 1 tablet (81 mg total) by mouth 2 (two) times daily. To be taken after surgery to prevent blood clots   atorvastatin (LIPITOR) 40 MG tablet Take 1 tablet by mouth once daily   Blood Glucose Monitoring Suppl (ONE TOUCH ULTRA 2) w/Device KIT USE TO CHECK GLUCOSE THREE TIMES DAILY   cefadroxil (DURICEF) 500 MG capsule Take 1 capsule (500 mg total) by mouth 2 (two) times daily. To be taken after surgery   dapagliflozin propanediol (FARXIGA) 10 MG TABS tablet Take 1 tablet (10 mg total) by mouth daily.   diclofenac Sodium (VOLTAREN) 1 % GEL Apply 2 g topically daily as needed (pain).   docusate sodium (COLACE) 100 MG capsule Take 1 capsule (100 mg total) by mouth daily as needed.   glucose blood test strip 1 each by Other route daily. Use as instructed   Insulin Pen Needle (BD PEN NEEDLE MICRO U/F) 32G X 6 MM MISC USE AS DIRECTED ONCE DAILY   metFORMIN (GLUCOPHAGE) 1000 MG tablet Take 1 tablet by mouth twice daily   methocarbamol (ROBAXIN-750) 750 MG tablet Take 1 tablet (750 mg total) by mouth 2 (two) times daily as needed for muscle spasms.   ondansetron (ZOFRAN) 4 MG tablet Take 1 tablet (4 mg total) by mouth every 8 (eight) hours as needed for nausea or vomiting.   oxyCODONE-acetaminophen (PERCOCET) 5-325 MG tablet Take 1 tablet by mouth every 6 (six) hours as needed.   tamsulosin (FLOMAX) 0.4 MG CAPS capsule Take 1 capsule (0.4 mg total) by mouth daily.   TRESIBA FLEXTOUCH 100 UNIT/ML FlexTouch Pen INJECT 30 UNITS SUBCUTANEOUSLY ONCE DAILY   No facility-administered encounter medications on file as of 04/24/2022.    Recent Relevant Labs: Lab Results  Component Value Date/Time  HGBA1C 6.9 (H) 04/01/2022 11:53 AM   HGBA1C 6.8 (H) 03/10/2022 10:45 AM   MICROALBUR 80 05/17/2019 09:03 AM    Kidney Function Lab Results  Component Value Date/Time   CREATININE 0.77 04/01/2022 11:53 AM   CREATININE 0.77 03/10/2022 10:45 AM   GFRNONAA >60 04/01/2022 11:53 AM    GFRAA 109 02/15/2020 10:01 AM  04-24-2022: 1st attempt left VM  Star Rating Drugs: Atorvastatin 40 mg- Last filled 04-06-2022 90 DS. Previous 01-06-2022 90 DS Farxiga 10 mg- Last filled 04-06-2022 30 DS. Previous 03-04-2022 30 DS Lotrel 10-20 mg- Last filled 02-09-2022 90 DS . Previous 11-03-2021 90 DS Metformin 1000 mg- Last filled 03-02-2022 90 DS. Previous 11-30-2021 90 DS.  Care Gaps: Annual wellness visit in last year? Yes Last eye exam / retinopathy screening: Last diabetic foot exam: None  Tdap overdue Shingrix overdue Eye exam overdue  Clayton James [facility] Surgery CenterCMA Clinical Pharmacist Assistant [phone]

## 2022-04-27 ENCOUNTER — Telehealth: Payer: Self-pay | Admitting: Orthopaedic Surgery

## 2022-04-27 ENCOUNTER — Other Ambulatory Visit: Payer: Self-pay | Admitting: Physician Assistant

## 2022-04-27 DIAGNOSIS — M25562 Pain in left knee: Secondary | ICD-10-CM | POA: Diagnosis not present

## 2022-04-27 DIAGNOSIS — E1142 Type 2 diabetes mellitus with diabetic polyneuropathy: Secondary | ICD-10-CM | POA: Diagnosis not present

## 2022-04-27 DIAGNOSIS — M1712 Unilateral primary osteoarthritis, left knee: Secondary | ICD-10-CM | POA: Diagnosis not present

## 2022-04-27 DIAGNOSIS — E1121 Type 2 diabetes mellitus with diabetic nephropathy: Secondary | ICD-10-CM | POA: Diagnosis not present

## 2022-04-27 DIAGNOSIS — Z96652 Presence of left artificial knee joint: Secondary | ICD-10-CM | POA: Diagnosis not present

## 2022-04-27 MED ORDER — OXYCODONE-ACETAMINOPHEN 5-325 MG PO TABS: 1.0000 | ORAL_TABLET | Freq: Three times a day (TID) | ORAL | 0 refills | Status: DC | PRN

## 2022-04-27 NOTE — Telephone Encounter (Signed)
Notified patient.

## 2022-04-27 NOTE — Telephone Encounter (Signed)
Sent.  Please let him know frequency has changed

## 2022-04-27 NOTE — Telephone Encounter (Signed)
Patient asking for pain medication refill (Oxycodone 5/325 mg) please advise call into [facility] in [place]

## 2022-04-29 DIAGNOSIS — M25562 Pain in left knee: Secondary | ICD-10-CM | POA: Diagnosis not present

## 2022-04-29 DIAGNOSIS — M1712 Unilateral primary osteoarthritis, left knee: Secondary | ICD-10-CM | POA: Diagnosis not present

## 2022-04-29 DIAGNOSIS — Z96652 Presence of left artificial knee joint: Secondary | ICD-10-CM | POA: Diagnosis not present

## 2022-04-30 NOTE — Telephone Encounter (Cosign Needed)
Note updated

## 2022-05-04 ENCOUNTER — Other Ambulatory Visit: Payer: Self-pay

## 2022-05-04 DIAGNOSIS — M25562 Pain in left knee: Secondary | ICD-10-CM | POA: Diagnosis not present

## 2022-05-04 DIAGNOSIS — Z96652 Presence of left artificial knee joint: Secondary | ICD-10-CM | POA: Diagnosis not present

## 2022-05-04 DIAGNOSIS — M1712 Unilateral primary osteoarthritis, left knee: Secondary | ICD-10-CM | POA: Diagnosis not present

## 2022-05-04 MED ORDER — DAPAGLIFLOZIN PROPANEDIOL 10 MG PO TABS: 10.0000 mg | ORAL_TABLET | Freq: Every day | ORAL | 2 refills | Status: DC

## 2022-05-05 ENCOUNTER — Other Ambulatory Visit: Payer: Self-pay

## 2022-05-05 DIAGNOSIS — F419 Anxiety disorder, unspecified: Secondary | ICD-10-CM

## 2022-05-05 MED ORDER — ALPRAZOLAM 0.5 MG PO TABS: 0.5000 mg | ORAL_TABLET | Freq: Two times a day (BID) | ORAL | 0 refills | Status: DC | PRN

## 2022-05-07 ENCOUNTER — Telehealth: Payer: Self-pay

## 2022-05-07 DIAGNOSIS — Z96652 Presence of left artificial knee joint: Secondary | ICD-10-CM | POA: Diagnosis not present

## 2022-05-07 DIAGNOSIS — M1712 Unilateral primary osteoarthritis, left knee: Secondary | ICD-10-CM | POA: Diagnosis not present

## 2022-05-07 DIAGNOSIS — M25562 Pain in left knee: Secondary | ICD-10-CM | POA: Diagnosis not present

## 2022-05-07 NOTE — Telephone Encounter (Signed)
[facility] medical supply called and needed last note faxed for patient to receive his libre. Last note was faxed to [phone] as requested.

## 2022-05-11 DIAGNOSIS — M1712 Unilateral primary osteoarthritis, left knee: Secondary | ICD-10-CM | POA: Diagnosis not present

## 2022-05-11 DIAGNOSIS — Z96652 Presence of left artificial knee joint: Secondary | ICD-10-CM | POA: Diagnosis not present

## 2022-05-11 DIAGNOSIS — M25562 Pain in left knee: Secondary | ICD-10-CM | POA: Diagnosis not present

## 2022-05-13 ENCOUNTER — Other Ambulatory Visit: Payer: Self-pay

## 2022-05-13 DIAGNOSIS — I10 Essential (primary) hypertension: Secondary | ICD-10-CM

## 2022-05-13 MED ORDER — AMLODIPINE BESY-BENAZEPRIL HCL 10-20 MG PO CAPS
1.0000 | ORAL_CAPSULE | Freq: Every day | ORAL | 0 refills | Status: DC
Start: 2022-05-13 — End: 2022-08-17

## 2022-05-14 ENCOUNTER — Other Ambulatory Visit: Payer: Self-pay

## 2022-05-14 DIAGNOSIS — Z96652 Presence of left artificial knee joint: Secondary | ICD-10-CM | POA: Diagnosis not present

## 2022-05-14 DIAGNOSIS — E1142 Type 2 diabetes mellitus with diabetic polyneuropathy: Secondary | ICD-10-CM

## 2022-05-14 DIAGNOSIS — M1712 Unilateral primary osteoarthritis, left knee: Secondary | ICD-10-CM | POA: Diagnosis not present

## 2022-05-14 DIAGNOSIS — M25562 Pain in left knee: Secondary | ICD-10-CM | POA: Diagnosis not present

## 2022-05-14 MED ORDER — TRESIBA FLEXTOUCH 100 UNIT/ML ~~LOC~~ SOPN
PEN_INJECTOR | SUBCUTANEOUS | 3 refills | Status: DC
Start: 2022-05-14 — End: 2022-12-04

## 2022-05-19 DIAGNOSIS — Z96652 Presence of left artificial knee joint: Secondary | ICD-10-CM | POA: Diagnosis not present

## 2022-05-19 DIAGNOSIS — M1712 Unilateral primary osteoarthritis, left knee: Secondary | ICD-10-CM | POA: Diagnosis not present

## 2022-05-19 DIAGNOSIS — M25562 Pain in left knee: Secondary | ICD-10-CM | POA: Diagnosis not present

## 2022-05-27 ENCOUNTER — Other Ambulatory Visit (INDEPENDENT_AMBULATORY_CARE_PROVIDER_SITE_OTHER): Payer: Medicare HMO

## 2022-05-27 ENCOUNTER — Telehealth: Payer: Self-pay

## 2022-05-27 ENCOUNTER — Encounter: Payer: Self-pay | Admitting: Orthopaedic Surgery

## 2022-05-27 ENCOUNTER — Ambulatory Visit: Payer: Medicare HMO | Admitting: Orthopaedic Surgery

## 2022-05-27 DIAGNOSIS — Z96652 Presence of left artificial knee joint: Secondary | ICD-10-CM | POA: Diagnosis not present

## 2022-05-27 DIAGNOSIS — Z96651 Presence of right artificial knee joint: Secondary | ICD-10-CM | POA: Diagnosis not present

## 2022-05-27 DIAGNOSIS — E1121 Type 2 diabetes mellitus with diabetic nephropathy: Secondary | ICD-10-CM | POA: Diagnosis not present

## 2022-05-27 DIAGNOSIS — E1142 Type 2 diabetes mellitus with diabetic polyneuropathy: Secondary | ICD-10-CM | POA: Diagnosis not present

## 2022-05-27 MED ORDER — DICLOFENAC SODIUM 75 MG PO TBEC: 75.0000 mg | DELAYED_RELEASE_TABLET | Freq: Two times a day (BID) | ORAL | 2 refills | Status: DC | PRN

## 2022-05-27 NOTE — Progress Notes (Signed)
Post-Op Visit Note   Patient: Clayton James           Date of Birth: 06/03/1950           MRN: [id number] Visit Date: 05/27/2022 PCP: Lurline Del, FNP (Inactive)   Assessment & Plan:  Chief Complaint:  Chief Complaint  Patient presents with   Left Knee - Follow-up    Left total knee arthroplasty 04/06/2022   Visit Diagnoses:  1. Status post total left knee replacement   2. Status post total right knee replacement     Plan: Patient is a pleasant 72 year old gentleman who comes in today 6 weeks status post left total knee replacement 04/06/2022 and 1 year status post right total knee replacement 05/26/2021.  He is doing great with both knees.  He has finished physical therapy with the left knee.  He is not taking anything for pain.  He has been compliant taking a baby aspirin twice daily for DVT prophylaxis.  Examination of both knees reveals fully healed surgical scars without complication.  Range of motion 0 to 125 degrees.  Stable to valgus and varus stress.  He is neurovascular intact distally.  At this point, he is doing well.  Continue with a home exercise program.  Advance activity as tolerated.  He may stop taking a baby aspirin twice daily for DVT prophylaxis.  Follow-up with [place] in 6 weeks for repeat evaluation of the left knee.  Call with concerns or questions.  Follow-Up Instructions: Return in about 6 weeks (around 07/08/2022).   Orders:  Orders Placed This Encounter  Procedures   XR Knee 1-2 Views Right   XR Knee 1-2 Views Left   No orders of the defined types were placed in this encounter.   Imaging: No results found.  PMFS History: Patient Active Problem List   Diagnosis Date Noted   Status post total left knee replacement 04/06/2022   Primary osteoarthritis of right knee 05/26/2021   Status post total right knee replacement 05/26/2021   BPH (benign prostatic hyperplasia) 03/20/2021   Rotator cuff tear, right 12/02/2020   Pigmented nevus 04/18/2020   BMI  25.0-25.9,adult 10/04/2019   PVD (peripheral vascular disease) (HCC) 08/28/2019   Primary osteoarthritis of left knee 05/04/2019   Mixed hyperlipidemia 04/27/2019   Chronic kidney disease, stage 3a (HCC) 04/27/2019   Type 2 diabetes mellitus with diabetic polyneuropathy (HCC) 04/27/2019   Type 2 diabetes mellitus with stage 3 chronic kidney disease (HCC) 04/27/2019   Benign essential hypertension 04/27/2019   Osteoarthritis of right knee 04/27/2019   Past Medical History:  Diagnosis Date   Arthritis    Benign essential hypertension 04/27/2019   Chronic kidney disease, stage 3a (HCC) 04/27/2019   Family history of adverse reaction to anesthesia    Mothers sister had issues, but pt isn't sure what they were   Mixed hyperlipidemia 04/27/2019   Type 2 diabetes mellitus with diabetic polyneuropathy (HCC) 04/27/2019   Type 2 diabetes mellitus with stage 3 chronic kidney disease (HCC) 04/27/2019    Family History  Problem Relation Age of Onset   Diabetes Mother    Kidney disease Mother    Heart disease Father     Past Surgical History:  Procedure Laterality Date   COLONOSCOPY     ELBOW SURGERY Left 1994   KNEE ARTHROSCOPY Right 1998   KNEE CARTILAGE SURGERY Left 1994   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   Procedure: RIGHT TOTAL KNEE ARTHROPLASTY;  Surgeon: Tarry Kos, MD;  Location: [facility] OR;  Service: Orthopedics;  Laterality: Right;   TOTAL KNEE ARTHROPLASTY Left 04/06/2022   Procedure: LEFT TOTAL KNEE ARTHROPLASTY;  Surgeon: Tarry Kos, MD;  Location: [facility] OR;  Service: Orthopedics;  Laterality: Left;   Social History   Occupational History   Not on file  Tobacco Use   Smoking status: Former    Packs/day: 0.50    Years: 8.00    Additional pack years: 0.00    Total pack years: 4.00    Types: Cigarettes    Quit date: 2000    Years since quitting: 24.3   Smokeless tobacco: Current    Types: Chew   Tobacco comments:    1 can per week  Vaping Use   Vaping Use:  Never used  Substance and Sexual Activity   Alcohol use: Not Currently    Alcohol/week: 1.0 - 2.0 standard drink of alcohol    Types: 1 - 2 Standard drinks or equivalent per week   Drug use: Never   Sexual activity: Yes    Partners: Female

## 2022-05-27 NOTE — Progress Notes (Signed)
Care Management & Coordination Services Pharmacy Team  Reason for Encounter: Diabetes  Contacted patient to discuss diabetes disease state. Spoke with patient on 05/28/2022   Current antihyperglycemic regimen:  Metformin 1000 mg twice daily Farxiga 10 mg daily Tresiba 30 units daily   Patient verbally confirms he is taking the above medications as directed. Yes  What diet changes have been made to improve diabetes control?Patient stated no major changes just watching what he eats and taking medications as prescribed and drinking plenty of water daily.  What recent interventions/DTPs have been made to improve glycemic control:  Patient stated he feels he is doing great. Not sure why his sugar is below 70 some mornings but he doesn't feel any symptoms.  Have there been any recent hospitalizations or ED visits since last visit with PharmD? Yes  Patient denies hypoglycemic symptoms  Patient denies hyperglycemic symptoms  How often are you checking your blood sugar? once daily  What are your blood sugars ranging?  Fasting: 04-29 74, 04-28 113, 04-27 67 after eating 113, 04-25 102, 04-24 95, 04-23 106, 04-22 115, 04-21 101, 04-20 158 Before meals: None After meals: None Bedtime: None  During the week, how often does your blood glucose drop below 70? Once a week  Are you checking your feet daily/regularly? Yes  Adherence Review: Is the patient currently on a STATIN medication? Yes Is the patient currently on ACE/ARB medication? Yes Does the patient have >5 day gap between last estimated fill dates? Yes   Chart Updates: Recent office visits:  None  Recent consult visits:  05-27-2022 Tarry Kos, MD (Orthopedic surgery). Post op visit for left knee replacement   Hospital visits:  None in previous 6 months  Medications: Outpatient Encounter Medications as of 05/27/2022  Medication Sig   ALPRAZolam (XANAX) 0.5 MG tablet Take 1 tablet (0.5 mg total) by mouth 2 (two) times  daily as needed.   amLODipine-benazepril (LOTREL) 10-20 MG capsule Take 1 capsule by mouth daily.   aspirin EC 81 MG tablet Take 1 tablet (81 mg total) by mouth 2 (two) times daily. To be taken after surgery to prevent blood clots   atorvastatin (LIPITOR) 40 MG tablet Take 1 tablet by mouth once daily   Blood Glucose Monitoring Suppl (ONE TOUCH ULTRA 2) w/Device KIT USE TO CHECK GLUCOSE THREE TIMES DAILY   cefadroxil (DURICEF) 500 MG capsule Take 1 capsule (500 mg total) by mouth 2 (two) times daily. To be taken after surgery   dapagliflozin propanediol (FARXIGA) 10 MG TABS tablet Take 1 tablet (10 mg total) by mouth daily.   diclofenac (VOLTAREN) 75 MG EC tablet Take 1 tablet (75 mg total) by mouth 2 (two) times daily as needed.   diclofenac Sodium (VOLTAREN) 1 % GEL Apply 2 g topically daily as needed (pain).   docusate sodium (COLACE) 100 MG capsule Take 1 capsule (100 mg total) by mouth daily as needed.   glucose blood test strip 1 each by Other route daily. Use as instructed   Insulin Pen Needle (BD PEN NEEDLE MICRO U/F) 32G X 6 MM MISC USE AS DIRECTED ONCE DAILY   metFORMIN (GLUCOPHAGE) 1000 MG tablet Take 1 tablet by mouth twice daily   methocarbamol (ROBAXIN-750) 750 MG tablet Take 1 tablet (750 mg total) by mouth 2 (two) times daily as needed for muscle spasms.   ondansetron (ZOFRAN) 4 MG tablet Take 1 tablet (4 mg total) by mouth every 8 (eight) hours as needed for nausea or vomiting.  oxyCODONE-acetaminophen (PERCOCET) 5-325 MG tablet Take 1 tablet by mouth every 8 (eight) hours as needed.   tamsulosin (FLOMAX) 0.4 MG CAPS capsule Take 1 capsule (0.4 mg total) by mouth daily.   TRESIBA FLEXTOUCH 100 UNIT/ML FlexTouch Pen Inject 30 units subq daily   No facility-administered encounter medications on file as of 05/27/2022.    Recent Relevant Labs: Lab Results  Component Value Date/Time   HGBA1C 6.9 (H) 04/01/2022 11:53 AM   HGBA1C 6.8 (H) 03/10/2022 10:45 AM   MICROALBUR 80  05/17/2019 09:03 AM    Kidney Function Lab Results  Component Value Date/Time   CREATININE 0.77 04/01/2022 11:53 AM   CREATININE 0.77 03/10/2022 10:45 AM   GFRNONAA >60 04/01/2022 11:53 AM   GFRAA 109 02/15/2020 10:01 AM    Star Rating Drugs:  Atorvastatin 40 mg- Last filled 04-06-2022 90 DS. Previous 01-06-2022 90 DS Farxiga 10 mg- Last filled 05-04-2022 30 DS. Previous 04-06-2022 30 DS Lotrel 10-20 mg- Last filled 05-13-2022 90 DS . Previous 02-10-2022 90 DS Metformin 1000 mg- Last filled 03-02-2022 90 DS. Previous 11-30-2021 90 DS.  Care Gaps: Annual wellness visit in last year? Yes Last eye exam / retinopathy screening:Patient stated 1.5 years ago  Last diabetic foot exam: Tdap overdue Shingrix overdue Eye exam overdue Uacr overdue  [facility] Clinical Pharmacist Assistant [phone]

## 2022-06-03 ENCOUNTER — Other Ambulatory Visit: Payer: Self-pay | Admitting: Family Medicine

## 2022-06-03 DIAGNOSIS — F419 Anxiety disorder, unspecified: Secondary | ICD-10-CM

## 2022-06-07 ENCOUNTER — Other Ambulatory Visit: Payer: Self-pay | Admitting: Family Medicine

## 2022-06-07 DIAGNOSIS — E1142 Type 2 diabetes mellitus with diabetic polyneuropathy: Secondary | ICD-10-CM

## 2022-06-10 ENCOUNTER — Encounter: Payer: Self-pay | Admitting: Physician Assistant

## 2022-06-10 ENCOUNTER — Ambulatory Visit (INDEPENDENT_AMBULATORY_CARE_PROVIDER_SITE_OTHER): Payer: Medicare HMO | Admitting: Physician Assistant

## 2022-06-10 VITALS — BP 130/70 | HR 91 | Temp 97.5°F | Resp 14 | Ht 70.0 in | Wt 187.0 lb

## 2022-06-10 DIAGNOSIS — E1142 Type 2 diabetes mellitus with diabetic polyneuropathy: Secondary | ICD-10-CM | POA: Diagnosis not present

## 2022-06-10 DIAGNOSIS — N1831 Chronic kidney disease, stage 3a: Secondary | ICD-10-CM | POA: Diagnosis not present

## 2022-06-10 DIAGNOSIS — I129 Hypertensive chronic kidney disease with stage 1 through stage 4 chronic kidney disease, or unspecified chronic kidney disease: Secondary | ICD-10-CM

## 2022-06-10 DIAGNOSIS — E782 Mixed hyperlipidemia: Secondary | ICD-10-CM | POA: Diagnosis not present

## 2022-06-10 DIAGNOSIS — Z794 Long term (current) use of insulin: Secondary | ICD-10-CM

## 2022-06-10 DIAGNOSIS — E1122 Type 2 diabetes mellitus with diabetic chronic kidney disease: Secondary | ICD-10-CM | POA: Diagnosis not present

## 2022-06-10 DIAGNOSIS — I10 Essential (primary) hypertension: Secondary | ICD-10-CM | POA: Diagnosis not present

## 2022-06-10 NOTE — Assessment & Plan Note (Signed)
Labs drawn today, will adjust medications as needed to prevent progression of CKD

## 2022-06-10 NOTE — Assessment & Plan Note (Signed)
Well controlled.  Continue to work on eating a healthy diet and exercise.  Labs drawn today.   No major side effects reported, and no issues with compliance. The current medical regimen is effective;  continue present plan with Lipitor Farxiga 10mg , Tresiba 30 units, Metformin 1,000

## 2022-06-10 NOTE — Assessment & Plan Note (Signed)
Well controlled.  Continue to work on eating a healthy diet and exercise.  Labs drawn today.   No major side effects reported, and no issues with compliance. The current medical regimen is effective;  continue present plan with Lotrel 10-20mg , Aspirin 81mg ,

## 2022-06-10 NOTE — Assessment & Plan Note (Signed)
Well controlled.  Continue to work on eating a healthy diet and exercise.  Labs drawn today.   No major side effects reported, and no issues with compliance. The current medical regimen is effective;  continue present plan with Lipitor 40mg 

## 2022-06-10 NOTE — Progress Notes (Signed)
Subjective:  Patient ID: Clayton James, male    DOB: 05/13/1950  Age: 72 y.o. MRN: [id number]  Chief Complaint  Patient presents with   Medical Management of Chronic Issues    HPI   Patient is here for a follow up for his DM, hyperlipidemia and osteoarthirits. Patient lives home with his wife, he tries to keep moving.Patient watch his diet, he said he eats a lot of deer meat,  loves vegetables. He tries to stay away from sweet.   Patient is currently covering for a left total knee replacement.  Comes in under his own power with any complaints or issues of the minor swelling was noted on the exam but states that he feels it is a major success.  Osteoarthritis, hands  Discussed with the patient about using the Voltaren gel on his hands to help with the arthritis in his hands. Patient states he had not tried it yet but was feeling more stiff in his hands in the morning that then would get better as the day went on.     Diabetes Mellitus Type II, follow-up   Last seen for diabetes 4 months ago.  Management since then includes Tresiba 30 units daily, metformin 1000 mg twice daily , Farxiga 10 mg Once daily He reports good compliance with treatment. He is not having side effects.   Home blood sugar records: fasting range: 95-100 one time it was 69 PP runs 145-195, average 120-152  Episodes of hypoglycemia Every now and then he states it will be at 67-72 in the mornings. Does not happen often and has days up to 115.   Current insulin regiment: Tresiba 30 units Most Recent Eye Exam: he is overdue, said he will make an appointment   Hypertension, follow-up   He was last seen for hypertension 4 months ago.  BP at that visit is 140/82 Management since that visit includes amlodipine-benazepril 10-20 once daily He reports excellent compliance with treatment. He is not having side effects.  He is exercising. He is adherent to low salt diet.   Outside blood pressures are .  He does  not smoke. Patient said he chews tobacco everyday, 1 pouch is good enough for a month.      Lipid/Cholesterol, follow-up  He was last seen for this 4 months ago.  Management since that visit includes atorvastatin 40 mg OD  He reports excellent compliance with treatment. He is not having side effects.   He is following a Low fat diet. Current exercise: walking     03/10/2022    4:44 PM 03/10/2022   10:01 AM 01/01/2022    1:01 PM 04/03/2021    7:43 AM 06/20/2020    9:07 AM  Depression screen PHQ 2/9  Decreased Interest 0 0 0 0 0  Down, Depressed, Hopeless 0 0 0 0 0  PHQ - 2 Score 0 0 0 0 0        03/10/2022    4:44 PM  Fall Risk   Falls in the past year? 0  Number falls in past yr: 0  Injury with Fall? 0    Patient Care Team: Langley Gauss, [place] as PCP - General (Physician Assistant) Zettie Pho, [facility] (Pharmacist)   Review of Systems  Constitutional:  Negative for chills, fatigue, fever and unexpected weight change.  HENT:  Negative for congestion, ear pain, sinus pain and sore throat.   Respiratory:  Negative for cough and shortness of breath.   Cardiovascular:  Negative  for chest pain and palpitations.  Gastrointestinal:  Negative for abdominal pain, blood in stool, constipation, diarrhea, nausea and vomiting.  Endocrine: Negative for polydipsia.  Genitourinary:  Negative for dysuria.  Musculoskeletal:  Negative for back pain.  Skin:  Negative for rash.  Neurological:  Negative for headaches.    Current Outpatient Medications on File Prior to Visit  Medication Sig Dispense Refill   ALPRAZolam (XANAX) 0.5 MG tablet Take 1 tablet by mouth twice daily as needed 60 tablet 0   amLODipine-benazepril (LOTREL) 10-20 MG capsule Take 1 capsule by mouth daily. 90 capsule 0   aspirin EC 81 MG tablet Take 1 tablet (81 mg total) by mouth 2 (two) times daily. To be taken after surgery to prevent blood clots 84 tablet 0   atorvastatin (LIPITOR) 40 MG tablet Take 1 tablet by  mouth once daily 90 tablet 2   Blood Glucose Monitoring Suppl (ONE TOUCH ULTRA 2) w/Device KIT USE TO CHECK GLUCOSE THREE TIMES DAILY     dapagliflozin propanediol (FARXIGA) 10 MG TABS tablet Take 1 tablet (10 mg total) by mouth daily. 90 tablet 2   diclofenac (VOLTAREN) 75 MG EC tablet Take 1 tablet (75 mg total) by mouth 2 (two) times daily as needed. 60 tablet 2   glucose blood test strip 1 each by Other route daily. Use as instructed 100 each 2   Insulin Pen Needle (BD PEN NEEDLE MICRO U/F) 32G X 6 MM MISC USE AS DIRECTED ONCE DAILY 100 each 4   metFORMIN (GLUCOPHAGE) 1000 MG tablet Take 1 tablet by mouth twice daily 180 tablet 0   tamsulosin (FLOMAX) 0.4 MG CAPS capsule Take 1 capsule (0.4 mg total) by mouth daily. 90 capsule 1   TRESIBA FLEXTOUCH 100 UNIT/ML FlexTouch Pen Inject 30 units subq daily 15 mL 3   No current facility-administered medications on file prior to visit.   Past Medical History:  Diagnosis Date   Arthritis    Benign essential hypertension 04/27/2019   Chronic kidney disease, stage 3a (HCC) 04/27/2019   Family history of adverse reaction to anesthesia    Mothers sister had issues, but pt isn't sure what they were   Mixed hyperlipidemia 04/27/2019   Type 2 diabetes mellitus with diabetic polyneuropathy (HCC) 04/27/2019   Type 2 diabetes mellitus with stage 3 chronic kidney disease (HCC) 04/27/2019   Past Surgical History:  Procedure Laterality Date   COLONOSCOPY     ELBOW SURGERY Left 1994   KNEE ARTHROSCOPY Right 1998   KNEE CARTILAGE SURGERY Left 1994   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   Procedure: RIGHT TOTAL KNEE ARTHROPLASTY;  Surgeon: Tarry Kos, MD;  Location: [facility] OR;  Service: Orthopedics;  Laterality: Right;   TOTAL KNEE ARTHROPLASTY Left 04/06/2022   Procedure: LEFT TOTAL KNEE ARTHROPLASTY;  Surgeon: Tarry Kos, MD;  Location: [facility] OR;  Service: Orthopedics;  Laterality: Left;    Family History  Problem Relation Age of Onset   Diabetes  Mother    Kidney disease Mother    Heart disease Father    Social History   Socioeconomic History   Marital status: Married    Spouse name: Yevonne Pax   Number of children: 3   Years of education: Not on file   Highest education level: Not on file  Occupational History   Not on file  Tobacco Use   Smoking status: Former    Packs/day: 0.50    Years: 8.00    Additional pack years: 0.00  Total pack years: 4.00    Types: Cigarettes    Quit date: 2000    Years since quitting: 24.4   Smokeless tobacco: Current    Types: Chew   Tobacco comments:    1 can per week  Vaping Use   Vaping Use: Never used  Substance and Sexual Activity   Alcohol use: Not Currently    Alcohol/week: 2.0 - 3.0 standard drinks of alcohol    Types: 1 Glasses of wine, 1 - 2 Standard drinks or equivalent per week    Comment: sometimes   Drug use: Never   Sexual activity: Yes    Partners: Female  Other Topics Concern   Not on file  Social History Narrative   Oldest son and youngest son passed away   Social Determinants of Health   Financial Resource Strain: Medium Risk (02/11/2022)   Overall Financial Resource Strain (CARDIA)    Difficulty of Paying Living Expenses: Somewhat hard  Food Insecurity: No Food Insecurity (01/01/2022)   Hunger Vital Sign    Worried About Running Out of Food in the Last Year: Never true    Ran Out of Food in the Last Year: Never true  Transportation Needs: No Transportation Needs (02/11/2022)   PRAPARE - Administrator, Civil Service (Medical): No    Lack of Transportation (Non-Medical): No  Physical Activity: Insufficiently Active (01/01/2022)   Exercise Vital Sign    Days of Exercise per Week: 3 days    Minutes of Exercise per Session: 30 min  Stress: No Stress Concern Present (01/01/2022)   [employer] of Occupational Health - Occupational Stress Questionnaire    Feeling of Stress : Not at all  Social Connections: Not on file    Objective:  BP  (!) 140/82   Pulse 91   Temp (!) 97.5 F (36.4 C)   Resp 14   Ht 5\' 10"  (1.778 m)   Wt 187 lb (84.8 kg)   SpO2 96%   BMI 26.83 kg/m      06/10/2022    9:51 AM 04/07/2022    7:43 AM 04/07/2022    4:13 AM  BP/Weight  Systolic BP 140 139 138  Diastolic BP 82 80 82  Wt. (Lbs) 187    BMI 26.83 kg/m2      Physical Exam Constitutional:      Appearance: Normal appearance.  Eyes:     Conjunctiva/sclera: Conjunctivae normal.  Cardiovascular:     Rate and Rhythm: Normal rate and regular rhythm.     Heart sounds: Normal heart sounds.  Pulmonary:     Effort: Pulmonary effort is normal.     Breath sounds: Normal breath sounds.  Abdominal:     General: Bowel sounds are normal.     Palpations: Abdomen is soft.     Tenderness: There is no abdominal tenderness.  Musculoskeletal:     Right hand: Swelling present. Decreased range of motion.     Left hand: Swelling present. Decreased range of motion.     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   Skin:    Findings: No lesion or rash.  Neurological:     Mental Status: He is alert and oriented to person, place, and time.  Psychiatric:        Behavior: Behavior normal.     Diabetic Foot Exam - Simple   Simple Foot Form Diabetic Foot exam was performed with the following findings: Yes 06/10/2022 10:41 AM  Visual  Inspection No deformities, no ulcerations, no other skin breakdown bilaterally: Yes Sensation Testing Intact to touch and monofilament testing bilaterally: Yes Pulse Check Posterior Tibialis and Dorsalis pulse intact bilaterally: Yes Comments      Lab Results  Component Value Date   WBC 9.4 04/07/2022   HGB 14.0 04/07/2022   HCT 40.7 04/07/2022   PLT 265 04/07/2022   GLUCOSE 101 (H) 04/01/2022   CHOL 158 03/10/2022   TRIG 99 03/10/2022   HDL 65 03/10/2022   LDLCALC 75 03/10/2022   ALT 11 03/10/2022   AST 13 03/10/2022   NA 139 04/01/2022   K 3.7 04/01/2022   CL 107 04/01/2022    CREATININE 0.77 04/01/2022   BUN 12 04/01/2022   CO2 22 04/01/2022   HGBA1C 6.9 (H) 04/01/2022   MICROALBUR 80 05/17/2019      Assessment & Plan:    Benign essential hypertension Assessment & Plan: Well controlled.  Continue to work on eating a healthy diet and exercise.  Labs drawn today.   No major side effects reported, and no issues with compliance. The current medical regimen is effective;  continue present plan with Lotrel 10-20mg , Aspirin 81mg ,   Orders: -     CBC with Differential/Platelet -     Comprehensive metabolic panel  Type 2 diabetes mellitus with diabetic polyneuropathy, with long-term current use of insulin (HCC) Assessment & Plan: Well controlled.  Continue to work on eating a healthy diet and exercise.  Labs drawn today.   No major side effects reported, and no issues with compliance. The current medical regimen is effective;  continue present plan with Lipitor Farxiga 10mg , Tresiba 30 units, Metformin 1,000  Orders: -     Hemoglobin A1c -     Microalbumin / creatinine urine ratio  Chronic kidney disease, stage 3a (HCC) Assessment & Plan: Labs drawn today, will adjust medications as needed to prevent progression of CKD   Mixed hyperlipidemia Assessment & Plan: Well controlled.  Continue to work on eating a healthy diet and exercise.  Labs drawn today.   No major side effects reported, and no issues with compliance. The current medical regimen is effective;  continue present plan with Lipitor 40mg   Orders: -     Lipid panel     No orders of the defined types were placed in this encounter.   Orders Placed This Encounter  Procedures   CBC with Differential/Platelet   Comprehensive metabolic panel   Hemoglobin A1c   Lipid panel   Microalbumin / creatinine urine ratio     Follow-up: Return in about 3 months (around 09/10/2022) for fasting, Chronic, [place]on Foley.   I,Marla I Leal-Borjas,acting as a scribe for [employer], PA.,have  documented all relevant documentation on the behalf of Langley Gauss, PA,as directed by  Langley Gauss, PA while in the presence of Langley Gauss, [place].   An After Visit Summary was printed and given to the patient.  Langley Gauss, [place] [facility] [phone]

## 2022-06-11 LAB — COMPREHENSIVE METABOLIC PANEL
ALT: 19 IU/L (ref 0–44)
AST: 15 IU/L (ref 0–40)
Albumin/Globulin Ratio: 1.7 (ref 1.2–2.2)
Albumin: 4.5 g/dL (ref 3.8–4.8)
Alkaline Phosphatase: 94 IU/L (ref 44–121)
BUN/Creatinine Ratio: 19 (ref 10–24)
BUN: 15 mg/dL (ref 8–27)
Bilirubin Total: 0.7 mg/dL (ref 0.0–1.2)
CO2: 21 mmol/L (ref 20–29)
Calcium: 9.9 mg/dL (ref 8.6–10.2)
Chloride: 103 mmol/L (ref 96–106)
Creatinine, Ser: 0.77 mg/dL (ref 0.76–1.27)
Globulin, Total: 2.7 g/dL (ref 1.5–4.5)
Glucose: 125 mg/dL — ABNORMAL HIGH (ref 70–99)
Potassium: 4.3 mmol/L (ref 3.5–5.2)
Sodium: 141 mmol/L (ref 134–144)
Total Protein: 7.2 g/dL (ref 6.0–8.5)
eGFR: 95 mL/min/{1.73_m2} (ref >59)

## 2022-06-11 LAB — MICROALBUMIN / CREATININE URINE RATIO
Creatinine, Urine: 63.5 mg/dL
Microalb/Creat Ratio: 42 mg/g creat — ABNORMAL HIGH (ref 0–29)
Microalbumin, Urine: 26.5 ug/mL

## 2022-06-11 LAB — CBC WITH DIFFERENTIAL/PLATELET
Basophils Absolute: 0.1 10*3/uL (ref 0.0–0.2)
Basos: 1 %
EOS (ABSOLUTE): 0.2 10*3/uL (ref 0.0–0.4)
Eos: 3 %
Hematocrit: 46.1 % (ref 37.5–51.0)
Hemoglobin: 15.4 g/dL (ref 13.0–17.7)
Immature Grans (Abs): 0 10*3/uL (ref 0.0–0.1)
Immature Granulocytes: 1 %
Lymphocytes Absolute: 1.6 10*3/uL (ref 0.7–3.1)
Lymphs: 25 %
MCH: 30.3 pg (ref 26.6–33.0)
MCHC: 33.4 g/dL (ref 31.5–35.7)
MCV: 91 fL (ref 79–97)
Monocytes Absolute: 0.5 10*3/uL (ref 0.1–0.9)
Monocytes: 8 %
Neutrophils Absolute: 4.1 10*3/uL (ref 1.4–7.0)
Neutrophils: 62 %
Platelets: 322 10*3/uL (ref 150–450)
RBC: 5.08 x10E6/uL (ref 4.14–5.80)
RDW: 13.4 % (ref 11.6–15.4)
WBC: 6.5 10*3/uL (ref 3.4–10.8)

## 2022-06-11 LAB — LIPID PANEL
Chol/HDL Ratio: 3.4 ratio (ref 0.0–5.0)
Cholesterol, Total: 181 mg/dL (ref 100–199)
HDL: 54 mg/dL (ref >39)
LDL Chol Calc (NIH): 105 mg/dL — ABNORMAL HIGH (ref 0–99)
Triglycerides: 126 mg/dL (ref 0–149)
VLDL Cholesterol Cal: 22 mg/dL (ref 5–40)

## 2022-06-11 LAB — HEMOGLOBIN A1C
Est. average glucose Bld gHb Est-mCnc: 143 mg/dL
Hgb A1c MFr Bld: 6.6 % — ABNORMAL HIGH (ref 4.8–5.6)

## 2022-06-11 LAB — CARDIOVASCULAR RISK ASSESSMENT

## 2022-06-26 DIAGNOSIS — E1142 Type 2 diabetes mellitus with diabetic polyneuropathy: Secondary | ICD-10-CM | POA: Diagnosis not present

## 2022-06-26 DIAGNOSIS — E1121 Type 2 diabetes mellitus with diabetic nephropathy: Secondary | ICD-10-CM | POA: Diagnosis not present

## 2022-07-02 ENCOUNTER — Other Ambulatory Visit: Payer: Self-pay | Admitting: Family Medicine

## 2022-07-02 DIAGNOSIS — F419 Anxiety disorder, unspecified: Secondary | ICD-10-CM

## 2022-07-08 ENCOUNTER — Ambulatory Visit: Payer: Medicare HMO | Admitting: Orthopaedic Surgery

## 2022-07-08 ENCOUNTER — Telehealth: Payer: Self-pay | Admitting: *Deleted

## 2022-07-08 DIAGNOSIS — Z96652 Presence of left artificial knee joint: Secondary | ICD-10-CM

## 2022-07-08 NOTE — Progress Notes (Signed)
Post-Op Visit Note   Patient: Clayton James           Date of Birth: Jul 23, 1950           MRN: [id number] Visit Date: 07/08/2022 PCP: Langley Gauss, PA   Assessment & Plan:  Chief Complaint:  Chief Complaint  Patient presents with   Left Knee - Follow-up    Left total knee arthroplasty 04/06/2022   Visit Diagnoses:  1. Status post total left knee replacement     Plan: All patient is now 3 months status post left total knee replacement.  He is doing well overall.  He continues to do his home exercises.  He has no complaints.  Examination left knee shows fully healed surgical scar.  Excellent range of motion.  Normal gait pattern.  Dental prophylaxis reinforced.  Activity restrictions and modifications as needed.  Advance activity as tolerated.  Follow-up in 3 months with two-view x-rays of bilateral knees.  Follow-Up Instructions: Return in about 3 months (around 10/08/2022).   Orders:  No orders of the defined types were placed in this encounter.  No orders of the defined types were placed in this encounter.   Imaging: No results found.  PMFS History: Patient Active Problem List   Diagnosis Date Noted   Status post total left knee replacement 04/06/2022   Primary osteoarthritis of right knee 05/26/2021   Status post total right knee replacement 05/26/2021   BPH (benign prostatic hyperplasia) 03/20/2021   Rotator cuff tear, right 12/02/2020   Pigmented nevus 04/18/2020   BMI 25.0-25.9,adult 10/04/2019   PVD (peripheral vascular disease) (HCC) 08/28/2019   Primary osteoarthritis of left knee 05/04/2019   Mixed hyperlipidemia 04/27/2019   Chronic kidney disease, stage 3a (HCC) 04/27/2019   Type 2 diabetes mellitus with diabetic polyneuropathy (HCC) 04/27/2019   Type 2 diabetes mellitus with stage 3 chronic kidney disease (HCC) 04/27/2019   Benign essential hypertension 04/27/2019   Osteoarthritis of right knee 04/27/2019   Past Medical History:  Diagnosis Date    Arthritis    Benign essential hypertension 04/27/2019   Chronic kidney disease, stage 3a (HCC) 04/27/2019   Family history of adverse reaction to anesthesia    Mothers sister had issues, but pt isn't sure what they were   Mixed hyperlipidemia 04/27/2019   Type 2 diabetes mellitus with diabetic polyneuropathy (HCC) 04/27/2019   Type 2 diabetes mellitus with stage 3 chronic kidney disease (HCC) 04/27/2019    Family History  Problem Relation Age of Onset   Diabetes Mother    Kidney disease Mother    Heart disease Father     Past Surgical History:  Procedure Laterality Date   COLONOSCOPY     ELBOW SURGERY Left 1994   KNEE ARTHROSCOPY Right 1998   KNEE CARTILAGE SURGERY Left 1994   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   Procedure: RIGHT TOTAL KNEE ARTHROPLASTY;  Surgeon: Tarry Kos, MD;  Location: [facility] OR;  Service: Orthopedics;  Laterality: Right;   TOTAL KNEE ARTHROPLASTY Left 04/06/2022   Procedure: LEFT TOTAL KNEE ARTHROPLASTY;  Surgeon: Tarry Kos, MD;  Location: [facility] OR;  Service: Orthopedics;  Laterality: Left;   Social History   Occupational History   Not on file  Tobacco Use   Smoking status: Former    Packs/day: 0.50    Years: 8.00    Additional pack years: 0.00    Total pack years: 4.00    Types: Cigarettes    Quit date: 2000  Years since quitting: 24.4   Smokeless tobacco: Current    Types: Chew   Tobacco comments:    1 can per week  Vaping Use   Vaping Use: Never used  Substance and Sexual Activity   Alcohol use: Not Currently    Alcohol/week: 2.0 - 3.0 standard drinks of alcohol    Types: 1 Glasses of wine, 1 - 2 Standard drinks or equivalent per week    Comment: sometimes   Drug use: Never   Sexual activity: Yes    Partners: Female

## 2022-07-08 NOTE — Telephone Encounter (Signed)
Ortho bundle 90 day in office visit completed. 

## 2022-07-22 ENCOUNTER — Other Ambulatory Visit: Payer: Self-pay

## 2022-07-22 NOTE — Telephone Encounter (Signed)
Patient called and stated he will be on vacation next week and he will due for a refill for xanax while he is out of town and wanted to know can he go ahead and get a refill now.  Recommend call and get refill sent to a pharmacy near him while he is on vacation, due to medication being controled it can not be called in a week early but the provider can send it to a different pharmacy while he is out of town.

## 2022-07-30 ENCOUNTER — Telehealth: Payer: Self-pay

## 2022-07-30 ENCOUNTER — Other Ambulatory Visit: Payer: Self-pay | Admitting: Physician Assistant

## 2022-07-30 DIAGNOSIS — F419 Anxiety disorder, unspecified: Secondary | ICD-10-CM

## 2022-07-30 MED ORDER — ALPRAZOLAM 0.5 MG PO TABS: 0.5000 mg | ORAL_TABLET | Freq: Two times a day (BID) | ORAL | 0 refills | Status: DC | PRN

## 2022-07-30 NOTE — Telephone Encounter (Signed)
Patient was told to call back and give [place] the information of which pharmacy to send his Alprazolam rx refill to. He states he is on vacation and will be in [place] for a few weeks. I have put the pharmacy in patient's chart it is [facility] in [place], [place].

## 2022-07-31 NOTE — Telephone Encounter (Signed)
Left detailed message informing patient

## 2022-08-17 ENCOUNTER — Other Ambulatory Visit: Payer: Self-pay | Admitting: Family Medicine

## 2022-08-17 DIAGNOSIS — I10 Essential (primary) hypertension: Secondary | ICD-10-CM

## 2022-08-21 ENCOUNTER — Other Ambulatory Visit: Payer: Self-pay | Admitting: Physician Assistant

## 2022-08-27 ENCOUNTER — Other Ambulatory Visit: Payer: Self-pay

## 2022-08-27 DIAGNOSIS — F419 Anxiety disorder, unspecified: Secondary | ICD-10-CM

## 2022-08-27 MED ORDER — ALPRAZOLAM 0.5 MG PO TABS
0.5000 mg | ORAL_TABLET | Freq: Two times a day (BID) | ORAL | 0 refills | Status: DC | PRN
Start: 2022-08-27 — End: 2022-09-25

## 2022-08-27 NOTE — Telephone Encounter (Signed)
Patient called requesting refill of Xanax be sent to [facility].  Last refilled 07/30/22

## 2022-09-03 IMAGING — DX DG KNEE 1-2V PORT*R*
2 series · 2 of 2 positions shown · non-contrast
Comparison: 03/24/2021

CLINICAL DATA: Status post right knee arthroplasty

EXAM:
PORTABLE RIGHT KNEE - 1-2 VIEW

[knee ap]
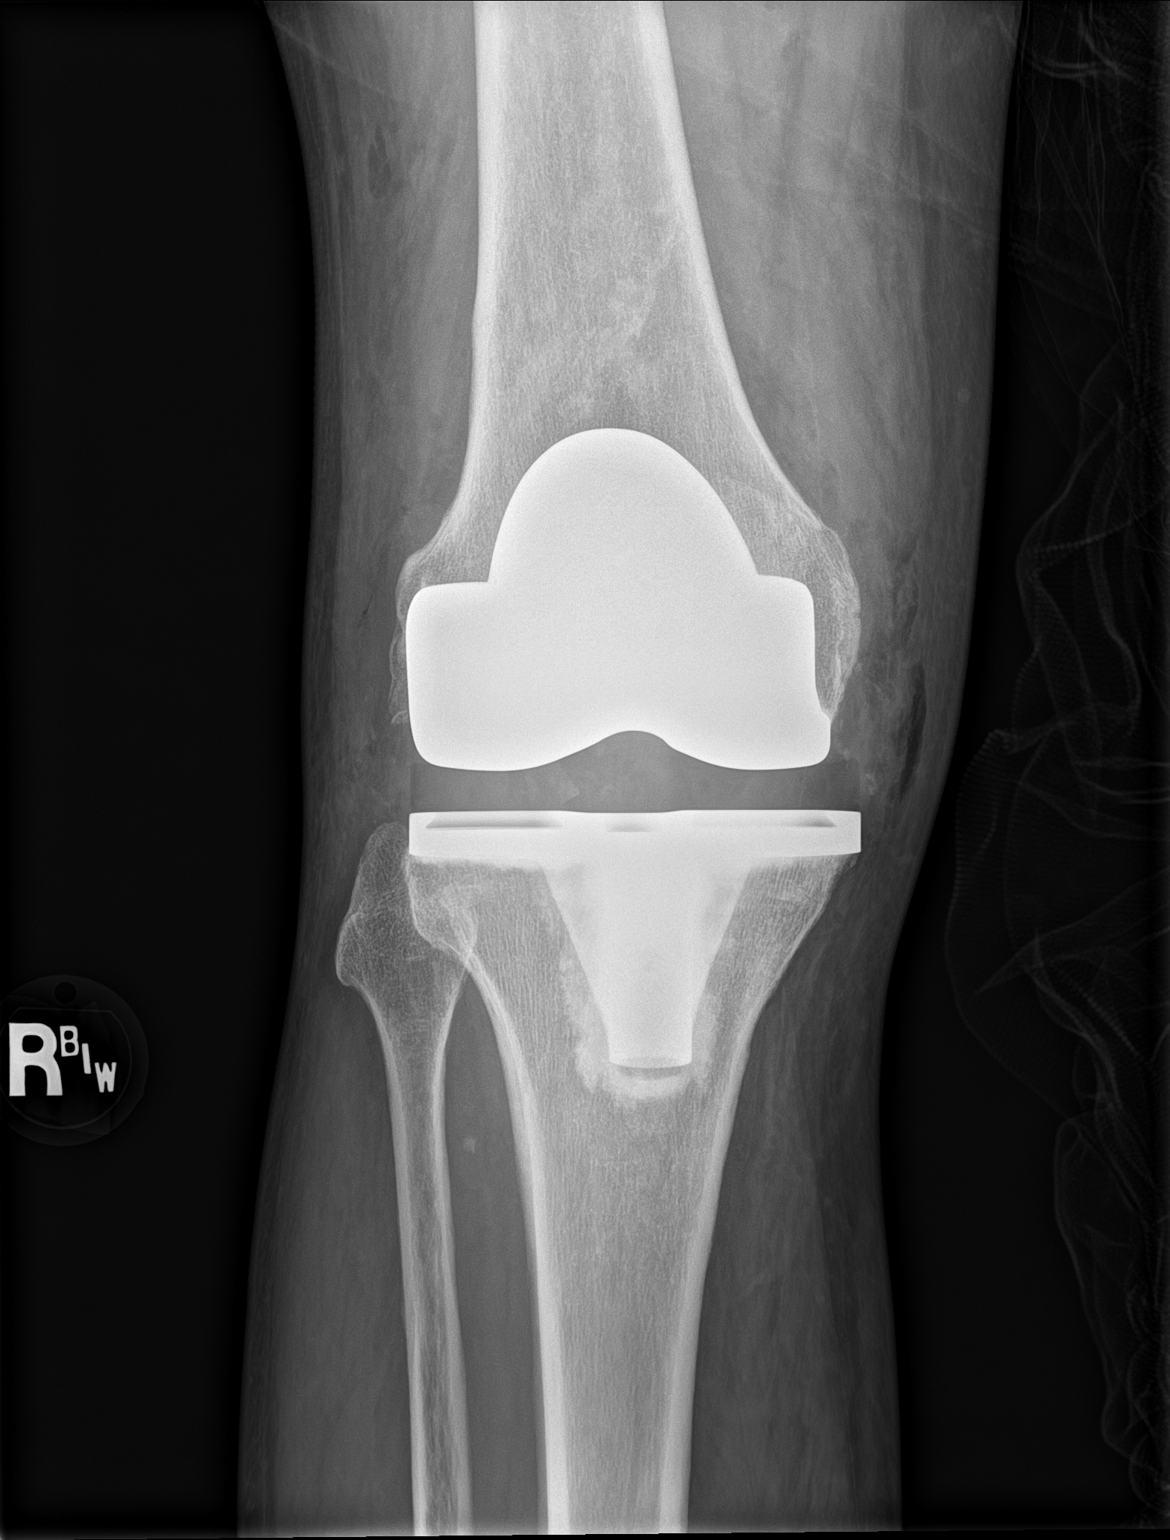

[knee lat]
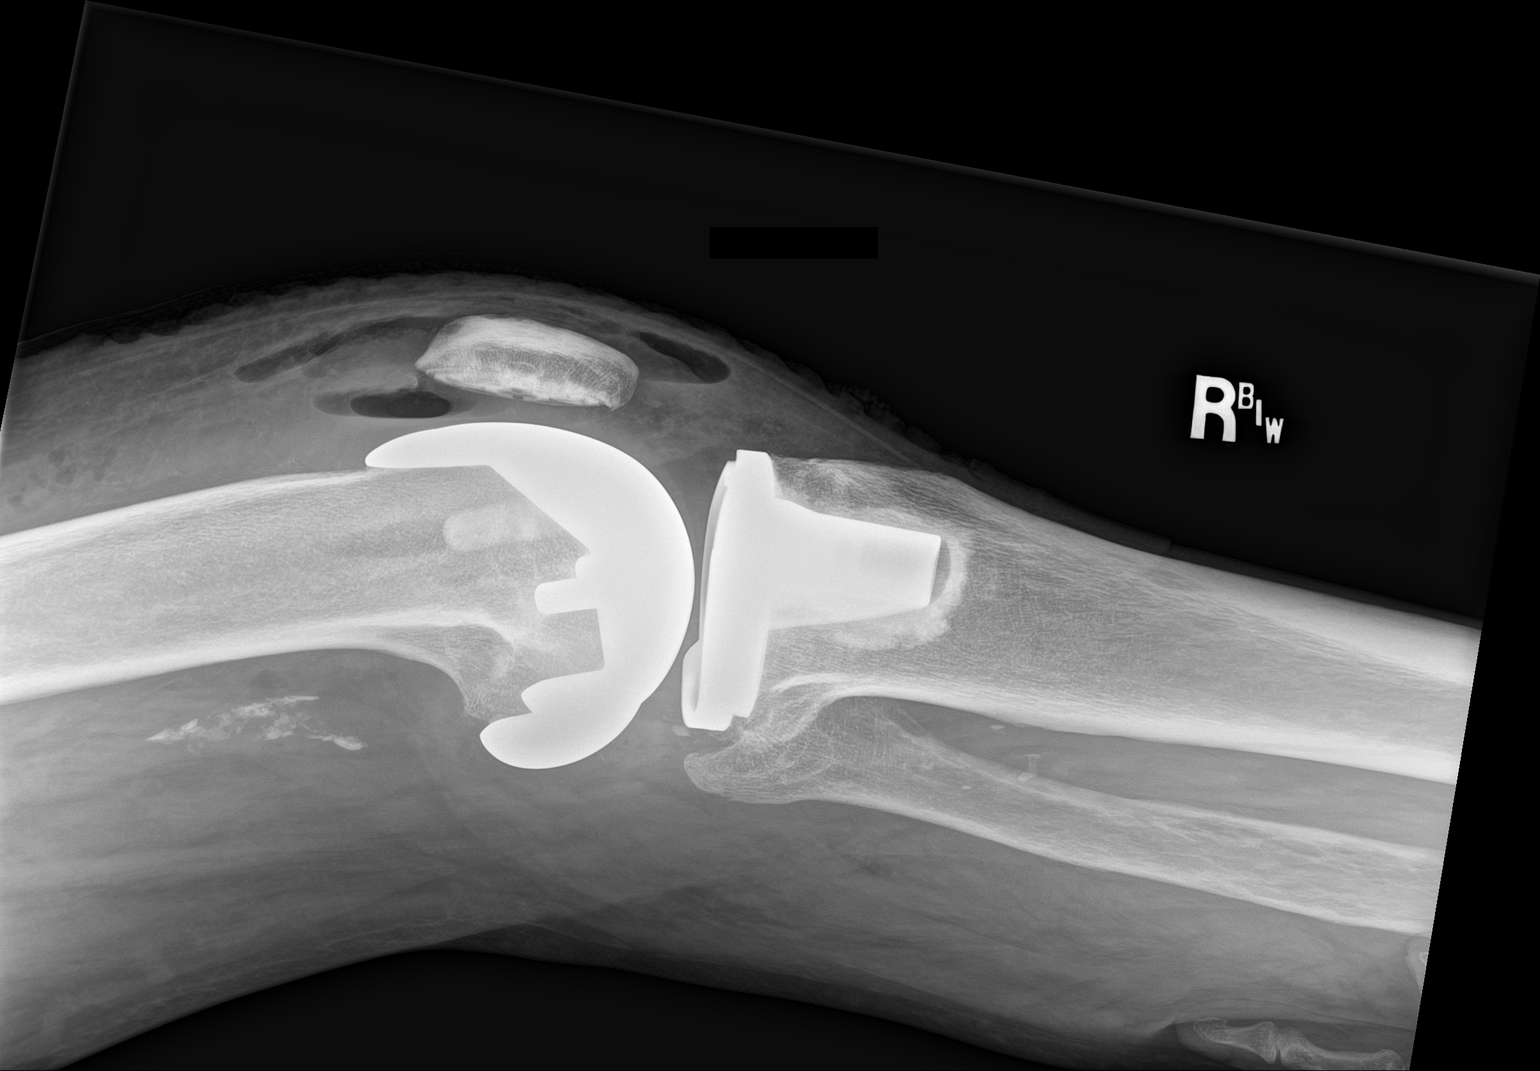

[2 of 2 positions shown; findings below may reference images not displayed]

FINDINGS: There is interval right knee arthroplasty. There are pockets of air
in the soft tissues. Arterial calcifications are seen in the soft
tissues.
IMPRESSION: Status post right knee arthroplasty.

## 2022-09-14 ENCOUNTER — Other Ambulatory Visit: Payer: Self-pay | Admitting: Family Medicine

## 2022-09-14 DIAGNOSIS — E1142 Type 2 diabetes mellitus with diabetic polyneuropathy: Secondary | ICD-10-CM

## 2022-09-15 NOTE — Progress Notes (Addendum)
Subjective:  Patient ID: Clayton James, male    DOB: May 24, 1950  Age: 72 y.o. MRN: [id number]  Chief Complaint  Patient presents with   Medical Management of Chronic Issues    HPI   Patient is here for a follow up for his DM, hyperlipidemia and osteoarthirits.  Admits to continued pain in bilateral shoulders with his right shoulder being worse than his left.  States that he is also having pain in bilateral wrists and hands.  Admits to now his right elbow also beginning to give him pain.  States that he has been taking 4 aspirin 3 times a day.  This is only helped to lessen the pain but it is still causing him pain throughout the rest of the day.  Admits to having decreased function due to the pain.  Patient also admits to a new large bump appearing within the past few weeks on his right wrist.  States that it does not cause him pain or issues with movement.  Denies ever having anything like this before.  Denies any fevers chills Restart tramodol 50mg  Right shoulder injection  Osteoarthritis, hands Using Voltaren gel.  Diabetes Mellitus Type II, follow-up  Last seen for diabetes 3 months ago.  Management since then includes Tresiba 30 units daily, metformin 1000 mg twice daily , Farxiga 10 mg Once daily He reports good compliance with treatment. He is not having side effects.   Home blood sugar records: fasting range: 63-245  Current insulin regiment: Tresiba 30 units Most Recent Eye Exam: he is overdue, said he will make an appointment   Hypertension, follow-up  He was last seen for hypertension 4 months ago.  BP at that visit is  Management since that visit includes amlodipine-benazepril 10-20 once daily He reports excellent compliance with treatment. He is not having side effects.  He is exercising. He is adherent to low salt diet.   Outside blood pressures are .  He does not smoke. Patient said he chews tobacco everyday, 1 pouch is good enough for a month.     Lipid/Cholesterol, follow-up  He was last seen for this 4 months ago.  Management since that visit includes atorvastatin 40 mg OD  He reports excellent compliance with treatment. He is not having side effects.   He is following a Low fat diet. Current exercise: walking     09/16/2022    7:29 AM 03/10/2022    4:44 PM 03/10/2022   10:01 AM 01/01/2022    1:01 PM 04/03/2021    7:43 AM  Depression screen PHQ 2/9  Decreased Interest 0 0 0 0 0  Down, Depressed, Hopeless 0 0 0 0 0  PHQ - 2 Score 0 0 0 0 0        09/16/2022    7:29 AM  Fall Risk   Falls in the past year? 0  Number falls in past yr: 0  Injury with Fall? 0  Risk for fall due to : No Fall Risks  Follow up Falls evaluation completed    Patient Care Team: Langley Gauss, [place] as PCP - General (Physician Assistant) Zettie Pho, [facility] (Inactive) (Pharmacist)   Review of Systems  Constitutional:  Negative for chills, diaphoresis, fatigue and fever.  HENT:  Negative for congestion, ear pain and sore throat.   Respiratory:  Negative for cough and shortness of breath.   Cardiovascular:  Negative for chest pain and leg swelling.  Gastrointestinal:  Negative for abdominal pain, constipation, diarrhea, nausea and  vomiting.  Genitourinary:  Negative for dysuria and urgency.  Musculoskeletal:  Positive for arthralgias (BL shoulder, hands and fingers). Negative for myalgias.  Neurological:  Negative for dizziness and headaches.  Psychiatric/Behavioral:  Negative for dysphoric mood.     Current Outpatient Medications on File Prior to Visit  Medication Sig Dispense Refill   ALPRAZolam (XANAX) 0.5 MG tablet Take 1 tablet (0.5 mg total) by mouth 2 (two) times daily as needed. 60 tablet 0   amLODipine-benazepril (LOTREL) 10-20 MG capsule Take 1 capsule by mouth once daily 90 capsule 0   aspirin EC 81 MG tablet Take 1 tablet (81 mg total) by mouth 2 (two) times daily. To be taken after surgery to prevent blood clots 84 tablet  0   atorvastatin (LIPITOR) 40 MG tablet Take 1 tablet by mouth once daily 90 tablet 2   Blood Glucose Monitoring Suppl (ONE TOUCH ULTRA 2) w/Device KIT USE TO CHECK GLUCOSE THREE TIMES DAILY     dapagliflozin propanediol (FARXIGA) 10 MG TABS tablet Take 1 tablet (10 mg total) by mouth daily. 90 tablet 2   glucose blood test strip 1 each by Other route daily. Use as instructed 100 each 2   Insulin Pen Needle (BD PEN NEEDLE MICRO U/F) 32G X 6 MM MISC USE AS DIRECTED ONCE DAILY 100 each 4   metFORMIN (GLUCOPHAGE) 1000 MG tablet Take 1 tablet by mouth twice daily 180 tablet 0   tamsulosin (FLOMAX) 0.4 MG CAPS capsule Take 1 capsule (0.4 mg total) by mouth daily. 90 capsule 1   TRESIBA FLEXTOUCH 100 UNIT/ML FlexTouch Pen Inject 30 units subq daily 15 mL 3   No current facility-administered medications on file prior to visit.   Past Medical History:  Diagnosis Date   Arthritis    Benign essential hypertension 04/27/2019   Chronic kidney disease, stage 3a (HCC) 04/27/2019   Family history of adverse reaction to anesthesia    Mothers sister had issues, but pt isn't sure what they were   Mixed hyperlipidemia 04/27/2019   Type 2 diabetes mellitus with diabetic polyneuropathy (HCC) 04/27/2019   Type 2 diabetes mellitus with stage 3 chronic kidney disease (HCC) 04/27/2019   Past Surgical History:  Procedure Laterality Date   COLONOSCOPY     ELBOW SURGERY Left 1994   KNEE ARTHROSCOPY Right 1998   KNEE CARTILAGE SURGERY Left 1994   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   Procedure: RIGHT TOTAL KNEE ARTHROPLASTY;  Surgeon: Tarry Kos, MD;  Location: [facility] OR;  Service: Orthopedics;  Laterality: Right;   TOTAL KNEE ARTHROPLASTY Left 04/06/2022   Procedure: LEFT TOTAL KNEE ARTHROPLASTY;  Surgeon: Tarry Kos, MD;  Location: [facility] OR;  Service: Orthopedics;  Laterality: Left;    Family History  Problem Relation Age of Onset   Diabetes Mother    Kidney disease Mother    Heart disease Father     Social History   Socioeconomic History   Marital status: Married    Spouse name: Technical sales engineer   Number of children: 3   Years of education: Not on file   Highest education level: Not on file  Occupational History   Not on file  Tobacco Use   Smoking status: Former    Current packs/day: 0.00    Average packs/day: 0.5 packs/day for 8.0 years (4.0 ttl pk-yrs)    Types: Cigarettes    Start date: 68    Quit date: 2000    Years since quitting: 24.6   Smokeless tobacco: Current  Types: Chew   Tobacco comments:    1 can per week  Vaping Use   Vaping status: Never Used  Substance and Sexual Activity   Alcohol use: Not Currently    Alcohol/week: 2.0 - 3.0 standard drinks of alcohol    Types: 1 Glasses of wine, 1 - 2 Standard drinks or equivalent per week    Comment: sometimes   Drug use: Never   Sexual activity: Yes    Partners: Female  Other Topics Concern   Not on file  Social History Narrative   Oldest son and youngest son passed away   Social Determinants of Health   Financial Resource Strain: Medium Risk (02/11/2022)   Overall Financial Resource Strain (CARDIA)    Difficulty of Paying Living Expenses: Somewhat hard  Food Insecurity: No Food Insecurity (01/01/2022)   Hunger Vital Sign    Worried About Running Out of Food in the Last Year: Never true    Ran Out of Food in the Last Year: Never true  Transportation Needs: No Transportation Needs (02/11/2022)   PRAPARE - Administrator, Civil Service (Medical): No    Lack of Transportation (Non-Medical): No  Physical Activity: Insufficiently Active (01/01/2022)   Exercise Vital Sign    Days of Exercise per Week: 3 days    Minutes of Exercise per Session: 30 min  Stress: No Stress Concern Present (01/01/2022)   [employer] of Occupational Health - Occupational Stress Questionnaire    Feeling of Stress : Not at all  Social Connections: Moderately Isolated (09/16/2022)   Social Connection and Isolation  Panel [NHANES]    Frequency of Communication with Friends and Family: More than three times a week    Frequency of Social Gatherings with Friends and Family: More than three times a week    Attends Religious Services: Never    Database administrator or Organizations: No    Attends Engineer, structural: Not on file    Marital Status: Married    Objective:  BP 136/78   Pulse 69   Temp (!) 97.5 F (36.4 C)   Ht 5\' 10"  (1.778 m)   Wt 189 lb (85.7 kg)   SpO2 98%   BMI 27.12 kg/m      09/16/2022    7:26 AM 06/10/2022   11:58 AM 06/10/2022    9:51 AM  BP/Weight  Systolic BP 136 130 140  Diastolic BP 78 70 82  Wt. (Lbs) 189  187  BMI 27.12 kg/m2  26.83 kg/m2    Physical Exam Vitals reviewed.  Constitutional:      Appearance: Normal appearance.  Cardiovascular:     Rate and Rhythm: Normal rate and regular rhythm.     Heart sounds: Normal heart sounds.  Pulmonary:     Effort: Pulmonary effort is normal.     Breath sounds: Normal breath sounds.  Abdominal:     General: Bowel sounds are normal.     Palpations: Abdomen is soft.     Tenderness: There is no abdominal tenderness.  Musculoskeletal:     Right shoulder: No tenderness. Decreased range of motion. Decreased strength.     Left shoulder: No tenderness. Decreased range of motion. Decreased strength.     Right wrist: Swelling, effusion and crepitus present.     Left wrist: Normal.     Right hand: Deformity present. Decreased range of motion. Decreased strength.     Left hand: Normal.     Comments: Ganglion cyst present  on right dorsal wrist.  Pinky on right hand stuck in flexion at PCP joint.   Neurological:     Mental Status: He is alert and oriented to person, place, and time.  Psychiatric:        Mood and Affect: Mood normal.        Behavior: Behavior normal.   Joint Injection/Arthrocentesis  Date/Time: 09/16/2022 8:15 AM  Performed by: Langley Gauss, PA Authorized by: Langley Gauss, PA  Indications:  pain  Body area: shoulder Joint: right shoulder Local anesthesia used: yes  Anesthesia: Local anesthesia used: yes Local anesthetic: Ethylchloride.  Sedation: Patient sedated: no  Needle gauge: 23 G. Ultrasound guidance: no Approach: posterior Aspirate: clear Aspirate amount: 1 mL Triamcinolone amount: 40 mg Lidocaine 1% amount: 5 mL Patient tolerance: patient tolerated the procedure well with no immediate complications      Diabetic Foot Exam - Simple   No data filed      Lab Results  Component Value Date   WBC 6.2 09/16/2022   HGB 16.0 09/16/2022   HCT 47.1 09/16/2022   PLT 364 09/16/2022   GLUCOSE 80 09/16/2022   CHOL 171 09/16/2022   TRIG 87 09/16/2022   HDL 72 09/16/2022   LDLCALC 83 09/16/2022   ALT 16 09/16/2022   AST 14 09/16/2022   NA 144 09/16/2022   K 4.7 09/16/2022   CL 105 09/16/2022   CREATININE 0.87 09/16/2022   BUN 17 09/16/2022   CO2 22 09/16/2022   HGBA1C 6.6 (H) 09/16/2022   MICROALBUR 80 05/17/2019      Assessment & Plan:    Benign essential hypertension Assessment & Plan: Well controlled.  Continue to work on eating a healthy diet and exercise.  Labs drawn today.   No major side effects reported, and no issues with compliance. The current medical regimen is effective;  continue present plan with Amlodipine-Benzapril 10-20mg , Aspirin 81mg ,  Will adjust medication as needed depending on labs BP Readings from Last 3 Encounters:  09/16/22 136/78  06/10/22 130/70  04/07/22 139/80     Orders: -     CBC with Differential/Platelet -     Comprehensive metabolic panel  Type 2 diabetes mellitus with diabetic polyneuropathy, with long-term current use of insulin (HCC) Assessment & Plan: Well controlled.  Continue to work on eating a healthy diet and exercise.  Labs drawn today.   No major side effects reported, and no issues with compliance. The current medical regimen is effective;  continue present plan with Farxiga 10mg ,  Tresiba 100 units, Metformin 1000mg . Will adjust medication as needed depending on labs Patient is on Dexcom, will bring monitor next time to add him to our patient list Lab Results  Component Value Date   HGBA1C 6.6 (H) 06/10/2022   HGBA1C 6.9 (H) 04/01/2022   HGBA1C 6.8 (H) 03/10/2022      Orders: -     Hemoglobin A1c -     Vitamin B12  Benign prostatic hyperplasia with urinary frequency Assessment & Plan: Controlled Denies any issues or side effects with his medicine Continue taking Flomax .4mg  as directed Will adjust medicine as needed   Mixed hyperlipidemia Assessment & Plan: Well controlled.  Continue to work on eating a healthy diet and exercise.  Labs drawn today.   No major side effects reported, and no issues with compliance. The current medical regimen is effective;  continue present plan with Lipitor 40mg  Will adjust medication as needed depending on labs Lab Results  Component Value Date  LDLCALC 105 (H) 06/10/2022     Orders: -     Lipid panel  Primary osteoarthritis of right shoulder Assessment & Plan: Primarily in his bilateral shoulders with right being the worst Does not want to do surgery Discussed doing an action along with PT. Patient is open to injections and then also trying new oral medication Restarted tramadol 50 mg as patient said that was very helpful before.   Discontinued Voltaren 75 mg  Orders: -     traMADol HCl; TAKE 1 TABLET BY MOUTH EVERY 8 HOURS AS NEEDED FOR MODERATE PAIN  Dispense: 30 tablet; Refill: 3 -     Triamcinolone Acetonide -     Arthrocentesis     Meds ordered this encounter  Medications   traMADol (ULTRAM) 50 MG tablet    Sig: TAKE 1 TABLET BY MOUTH EVERY 8 HOURS AS NEEDED FOR MODERATE PAIN    Dispense:  30 tablet    Refill:  3   triamcinolone acetonide (KENALOG-40) injection 40 mg    Orders Placed This Encounter  Procedures   Joint Injection/Arthrocentesis   CBC with Differential/Platelet    Comprehensive metabolic panel   Hemoglobin A1c   Lipid panel   Vitamin B12     Follow-up: No follow-ups on file.   I,Marla I Leal-Borjas,acting as a scribe for [employer], PA.,have documented all relevant documentation on the behalf of Langley Gauss, PA,as directed by  Langley Gauss, PA while in the presence of Langley Gauss, [place].   An After Visit Summary was printed and given to the patient.  Langley Gauss, [place] [facility] [phone]

## 2022-09-16 ENCOUNTER — Encounter: Payer: Self-pay | Admitting: Physician Assistant

## 2022-09-16 ENCOUNTER — Ambulatory Visit: Payer: Medicare HMO | Admitting: Physician Assistant

## 2022-09-16 ENCOUNTER — Ambulatory Visit (INDEPENDENT_AMBULATORY_CARE_PROVIDER_SITE_OTHER): Payer: Medicare HMO | Admitting: Physician Assistant

## 2022-09-16 VITALS — BP 136/78 | HR 69 | Temp 97.5°F | Ht 70.0 in | Wt 189.0 lb

## 2022-09-16 DIAGNOSIS — Z794 Long term (current) use of insulin: Secondary | ICD-10-CM | POA: Diagnosis not present

## 2022-09-16 DIAGNOSIS — E119 Type 2 diabetes mellitus without complications: Secondary | ICD-10-CM | POA: Diagnosis not present

## 2022-09-16 DIAGNOSIS — M19011 Primary osteoarthritis, right shoulder: Secondary | ICD-10-CM | POA: Diagnosis not present

## 2022-09-16 DIAGNOSIS — R350 Frequency of micturition: Secondary | ICD-10-CM

## 2022-09-16 DIAGNOSIS — I10 Essential (primary) hypertension: Secondary | ICD-10-CM | POA: Diagnosis not present

## 2022-09-16 DIAGNOSIS — E1142 Type 2 diabetes mellitus with diabetic polyneuropathy: Secondary | ICD-10-CM

## 2022-09-16 DIAGNOSIS — E782 Mixed hyperlipidemia: Secondary | ICD-10-CM

## 2022-09-16 DIAGNOSIS — N401 Enlarged prostate with lower urinary tract symptoms: Secondary | ICD-10-CM

## 2022-09-16 MED ORDER — TRAMADOL HCL 50 MG PO TABS
ORAL_TABLET | ORAL | 3 refills | Status: DC
Start: 2022-09-16 — End: 2022-10-26

## 2022-09-16 MED ORDER — TRIAMCINOLONE ACETONIDE 40 MG/ML IJ SUSP
40.0000 mg | Freq: Once | INTRAMUSCULAR | Status: DC
Start: 2022-09-16 — End: 2023-01-18

## 2022-09-16 NOTE — Assessment & Plan Note (Signed)
Well controlled.  Continue to work on eating a healthy diet and exercise.  Labs drawn today.   No major side effects reported, and no issues with compliance. The current medical regimen is effective;  continue present plan with Lipitor 40mg  Will adjust medication as needed depending on labs Lab Results  Component Value Date   LDLCALC 105 (H) 06/10/2022

## 2022-09-16 NOTE — Assessment & Plan Note (Signed)
Well controlled.  Continue to work on eating a healthy diet and exercise.  Labs drawn today.   No major side effects reported, and no issues with compliance. The current medical regimen is effective;  continue present plan with Amlodipine-Benzapril 10-20mg , Aspirin 81mg ,  Will adjust medication as needed depending on labs BP Readings from Last 3 Encounters:  09/16/22 136/78  06/10/22 130/70  04/07/22 139/80

## 2022-09-16 NOTE — Assessment & Plan Note (Signed)
Primarily in his bilateral shoulders with right being the worst Does not want to do surgery Discussed doing an action along with PT. Patient is open to injections and then also trying new oral medication Restarted tramadol 50 mg as patient said that was very helpful before.   Discontinued Voltaren 75 mg

## 2022-09-16 NOTE — Assessment & Plan Note (Addendum)
Well controlled.  Continue to work on eating a healthy diet and exercise.  Labs drawn today.   No major side effects reported, and no issues with compliance. The current medical regimen is effective;  continue present plan with Farxiga 10mg , Tresiba 100 units, Metformin 1000mg . Will adjust medication as needed depending on labs Patient is on Dexcom, will bring monitor next time to add him to our patient list Lab Results  Component Value Date   HGBA1C 6.6 (H) 06/10/2022   HGBA1C 6.9 (H) 04/01/2022   HGBA1C 6.8 (H) 03/10/2022

## 2022-09-16 NOTE — Assessment & Plan Note (Signed)
Controlled Denies any issues or side effects with his medicine Continue taking Flomax .4mg  as directed Will adjust medicine as needed

## 2022-09-17 LAB — COMPREHENSIVE METABOLIC PANEL
ALT: 16 IU/L (ref 0–44)
AST: 14 IU/L (ref 0–40)
Albumin: 4.3 g/dL (ref 3.8–4.8)
Alkaline Phosphatase: 81 IU/L (ref 44–121)
BUN/Creatinine Ratio: 20 (ref 10–24)
BUN: 17 mg/dL (ref 8–27)
Bilirubin Total: 0.6 mg/dL (ref 0.0–1.2)
CO2: 22 mmol/L (ref 20–29)
Calcium: 9.9 mg/dL (ref 8.6–10.2)
Chloride: 105 mmol/L (ref 96–106)
Creatinine, Ser: 0.87 mg/dL (ref 0.76–1.27)
Globulin, Total: 2.7 g/dL (ref 1.5–4.5)
Glucose: 80 mg/dL (ref 70–99)
Potassium: 4.7 mmol/L (ref 3.5–5.2)
Sodium: 144 mmol/L (ref 134–144)
Total Protein: 7 g/dL (ref 6.0–8.5)
eGFR: 92 mL/min/{1.73_m2} (ref >59)

## 2022-09-17 LAB — CBC WITH DIFFERENTIAL/PLATELET
Basophils Absolute: 0.1 10*3/uL (ref 0.0–0.2)
Basos: 1 %
EOS (ABSOLUTE): 0.3 10*3/uL (ref 0.0–0.4)
Eos: 5 %
Hematocrit: 47.1 % (ref 37.5–51.0)
Hemoglobin: 16 g/dL (ref 13.0–17.7)
Immature Grans (Abs): 0.1 10*3/uL (ref 0.0–0.1)
Immature Granulocytes: 1 %
Lymphocytes Absolute: 1.5 10*3/uL (ref 0.7–3.1)
Lymphs: 24 %
MCH: 31.1 pg (ref 26.6–33.0)
MCHC: 34 g/dL (ref 31.5–35.7)
MCV: 92 fL (ref 79–97)
Monocytes Absolute: 0.5 10*3/uL (ref 0.1–0.9)
Monocytes: 8 %
Neutrophils Absolute: 3.8 10*3/uL (ref 1.4–7.0)
Neutrophils: 61 %
Platelets: 364 10*3/uL (ref 150–450)
RBC: 5.14 x10E6/uL (ref 4.14–5.80)
RDW: 14.6 % (ref 11.6–15.4)
WBC: 6.2 10*3/uL (ref 3.4–10.8)

## 2022-09-17 LAB — LIPID PANEL
Chol/HDL Ratio: 2.4 ratio (ref 0.0–5.0)
Cholesterol, Total: 171 mg/dL (ref 100–199)
HDL: 72 mg/dL (ref >39)
LDL Chol Calc (NIH): 83 mg/dL (ref 0–99)
Triglycerides: 87 mg/dL (ref 0–149)
VLDL Cholesterol Cal: 16 mg/dL (ref 5–40)

## 2022-09-17 LAB — HEMOGLOBIN A1C
Est. average glucose Bld gHb Est-mCnc: 143 mg/dL
Hgb A1c MFr Bld: 6.6 % — ABNORMAL HIGH (ref 4.8–5.6)

## 2022-09-17 LAB — VITAMIN B12: Vitamin B-12: 410 pg/mL (ref 232–1245)

## 2022-09-19 NOTE — Addendum Note (Signed)
Addended byBlane Ohara on: 09/19/2022 09:15 PM   Modules accepted: Level of Service

## 2022-09-24 DIAGNOSIS — E1121 Type 2 diabetes mellitus with diabetic nephropathy: Secondary | ICD-10-CM | POA: Diagnosis not present

## 2022-09-24 DIAGNOSIS — E1142 Type 2 diabetes mellitus with diabetic polyneuropathy: Secondary | ICD-10-CM | POA: Diagnosis not present

## 2022-09-25 ENCOUNTER — Encounter: Payer: Self-pay | Admitting: Physician Assistant

## 2022-09-25 ENCOUNTER — Ambulatory Visit (INDEPENDENT_AMBULATORY_CARE_PROVIDER_SITE_OTHER): Payer: Medicare HMO | Admitting: Physician Assistant

## 2022-09-25 VITALS — BP 120/72 | HR 63 | Temp 97.7°F | Resp 16 | Ht 70.0 in | Wt 185.0 lb

## 2022-09-25 DIAGNOSIS — R350 Frequency of micturition: Secondary | ICD-10-CM | POA: Diagnosis not present

## 2022-09-25 DIAGNOSIS — F419 Anxiety disorder, unspecified: Secondary | ICD-10-CM | POA: Diagnosis not present

## 2022-09-25 DIAGNOSIS — R1032 Left lower quadrant pain: Secondary | ICD-10-CM | POA: Diagnosis not present

## 2022-09-25 LAB — POCT URINALYSIS DIP (CLINITEK)
Bilirubin, UA: NEGATIVE
Blood, UA: NEGATIVE
Glucose, UA: 250 mg/dL — AB
Ketones, POC UA: NEGATIVE mg/dL
Leukocytes, UA: NEGATIVE
Nitrite, UA: NEGATIVE
POC PROTEIN,UA: NEGATIVE
Spec Grav, UA: 1.02 (ref 1.010–1.025)
Urobilinogen, UA: 0.2 U/dL
pH, UA: 5.5 (ref 5.0–8.0)

## 2022-09-25 MED ORDER — ALPRAZOLAM 0.5 MG PO TABS
0.5000 mg | ORAL_TABLET | Freq: Two times a day (BID) | ORAL | 0 refills | Status: DC | PRN
Start: 2022-09-25 — End: 2022-10-26

## 2022-09-25 NOTE — Progress Notes (Signed)
Acute Office Visit  Subjective:    Patient ID: Clayton James, male    DOB: 1950-07-11, 72 y.o.   MRN: [id number]  Chief Complaint  Patient presents with   Abdominal Pain    LLQ pain,    HPI: Patient is in today for with complain of frequently urination.  Urinary symptoms  He reports new onset urinary frequency. The current episode started a few days ago and is improving. Patient states symptoms are 4/10 in intensity, occurring intermittently. He  has not been recently treated for similar symptoms. States that he has had this pain before, but it had just gone away. Had a colonoscopy last year that showed he had diverticula throughout the descending colon. Patient denies any fever, vomiting, diarrhea, blood, or mucus in stool.       Past Medical History:  Diagnosis Date   Arthritis    Benign essential hypertension 04/27/2019   Chronic kidney disease, stage 3a (HCC) 04/27/2019   Family history of adverse reaction to anesthesia    Mothers sister had issues, but pt isn't sure what they were   Mixed hyperlipidemia 04/27/2019   Type 2 diabetes mellitus with diabetic polyneuropathy (HCC) 04/27/2019   Type 2 diabetes mellitus with stage 3 chronic kidney disease (HCC) 04/27/2019    Past Surgical History:  Procedure Laterality Date   COLONOSCOPY     ELBOW SURGERY Left 1994   KNEE ARTHROSCOPY Right 1998   KNEE CARTILAGE SURGERY Left 1994   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   Procedure: RIGHT TOTAL KNEE ARTHROPLASTY;  Surgeon: Tarry Kos, MD;  [facility];  Service: Orthopedics;  Laterality: Right;   TOTAL KNEE ARTHROPLASTY Left 04/06/2022   Procedure: LEFT TOTAL KNEE ARTHROPLASTY;  Surgeon: Tarry Kos, MD;  Location: [facility];  Service: Orthopedics;  Laterality: Left;    Family History  Problem Relation Age of Onset   Diabetes Mother    Kidney disease Mother    Heart disease Father     Social History   Socioeconomic History   Marital status: Married     Spouse name: Technical sales engineer   Number of children: 3   Years of education: Not on file   Highest education level: Not on file  Occupational History   Not on file  Tobacco Use   Smoking status: Former    Current packs/day: 0.00    Average packs/day: 0.5 packs/day for 8.0 years (4.0 ttl pk-yrs)    Types: Cigarettes    Start date: 19    Quit date: 2000    Years since quitting: 24.6   Smokeless tobacco: Current    Types: Chew   Tobacco comments:    1 can per week  Vaping Use   Vaping status: Never Used  Substance and Sexual Activity   Alcohol use: Not Currently    Alcohol/week: 2.0 - 3.0 standard drinks of alcohol    Types: 1 Glasses of wine, 1 - 2 Standard drinks or equivalent per week    Comment: sometimes   Drug use: Never   Sexual activity: Yes    Partners: Female  Other Topics Concern   Not on file  Social History Narrative   Oldest son and youngest son passed away   Social Determinants of Health   Financial Resource Strain: Medium Risk (02/11/2022)   Overall Financial Resource Strain (CARDIA)    Difficulty of Paying Living Expenses: Somewhat hard  Food Insecurity: No Food Insecurity (01/01/2022)   Hunger Vital Sign  Worried About Programme researcher, broadcasting/film/video in the Last Year: Never true    Ran Out of Food in the Last Year: Never true  Transportation Needs: No Transportation Needs (02/11/2022)   PRAPARE - Administrator, Civil Service (Medical): No    Lack of Transportation (Non-Medical): No  Physical Activity: Insufficiently Active (01/01/2022)   Exercise Vital Sign    Days of Exercise per Week: 3 days    Minutes of Exercise per Session: 30 min  Stress: No Stress Concern Present (01/01/2022)   [employer] of Occupational Health - Occupational Stress Questionnaire    Feeling of Stress : Not at all  Social Connections: Moderately Isolated (09/16/2022)   Social Connection and Isolation Panel [NHANES]    Frequency of Communication with Friends and Family: More  than three times a week    Frequency of Social Gatherings with Friends and Family: More than three times a week    Attends Religious Services: Never    Database administrator or Organizations: No    Attends Engineer, structural: Not on file    Marital Status: Married  Catering manager Violence: Not At Risk (01/01/2022)   Humiliation, Afraid, Rape, and Kick questionnaire    Fear of Current or Ex-Partner: No    Emotionally Abused: No    Physically Abused: No    Sexually Abused: No    Outpatient Medications Prior to Visit  Medication Sig Dispense Refill   amLODipine-benazepril (LOTREL) 10-20 MG capsule Take 1 capsule by mouth once daily 90 capsule 0   aspirin EC 81 MG tablet Take 1 tablet (81 mg total) by mouth 2 (two) times daily. To be taken after surgery to prevent blood clots 84 tablet 0   atorvastatin (LIPITOR) 40 MG tablet Take 1 tablet by mouth once daily 90 tablet 2   Blood Glucose Monitoring Suppl (ONE TOUCH ULTRA 2) w/Device KIT USE TO CHECK GLUCOSE THREE TIMES DAILY     dapagliflozin propanediol (FARXIGA) 10 MG TABS tablet Take 1 tablet (10 mg total) by mouth daily. 90 tablet 2   glucose blood test strip 1 each by Other route daily. Use as instructed 100 each 2   Insulin Pen Needle (BD PEN NEEDLE MICRO U/F) 32G X 6 MM MISC USE AS DIRECTED ONCE DAILY 100 each 4   metFORMIN (GLUCOPHAGE) 1000 MG tablet Take 1 tablet by mouth twice daily 180 tablet 0   tamsulosin (FLOMAX) 0.4 MG CAPS capsule Take 1 capsule (0.4 mg total) by mouth daily. 90 capsule 1   traMADol (ULTRAM) 50 MG tablet TAKE 1 TABLET BY MOUTH EVERY 8 HOURS AS NEEDED FOR MODERATE PAIN 30 tablet 3   TRESIBA FLEXTOUCH 100 UNIT/ML FlexTouch Pen Inject 30 units subq daily 15 mL 3   ALPRAZolam (XANAX) 0.5 MG tablet Take 1 tablet (0.5 mg total) by mouth 2 (two) times daily as needed. 60 tablet 0   Facility-Administered Medications Prior to Visit  Medication Dose Route Frequency Provider Last Rate Last Admin    triamcinolone acetonide (KENALOG-40) injection 40 mg  40 mg Intra-articular Once         Allergies  Allergen Reactions   Sulfamethoxazole Rash    Review of Systems  Constitutional:  Negative for chills, fatigue and fever.  HENT:  Negative for congestion, ear pain and sore throat.   Respiratory:  Negative for cough and shortness of breath.   Cardiovascular:  Negative for chest pain and palpitations.  Gastrointestinal:  Negative for abdominal pain, constipation,  diarrhea, nausea and vomiting.  Genitourinary:  Negative for difficulty urinating and dysuria.  Musculoskeletal:  Negative for arthralgias, back pain and myalgias.  Skin:  Negative for rash.  Neurological:  Negative for dizziness and headaches.  Psychiatric/Behavioral:  Negative for dysphoric mood.        Objective:        09/25/2022   10:24 AM 09/16/2022    7:26 AM 06/10/2022   11:58 AM  Vitals with BMI  Height 5\' 10"  5\' 10"    Weight 185 lbs 189 lbs   BMI 26.54 27.12   Systolic 120 136 409  Diastolic 72 78 70  Pulse 63 69     Orthostatic VS for the past 72 hrs (Last 3 readings):  Patient Position BP Location Cuff Size  09/25/22 1024 Sitting Left Arm Large     Physical Exam Vitals reviewed.  Constitutional:      Appearance: Normal appearance.  Neck:     Vascular: No carotid bruit.  Cardiovascular:     Rate and Rhythm: Normal rate and regular rhythm.     Heart sounds: Normal heart sounds.  Pulmonary:     Effort: Pulmonary effort is normal.     Breath sounds: Normal breath sounds.  Abdominal:     General: Bowel sounds are normal.     Palpations: Abdomen is soft.     Tenderness: There is abdominal tenderness in the left lower quadrant. There is no guarding or rebound.     Hernia: No hernia is present.  Neurological:     Mental Status: He is alert and oriented to person, place, and time.  Psychiatric:        Mood and Affect: Mood normal.        Behavior: Behavior normal.    Total time spent on today's  visit was greater than 30 minutes, including both face-to-face time and nonface-to-face time personally spent on review of chart (labs and imaging), discussing labs and goals, discussing further work-up, treatment options, referrals to specialist if needed, reviewing outside records of pertinent, answering patient's questions, and coordinating care.  Health Maintenance Due  Topic Date Due   DTaP/Tdap/Td (1 - Tdap) Never done   Zoster Vaccines- Shingrix (1 of 2) Never done   OPHTHALMOLOGY EXAM  07/04/2021   INFLUENZA VACCINE  08/20/2022    There are no preventive care reminders to display for this patient.   No results found for: "TSH" Lab Results  Component Value Date   WBC 6.2 09/16/2022   HGB 16.0 09/16/2022   HCT 47.1 09/16/2022   MCV 92 09/16/2022   PLT 364 09/16/2022   Lab Results  Component Value Date   NA 144 09/16/2022   K 4.7 09/16/2022   CO2 22 09/16/2022   GLUCOSE 80 09/16/2022   BUN 17 09/16/2022   CREATININE 0.87 09/16/2022   BILITOT 0.6 09/16/2022   ALKPHOS 81 09/16/2022   AST 14 09/16/2022   ALT 16 09/16/2022   PROT 7.0 09/16/2022   ALBUMIN 4.3 09/16/2022   CALCIUM 9.9 09/16/2022   ANIONGAP 10 04/01/2022   EGFR 92 09/16/2022   Lab Results  Component Value Date   CHOL 171 09/16/2022   Lab Results  Component Value Date   HDL 72 09/16/2022   Lab Results  Component Value Date   LDLCALC 83 09/16/2022   Lab Results  Component Value Date   TRIG 87 09/16/2022   Lab Results  Component Value Date   CHOLHDL 2.4 09/16/2022   Lab Results  Component Value Date   HGBA1C 6.6 (H) 09/16/2022       Assessment & Plan:  Frequency of urination Assessment & Plan: UA was negative Continue to monitor symptoms   Orders: -     POCT URINALYSIS DIP (CLINITEK)  Left lower quadrant abdominal pain Assessment & Plan: Sent for CT Will send antibiotics depending on results Patient will monitor symptoms and let us know if it worsens  Orders: -     CT  ABDOMEN PELVIS W CONTRAST; Future  Anxiety Assessment & Plan: Controlled Continue taking xanax .5mg  as directed Will adjust treatment depending on symptoms   Orders: -     ALPRAZolam; Take 1 tablet (0.5 mg total) by mouth 2 (two) times daily as needed.  Dispense: 60 tablet; Refill: 0     Meds ordered this encounter  Medications   ALPRAZolam (XANAX) 0.5 MG tablet    Sig: Take 1 tablet (0.5 mg total) by mouth 2 (two) times daily as needed.    Dispense:  60 tablet    Refill:  0    Orders Placed This Encounter  Procedures   CT ABDOMEN PELVIS W CONTRAST   POCT URINALYSIS DIP (CLINITEK)     Follow-up: No follow-ups on file.  An After Visit Summary was printed and given to the patient.  Langley Gauss, [place] [facility] [phone]

## 2022-09-25 NOTE — Assessment & Plan Note (Signed)
UA was negative Continue to monitor symptoms

## 2022-09-25 NOTE — Assessment & Plan Note (Signed)
Sent for CT Will send antibiotics depending on results Patient will monitor symptoms and let us know if it worsens

## 2022-09-25 NOTE — Assessment & Plan Note (Signed)
Controlled Continue taking xanax .5mg  as directed Will adjust treatment depending on symptoms

## 2022-09-30 ENCOUNTER — Other Ambulatory Visit: Payer: Self-pay

## 2022-09-30 DIAGNOSIS — R1032 Left lower quadrant pain: Secondary | ICD-10-CM

## 2022-10-05 ENCOUNTER — Other Ambulatory Visit: Payer: Self-pay

## 2022-10-05 DIAGNOSIS — E782 Mixed hyperlipidemia: Secondary | ICD-10-CM

## 2022-10-06 ENCOUNTER — Ambulatory Visit: Payer: Medicare HMO | Admitting: Physician Assistant

## 2022-10-06 ENCOUNTER — Other Ambulatory Visit (INDEPENDENT_AMBULATORY_CARE_PROVIDER_SITE_OTHER): Payer: Medicare HMO

## 2022-10-06 DIAGNOSIS — M25561 Pain in right knee: Secondary | ICD-10-CM | POA: Diagnosis not present

## 2022-10-06 DIAGNOSIS — Z96652 Presence of left artificial knee joint: Secondary | ICD-10-CM | POA: Diagnosis not present

## 2022-10-06 MED ORDER — ATORVASTATIN CALCIUM 40 MG PO TABS
40.0000 mg | ORAL_TABLET | Freq: Every day | ORAL | 2 refills | Status: DC
Start: 2022-10-06 — End: 2023-07-09

## 2022-10-06 NOTE — Addendum Note (Signed)
Addended by: Cristie Hem on: 10/06/2022 11:00 AM   Modules accepted: Level of Service

## 2022-10-06 NOTE — Progress Notes (Signed)
Post-Op Visit Note   Patient: Clayton James           Date of Birth: 04/23/1950           MRN: [id number] Visit Date: 10/06/2022 PCP: Langley Gauss, PA   Assessment & Plan:  Chief Complaint: No chief complaint on file.  Visit Diagnoses:  1. Status post total left knee replacement     Plan: Patient is a pleasant 72 year old gentleman who comes in today 6 months status post left total knee replacement 04/06/2022 and right total knee replacement 05/26/2021.  He has been doing great in regards to both knees.  No pain.  Notes a little stiffness with increased activity but nothing more.  Examination of both knees reveals range of motion of 0 to 120 degrees.  Knee stable valgus varus stress.  He is neurovascular intact distally.  At this point, continue to increase activity as tolerated.  Follow-up in 6 months for repeat evaluation and x-rays of the left knee.  Call with concerns or questions.  Follow-Up Instructions: Return in about 6 months (around 04/05/2023).   Orders:  Orders Placed This Encounter  Procedures   XR Knee 1-2 Views Left   XR Knee 1-2 Views Right   No orders of the defined types were placed in this encounter.   Imaging: No results found.  PMFS History: Patient Active Problem List   Diagnosis Date Noted   Frequency of urination 09/25/2022   Left lower quadrant abdominal pain 09/25/2022   Anxiety 09/25/2022   Status post total left knee replacement 04/06/2022   Primary osteoarthritis of right knee 05/26/2021   Status post total right knee replacement 05/26/2021   BPH (benign prostatic hyperplasia) 03/20/2021   Rotator cuff tear, right 12/02/2020   Pigmented nevus 04/18/2020   BMI 25.0-25.9,adult 10/04/2019   PVD (peripheral vascular disease) (HCC) 08/28/2019   Primary osteoarthritis of left knee 05/04/2019   Mixed hyperlipidemia 04/27/2019   Chronic kidney disease, stage 3a (HCC) 04/27/2019   Type 2 diabetes mellitus with diabetic polyneuropathy (HCC)  04/27/2019   Type 2 diabetes mellitus with stage 3 chronic kidney disease (HCC) 04/27/2019   Benign essential hypertension 04/27/2019   Osteoarthritis of right knee 04/27/2019   Past Medical History:  Diagnosis Date   Arthritis    Benign essential hypertension 04/27/2019   Chronic kidney disease, stage 3a (HCC) 04/27/2019   Family history of adverse reaction to anesthesia    Mothers sister had issues, but pt isn't sure what they were   Mixed hyperlipidemia 04/27/2019   Type 2 diabetes mellitus with diabetic polyneuropathy (HCC) 04/27/2019   Type 2 diabetes mellitus with stage 3 chronic kidney disease (HCC) 04/27/2019    Family History  Problem Relation Age of Onset   Diabetes Mother    Kidney disease Mother    Heart disease Father     Past Surgical History:  Procedure Laterality Date   COLONOSCOPY     ELBOW SURGERY Left 1994   KNEE ARTHROSCOPY Right 1998   KNEE CARTILAGE SURGERY Left 1994   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   Procedure: RIGHT TOTAL KNEE ARTHROPLASTY;  Surgeon: Tarry Kos, MD;  Location: [facility];  Service: Orthopedics;  Laterality: Right;   TOTAL KNEE ARTHROPLASTY Left 04/06/2022   Procedure: LEFT TOTAL KNEE ARTHROPLASTY;  Surgeon: Tarry Kos, MD;  Location: [facility];  Service: Orthopedics;  Laterality: Left;   Social History   Occupational History   Not on file  Tobacco Use  Smoking status: Former    Current packs/day: 0.00    Average packs/day: 0.5 packs/day for 8.0 years (4.0 ttl pk-yrs)    Types: Cigarettes    Start date: 79    Quit date: 2000    Years since quitting: 24.7   Smokeless tobacco: Current    Types: Chew   Tobacco comments:    1 can per week  Vaping Use   Vaping status: Never Used  Substance and Sexual Activity   Alcohol use: Not Currently    Alcohol/week: 2.0 - 3.0 standard drinks of alcohol    Types: 1 Glasses of wine, 1 - 2 Standard drinks or equivalent per week    Comment: sometimes   Drug use: Never   Sexual  activity: Yes    Partners: Female

## 2022-10-07 DIAGNOSIS — Q631 Lobulated, fused and horseshoe kidney: Secondary | ICD-10-CM | POA: Diagnosis not present

## 2022-10-07 DIAGNOSIS — R1032 Left lower quadrant pain: Secondary | ICD-10-CM | POA: Diagnosis not present

## 2022-10-07 DIAGNOSIS — N2889 Other specified disorders of kidney and ureter: Secondary | ICD-10-CM | POA: Diagnosis not present

## 2022-10-07 DIAGNOSIS — N200 Calculus of kidney: Secondary | ICD-10-CM | POA: Diagnosis not present

## 2022-10-07 DIAGNOSIS — K5730 Diverticulosis of large intestine without perforation or abscess without bleeding: Secondary | ICD-10-CM | POA: Diagnosis not present

## 2022-10-19 ENCOUNTER — Encounter: Payer: Self-pay | Admitting: Physician Assistant

## 2022-10-21 ENCOUNTER — Other Ambulatory Visit: Payer: Self-pay | Admitting: Physician Assistant

## 2022-10-21 DIAGNOSIS — R911 Solitary pulmonary nodule: Secondary | ICD-10-CM

## 2022-10-21 DIAGNOSIS — Q631 Lobulated, fused and horseshoe kidney: Secondary | ICD-10-CM

## 2022-10-23 ENCOUNTER — Other Ambulatory Visit: Payer: Self-pay | Admitting: Physician Assistant

## 2022-10-23 DIAGNOSIS — F419 Anxiety disorder, unspecified: Secondary | ICD-10-CM

## 2022-10-26 ENCOUNTER — Other Ambulatory Visit: Payer: Self-pay | Admitting: Physician Assistant

## 2022-10-26 ENCOUNTER — Telehealth: Payer: Self-pay

## 2022-10-26 DIAGNOSIS — F419 Anxiety disorder, unspecified: Secondary | ICD-10-CM

## 2022-10-26 DIAGNOSIS — M19011 Primary osteoarthritis, right shoulder: Secondary | ICD-10-CM

## 2022-10-26 MED ORDER — TRAMADOL HCL 50 MG PO TABS: ORAL_TABLET | ORAL | 0 refills | Status: DC

## 2022-10-26 MED ORDER — ALPRAZOLAM 0.5 MG PO TABS
0.5000 mg | ORAL_TABLET | Freq: Two times a day (BID) | ORAL | 0 refills | Status: DC | PRN
Start: 2022-10-26 — End: 2022-11-25

## 2022-10-26 NOTE — Telephone Encounter (Signed)
Patient called and stated that he wanted to know if the kidney stones could be causing his discomfort that he is having, and he has not heard from urology about the referral.  Made patient aware that kidney stones could be causes his discomfort and that's why provider made his referral to urology due to his stones bing a pretty big size. Also gave him the information to urologist office they have called him and left him voicemail to schedule his appointment.

## 2022-10-29 DIAGNOSIS — J929 Pleural plaque without asbestos: Secondary | ICD-10-CM | POA: Diagnosis not present

## 2022-10-29 DIAGNOSIS — R918 Other nonspecific abnormal finding of lung field: Secondary | ICD-10-CM | POA: Diagnosis not present

## 2022-10-29 DIAGNOSIS — R911 Solitary pulmonary nodule: Secondary | ICD-10-CM | POA: Diagnosis not present

## 2022-11-05 ENCOUNTER — Encounter: Payer: Self-pay | Admitting: Urology

## 2022-11-05 ENCOUNTER — Ambulatory Visit (INDEPENDENT_AMBULATORY_CARE_PROVIDER_SITE_OTHER): Payer: Medicare HMO | Admitting: Urology

## 2022-11-05 VITALS — BP 154/78 | HR 66 | Ht 70.0 in | Wt 185.0 lb

## 2022-11-05 DIAGNOSIS — N200 Calculus of kidney: Secondary | ICD-10-CM

## 2022-11-05 DIAGNOSIS — N401 Enlarged prostate with lower urinary tract symptoms: Secondary | ICD-10-CM

## 2022-11-05 DIAGNOSIS — Q631 Lobulated, fused and horseshoe kidney: Secondary | ICD-10-CM

## 2022-11-05 DIAGNOSIS — N138 Other obstructive and reflux uropathy: Secondary | ICD-10-CM

## 2022-11-05 NOTE — Progress Notes (Signed)
Assessment: 1. Nephrolithiasis   2. Horseshoe kidney   3. BPH with obstruction/lower urinary tract symptoms     Plan: I personally reviewed the patient's chart including provider notes, lab and imaging results. I personally viewed the CT study from 10/07/2022 results as noted below.  There is evidence of a horseshoe kidney with some right renal calculi.  I do not see anything on the study from a urologic standpoint that would account for his left-sided abdominal pain. Stone prevention discussed with the patient and information provided. Diagnosis of horseshoe kidney discussed with the patient.  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 Continue tamsulosin 0.4 mg daily for BPH with LUTS Return to office in 6 months with KUB.   Chief Complaint:  Chief Complaint  Patient presents with   Nephrolithiasis    History of Present Illness:  Clayton James is a 72 y.o. male who is seen in consultation from [place], [place] for evaluation of nephrolithiasis and horseshoe kidney. He recently underwent evaluation with CT imaging for evaluation of left lower quadrant pain.  The pain lasted approximately 4-5 days and resolve spontaneously.  He has not had any further symptoms.  CT from 10/07/2022 showed a horseshoe kidney with multiple right renal calculi with the largest stone measuring 6 mm in size, mild dilation of the right renal pelvis and mild dilation of the right renal calyces consistent with a chronic right UPJ obstruction, mild dilation of the left renal pelvis. No prior history of kidney stones.  He has lower urinary tract symptoms including frequency, nocturia, intermittent stream, hesitancy.  No dysuria or gross hematuria.  He has been on tamsulosin for management of his symptoms with improvement. IPSS = 13 today.  PSA 2/24: 2.2  Past Medical History:  Past Medical History:  Diagnosis Date   Arthritis    Benign essential hypertension 04/27/2019    Chronic kidney disease, stage 3a (HCC) 04/27/2019   Family history of adverse reaction to anesthesia    Mothers sister had issues, but pt isn't sure what they were   Mixed hyperlipidemia 04/27/2019   Type 2 diabetes mellitus with diabetic polyneuropathy (HCC) 04/27/2019   Type 2 diabetes mellitus with stage 3 chronic kidney disease (HCC) 04/27/2019    Past Surgical History:  Past Surgical History:  Procedure Laterality Date   COLONOSCOPY     ELBOW SURGERY Left 1994   KNEE ARTHROSCOPY Right 1998   KNEE CARTILAGE SURGERY Left 1994   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   Procedure: RIGHT TOTAL KNEE ARTHROPLASTY;  Surgeon: Tarry Kos, MD;  Location: [facility];  Service: Orthopedics;  Laterality: Right;   TOTAL KNEE ARTHROPLASTY Left 04/06/2022   Procedure: LEFT TOTAL KNEE ARTHROPLASTY;  Surgeon: Tarry Kos, MD;  Location: [facility];  Service: Orthopedics;  Laterality: Left;    Allergies:  Allergies  Allergen Reactions   Sulfamethoxazole Rash    Family History:  Family History  Problem Relation Age of Onset   Diabetes Mother    Kidney disease Mother    Heart disease Father     Social History:  Social History   Tobacco Use   Smoking status: Former    Current packs/day: 0.00    Average packs/day: 0.5 packs/day for 8.0 years (4.0 ttl pk-yrs)    Types: Cigarettes    Start date: 89    Quit date: 2000    Years since quitting: 24.8   Smokeless tobacco: Current    Types:  Chew   Tobacco comments:    1 can per week  Vaping Use   Vaping status: Never Used  Substance Use Topics   Alcohol use: Not Currently    Alcohol/week: 2.0 - 3.0 standard drinks of alcohol    Types: 1 Glasses of wine, 1 - 2 Standard drinks or equivalent per week    Comment: sometimes   Drug use: Never    Review of symptoms:  Constitutional:  Negative for unexplained weight loss, night sweats, fever, chills ENT:  Negative for nose bleeds, sinus pain, painful swallowing CV:  Negative for chest pain,  shortness of breath, exercise intolerance, palpitations, loss of consciousness Resp:  Negative for cough, wheezing, shortness of breath GI:  Negative for nausea, vomiting, diarrhea, bloody stools GU:  Positives noted in HPI; otherwise negative for gross hematuria, dysuria, urinary incontinence Neuro:  Negative for seizures, poor balance, limb weakness, slurred speech Psych:  Negative for lack of energy, depression, anxiety Endocrine:  Negative for polydipsia, polyuria, symptoms of hypoglycemia (dizziness, hunger, sweating) Hematologic:  Negative for anemia, purpura, petechia, prolonged or excessive bleeding, use of anticoagulants  Allergic:  Negative for difficulty breathing or choking as a result of exposure to anything; no shellfish allergy; no allergic response (rash/itch) to materials, foods  Physical exam: BP (!) 154/78   Pulse 66   Ht 5\' 10"  (1.778 m)   Wt 185 lb (83.9 kg)   BMI 26.54 kg/m  GENERAL APPEARANCE:  Well appearing, well developed, well nourished, NAD HEENT: Atraumatic, Normocephalic, oropharynx clear. NECK: Supple without lymphadenopathy or thyromegaly. LUNGS: Clear to auscultation bilaterally. HEART: Regular Rate and Rhythm without murmurs, gallops, or rubs. ABDOMEN: Soft, non-tender, No Masses. EXTREMITIES: Moves all extremities well.  Without clubbing, cyanosis, or edema. NEUROLOGIC:  Alert and oriented x 3, normal gait, CN II-XII grossly intact.  MENTAL STATUS:  Appropriate. BACK:  Non-tender to palpation.  No CVAT SKIN:  Warm, dry and intact.   GU: Penis:  circumcised Meatus: Normal Scrotum: normal, no masses Testis: normal without masses bilateral Prostate: 40 g, NT, no nodules Rectum: Normal tone,  no masses or tenderness   Results: U/A: 11-30 WBC, 0 RBC, 3+ glucose

## 2022-11-08 ENCOUNTER — Other Ambulatory Visit: Payer: Self-pay | Admitting: Physician Assistant

## 2022-11-08 DIAGNOSIS — M19011 Primary osteoarthritis, right shoulder: Secondary | ICD-10-CM

## 2022-11-09 ENCOUNTER — Other Ambulatory Visit: Payer: Self-pay | Admitting: Physician Assistant

## 2022-11-09 DIAGNOSIS — M19011 Primary osteoarthritis, right shoulder: Secondary | ICD-10-CM

## 2022-11-09 MED ORDER — TRAMADOL HCL 50 MG PO TABS: ORAL_TABLET | ORAL | Status: DC

## 2022-11-10 ENCOUNTER — Encounter: Payer: Self-pay | Admitting: Physician Assistant

## 2022-11-11 LAB — URINALYSIS, ROUTINE W REFLEX MICROSCOPIC
Bilirubin, UA: NEGATIVE
Ketones, UA: NEGATIVE
Nitrite, UA: NEGATIVE
Protein,UA: NEGATIVE
RBC, UA: NEGATIVE
Specific Gravity, UA: 1.015 (ref 1.005–1.030)
Urobilinogen, Ur: 0.2 mg/dL (ref 0.2–1.0)
pH, UA: 6 (ref 5.0–7.5)

## 2022-11-11 LAB — MICROSCOPIC EXAMINATION

## 2022-11-16 ENCOUNTER — Other Ambulatory Visit: Payer: Self-pay | Admitting: Physician Assistant

## 2022-11-16 DIAGNOSIS — I10 Essential (primary) hypertension: Secondary | ICD-10-CM

## 2022-11-18 ENCOUNTER — Other Ambulatory Visit: Payer: Self-pay | Admitting: Family Medicine

## 2022-11-18 DIAGNOSIS — M19011 Primary osteoarthritis, right shoulder: Secondary | ICD-10-CM

## 2022-11-20 ENCOUNTER — Other Ambulatory Visit: Payer: Self-pay

## 2022-11-20 DIAGNOSIS — M19011 Primary osteoarthritis, right shoulder: Secondary | ICD-10-CM

## 2022-11-23 ENCOUNTER — Other Ambulatory Visit: Payer: Self-pay | Admitting: Physician Assistant

## 2022-11-23 ENCOUNTER — Telehealth: Payer: Self-pay

## 2022-11-23 ENCOUNTER — Other Ambulatory Visit: Payer: Self-pay

## 2022-11-23 DIAGNOSIS — R911 Solitary pulmonary nodule: Secondary | ICD-10-CM

## 2022-11-23 DIAGNOSIS — M19011 Primary osteoarthritis, right shoulder: Secondary | ICD-10-CM

## 2022-11-23 MED ORDER — TRAMADOL HCL 50 MG PO TABS: ORAL_TABLET | ORAL | 0 refills | Status: DC

## 2022-11-23 NOTE — Telephone Encounter (Signed)
Patient states he has his tramadol.

## 2022-11-23 NOTE — Telephone Encounter (Signed)
The pt called this morning to follow-up on a refill request for traMADol (ULTRAM) 50 MG. pt notified that the medication was refused. The pt stated that he is currently out of this medication and has been without the medication for three days now. Unsure for the refusal as it was not listed. Please advise if this medication can be refilled or not.  Also, the pt stated that never got the CT Chest results. The CT chest results are in the chart, I do not see that they have been signed off, please advise.  Pt notified that someone will call him back.

## 2022-11-24 ENCOUNTER — Other Ambulatory Visit: Payer: Self-pay | Admitting: Physician Assistant

## 2022-11-24 DIAGNOSIS — F419 Anxiety disorder, unspecified: Secondary | ICD-10-CM

## 2022-11-30 ENCOUNTER — Telehealth: Payer: Self-pay

## 2022-11-30 NOTE — Telephone Encounter (Signed)
Records have been faxed.

## 2022-11-30 NOTE — Telephone Encounter (Signed)
Source  Clayton James (Patient)   Subject  Clayton James (Patient)   Topic  Medical Record Request - Provider/Facility Request    Communication  Reason for CRM: alexis from [facility]    for supples cgm prestyle Josephine Igo    need a updated last  office notes    faxed to [phone]    , please call Jon Gills at  [phone]

## 2022-12-03 ENCOUNTER — Other Ambulatory Visit: Payer: Self-pay | Admitting: Family Medicine

## 2022-12-03 DIAGNOSIS — E1142 Type 2 diabetes mellitus with diabetic polyneuropathy: Secondary | ICD-10-CM

## 2022-12-10 ENCOUNTER — Telehealth: Payer: Self-pay | Admitting: Physician Assistant

## 2022-12-10 NOTE — Telephone Encounter (Signed)
Left message for patient that we did refer him to [facility] and if he has not heard from them then he should give them a call at [phone].

## 2022-12-14 ENCOUNTER — Other Ambulatory Visit: Payer: Self-pay | Admitting: Family Medicine

## 2022-12-14 DIAGNOSIS — E1142 Type 2 diabetes mellitus with diabetic polyneuropathy: Secondary | ICD-10-CM

## 2022-12-19 ENCOUNTER — Other Ambulatory Visit: Payer: Self-pay | Admitting: Family Medicine

## 2022-12-19 DIAGNOSIS — N401 Enlarged prostate with lower urinary tract symptoms: Secondary | ICD-10-CM

## 2022-12-20 ENCOUNTER — Other Ambulatory Visit: Payer: Self-pay | Admitting: Physician Assistant

## 2022-12-20 DIAGNOSIS — F419 Anxiety disorder, unspecified: Secondary | ICD-10-CM

## 2022-12-20 DIAGNOSIS — M19011 Primary osteoarthritis, right shoulder: Secondary | ICD-10-CM

## 2022-12-22 ENCOUNTER — Ambulatory Visit (INDEPENDENT_AMBULATORY_CARE_PROVIDER_SITE_OTHER): Payer: Medicare HMO

## 2022-12-22 VITALS — Wt 185.0 lb

## 2022-12-22 DIAGNOSIS — Z0000 Encounter for general adult medical examination without abnormal findings: Secondary | ICD-10-CM | POA: Diagnosis not present

## 2022-12-22 NOTE — Patient Instructions (Signed)
Mr. Clayton James , Thank you for taking time to come for your Medicare Wellness Visit. I appreciate your ongoing commitment to your health goals. Please review the following plan we discussed and let me know if I can assist you in the future.   Referrals/Orders/Follow-Ups/Clinician Recommendations: No  This is a list of the screening recommended for you and due dates:  Health Maintenance  Topic Date Due   Eye exam for diabetics  07/04/2021   Flu Shot  08/20/2022   Hemoglobin A1C  03/19/2023   Yearly kidney health urinalysis for diabetes  06/10/2023   Complete foot exam   06/10/2023   Yearly kidney function blood test for diabetes  09/16/2023   Colon Cancer Screening  10/25/2023   Medicare Annual Wellness Visit  12/22/2023   DTaP/Tdap/Td vaccine (2 - Td or Tdap) 05/30/2032   Pneumonia Vaccine  Completed   Hepatitis C Screening  Completed   Zoster (Shingles) Vaccine  Completed   HPV Vaccine  Aged Out   COVID-19 Vaccine  Discontinued   Cologuard (Stool DNA test)  Discontinued    Advanced directives: (Declined) Advance directive discussed with you today. Even though you declined this today, please call our office should you change your mind, and we can give you the proper paperwork for you to fill out.  Next Medicare Annual Wellness Visit scheduled for next year: No

## 2022-12-22 NOTE — Progress Notes (Signed)
Subjective:   Clayton James is a 72 y.o. male who presents for Medicare Annual/Subsequent preventive examination.  Visit Complete: Virtual I connected with  Clayton James on 12/22/22 by a audio enabled telemedicine application and verified that I am speaking with the correct person using two identifiers.  Patient Location: Home  Provider Location: Home Office  I discussed the limitations of evaluation and management by telemedicine. The patient expressed understanding and agreed to proceed.  Vital Signs: Because this visit was a virtual/telehealth visit, some criteria may be missing or patient reported. Any vitals not documented were not able to be obtained and vitals that have been documented are patient reported.  Cardiac Risk Factors include: advanced age (>72men, >2 women);diabetes mellitus;dyslipidemia;family history of premature cardiovascular disease;hypertension;male gender     Objective:    Today's Vitals   12/22/22 1017  Weight: 185 lb (83.9 kg)  PainSc: 0-No pain   Body mass index is 26.54 kg/m.     12/22/2022   10:19 AM 04/01/2022   11:21 AM 05/26/2021    8:23 AM 05/20/2021   11:11 AM 04/24/2020    2:32 PM  Advanced Directives  Does Patient Have a Medical Advance Directive? No No No No Yes  Type of Advance Directive     Healthcare Power of Attorney  Does patient want to make changes to medical advance directive?     No - Patient declined  Copy of Healthcare Power of Attorney in Chart?     No - copy requested  Would patient like information on creating a medical advance directive? No - Patient declined No - Patient declined No - Patient declined No - Patient declined     Current Medications (verified) Outpatient Encounter Medications as of 12/22/2022  Medication Sig   ALPRAZolam (XANAX) 0.5 MG tablet Take 1 tablet by mouth twice daily as needed   amLODipine-benazepril (LOTREL) 10-20 MG capsule Take 1 capsule by mouth once daily   aspirin EC 81 MG tablet Take 1  tablet (81 mg total) by mouth 2 (two) times daily. To be taken after surgery to prevent blood clots   atorvastatin (LIPITOR) 40 MG tablet Take 1 tablet (40 mg total) by mouth daily.   Blood Glucose Monitoring Suppl (ONE TOUCH ULTRA 2) w/Device KIT USE TO CHECK GLUCOSE THREE TIMES DAILY   dapagliflozin propanediol (FARXIGA) 10 MG TABS tablet Take 1 tablet (10 mg total) by mouth daily.   glucose blood test strip 1 each by Other route daily. Use as instructed   Insulin Pen Needle (BD PEN NEEDLE MICRO U/F) 32G X 6 MM MISC USE AS DIRECTED ONCE DAILY   metFORMIN (GLUCOPHAGE) 1000 MG tablet Take 1 tablet by mouth twice daily   tamsulosin (FLOMAX) 0.4 MG CAPS capsule Take 1 capsule by mouth once daily   traMADol (ULTRAM) 50 MG tablet TAKE 1 TABLET BY MOUTH EVERY 8 HOURS AS NEEDED FOR MODERATE PAIN   TRESIBA FLEXTOUCH 100 UNIT/ML FlexTouch Pen INJECT 30 UNITS SUBCUTANEOUSLY ONCE DAILY   Facility-Administered Encounter Medications as of 12/22/2022  Medication   triamcinolone acetonide (KENALOG-40) injection 40 mg    Allergies (verified) Sulfamethoxazole   History: Past Medical History:  Diagnosis Date   Arthritis    Benign essential hypertension 04/27/2019   Chronic kidney disease, stage 3a (HCC) 04/27/2019   Family history of adverse reaction to anesthesia    Mothers sister had issues, but pt isn't sure what they were   Mixed hyperlipidemia 04/27/2019   Type 2 diabetes mellitus  with diabetic polyneuropathy (HCC) 04/27/2019   Type 2 diabetes mellitus with stage 3 chronic kidney disease (HCC) 04/27/2019   Past Surgical History:  Procedure Laterality Date   COLONOSCOPY     ELBOW SURGERY Left 1994   KNEE ARTHROSCOPY Right 1998   KNEE CARTILAGE SURGERY Left 1994   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   Procedure: RIGHT TOTAL KNEE ARTHROPLASTY;  Surgeon: Tarry Kos, MD;  Location: [facility] OR;  Service: Orthopedics;  Laterality: Right;   TOTAL KNEE ARTHROPLASTY Left 04/06/2022   Procedure:  LEFT TOTAL KNEE ARTHROPLASTY;  Surgeon: Tarry Kos, MD;  Location: [facility] OR;  Service: Orthopedics;  Laterality: Left;   Family History  Problem Relation Age of Onset   Diabetes Mother    Kidney disease Mother    Heart disease Father    Social History   Socioeconomic History   Marital status: Married    Spouse name: Technical sales engineer   Number of children: 3   Years of education: Not on file   Highest education level: Not on file  Occupational History   Not on file  Tobacco Use   Smoking status: Former    Current packs/day: 0.00    Average packs/day: 0.5 packs/day for 8.0 years (4.0 ttl pk-yrs)    Types: Cigarettes    Start date: 76    Quit date: 2000    Years since quitting: 24.9   Smokeless tobacco: Current    Types: Chew   Tobacco comments:    1 can per week  Vaping Use   Vaping status: Never Used  Substance and Sexual Activity   Alcohol use: Not Currently    Alcohol/week: 2.0 - 3.0 standard drinks of alcohol    Types: 1 Glasses of wine, 1 - 2 Standard drinks or equivalent per week    Comment: sometimes   Drug use: Never   Sexual activity: Yes    Partners: Female  Other Topics Concern   Not on file  Social History Narrative   Oldest son and youngest son passed away   Social Determinants of Health   Financial Resource Strain: Low Risk  (12/22/2022)   Overall Financial Resource Strain (CARDIA)    Difficulty of Paying Living Expenses: Not hard at all  Food Insecurity: No Food Insecurity (12/22/2022)   Hunger Vital Sign    Worried About Running Out of Food in the Last Year: Never true    Ran Out of Food in the Last Year: Never true  Transportation Needs: No Transportation Needs (12/22/2022)   PRAPARE - Administrator, Civil Service (Medical): No    Lack of Transportation (Non-Medical): No  Physical Activity: Insufficiently Active (12/22/2022)   Exercise Vital Sign    Days of Exercise per Week: 3 days    Minutes of Exercise per Session: 30 min  Stress: No  Stress Concern Present (12/22/2022)   [employer] of Occupational Health - Occupational Stress Questionnaire    Feeling of Stress : Not at all  Social Connections: Moderately Isolated (12/22/2022)   Social Connection and Isolation Panel [NHANES]    Frequency of Communication with Friends and Family: More than three times a week    Frequency of Social Gatherings with Friends and Family: More than three times a week    Attends Religious Services: Never    Database administrator or Organizations: No    Attends Banker Meetings: Never    Marital Status: Married    Tobacco Counseling Ready to quit:  Not Answered Counseling given: Not Answered Tobacco comments: 1 can per week   Clinical Intake:  Pre-visit preparation completed: Yes  Pain : No/denies pain Pain Score: 0-No pain     BMI - recorded: 26.54 Nutritional Status: BMI 25 -29 Overweight Nutritional Risks: None Diabetes: Yes CBG done?: No Did pt. bring in CBG monitor from home?: No  How often do you need to have someone help you when you read instructions, pamphlets, or other written materials from your doctor or pharmacy?: 1 - Never What is the last grade level you completed in school?: HSG; Scientist, forensic Needed?: No  Information entered by :: [employer]. Tarhonda Hollenberg, LPN.   Activities of Daily Living    12/22/2022   10:28 AM 04/01/2022   11:22 AM  In your present state of health, do you have any difficulty performing the following activities:  Hearing? 0   Vision? 0   Difficulty concentrating or making decisions? 0   Walking or climbing stairs? 0   Dressing or bathing? 0   Doing errands, shopping? 0 0  Preparing Food and eating ? N   Using the Toilet? N   In the past six months, have you accidently leaked urine? N   Do you have problems with loss of bowel control? N   Managing your Medications? N   Managing your Finances? N   Housekeeping or managing your Housekeeping? N      Patient Care Team: Langley Gauss, [place] as PCP - General (Physician Assistant) Zettie Pho, [facility] (Inactive) (Pharmacist)  Indicate any recent Medical Services you may have received from other than [facility] providers in the past year (date may be approximate).     Assessment:   This is a routine wellness examination for Clayton James.  Hearing/Vision screen Hearing Screening - Comments:: Denies hearing difficulties.  Vision Screening - Comments:: Wears rx glasses - up to date with routine eye exams with [facility]    Goals Addressed               This Visit's Progress     Client understands the importance of follow-up with providers by attending scheduled visits (pt-stated)        Make it to next year and start back hunting deer.      Depression Screen    12/22/2022   10:23 AM 09/16/2022    7:29 AM 03/10/2022    4:44 PM 03/10/2022   10:01 AM 01/01/2022    1:01 PM 04/03/2021    7:43 AM 06/20/2020    9:07 AM  PHQ 2/9 Scores  PHQ - 2 Score 0 0 0 0 0 0 0  PHQ- 9 Score 0          Fall Risk    12/22/2022   10:23 AM 09/25/2022   10:30 AM 09/16/2022    7:29 AM 03/10/2022    4:44 PM 03/10/2022   10:00 AM  Fall Risk   Falls in the past year? 0 0 0 0 0  Number falls in past yr: 0 0 0 0 0  Injury with Fall? 0  0 0 0  Risk for fall due to : No Fall Risks  No Fall Risks  Impaired mobility  Follow up Falls prevention discussed Falls evaluation completed Falls evaluation completed  Falls evaluation completed    MEDICARE RISK AT HOME: Medicare Risk at Home Any stairs in or around the home?: No If so, are there any without handrails?: No Home free of  loose throw rugs in walkways, pet beds, electrical cords, etc?: Yes Adequate lighting in your home to reduce risk of falls?: Yes Life alert?: No Use of a cane, walker or w/c?: No Grab bars in the bathroom?: No Shower chair or bench in shower?: No Elevated toilet seat or a handicapped toilet?: No  TIMED UP AND GO:  Was  the test performed?  No    Cognitive Function:    12/22/2022   10:21 AM  MMSE - Mini Mental State Exam  Not completed: Unable to complete        12/22/2022   10:21 AM 01/01/2022    1:04 PM 04/24/2020    2:36 PM  6CIT Screen  What Year? 0 points 0 points 0 points  What month? 0 points 0 points 0 points  What time? 0 points 0 points 0 points  Count back from 20 0 points 0 points 0 points  Months in reverse 0 points 0 points 0 points  Repeat phrase 0 points 0 points 0 points  Total Score 0 points 0 points 0 points    Immunizations Immunization History  Administered Date(s) Administered   Fluad Quad(high Dose 65+) 12/02/2020, 11/05/2021, 11/05/2021   Influenza-Unspecified 11/16/2018   PFIZER Comirnaty(Gray Top)Covid-19 Tri-Sucrose Vaccine 03/30/2019, 04/25/2019   PNEUMOCOCCAL CONJUGATE-20 01/01/2022   Pneumococcal Polysaccharide-23 04/24/2020   RSV,unspecified 05/31/2022   Tdap 05/31/2022   Zoster Recombinant(Shingrix) 05/31/2022, 07/27/2022    TDAP status: Up to date  Flu Vaccine status: Due, Education has been provided regarding the importance of this vaccine. Advised may receive this vaccine at local pharmacy or Health Dept. Aware to provide a copy of the vaccination record if obtained from local pharmacy or Health Dept. Verbalized acceptance and understanding.  Pneumococcal vaccine status: Up to date  Covid-19 vaccine status: Completed vaccines  Qualifies for Shingles Vaccine? Yes   Zostavax completed No   Shingrix Completed?: Yes  Screening Tests Health Maintenance  Topic Date Due   OPHTHALMOLOGY EXAM  07/04/2021   INFLUENZA VACCINE  08/20/2022   HEMOGLOBIN A1C  03/19/2023   Diabetic kidney evaluation - Urine ACR  06/10/2023   FOOT EXAM  06/10/2023   Diabetic kidney evaluation - eGFR measurement  09/16/2023   Colonoscopy  10/25/2023   Medicare Annual Wellness (AWV)  12/22/2023   DTaP/Tdap/Td (2 - Td or Tdap) 05/30/2032   Pneumonia Vaccine 71+ Years old   Completed   Hepatitis C Screening  Completed   Zoster Vaccines- Shingrix  Completed   HPV VACCINES  Aged Out   COVID-19 Vaccine  Discontinued   Fecal DNA (Cologuard)  Discontinued    Health Maintenance  Health Maintenance Due  Topic Date Due   OPHTHALMOLOGY EXAM  07/04/2021   INFLUENZA VACCINE  08/20/2022    Colorectal cancer screening: Type of screening: Colonoscopy. Completed 10/24/2020. Repeat every 3 years  Lung Cancer Screening: (Low Dose CT Chest recommended if Age 60-80 years, 20 pack-year currently smoking OR have quit w/in 15years.) does not qualify.   Lung Cancer Screening Referral: no  Additional Screening:  Hepatitis C Screening: does qualify; Completed 11/05/2021  Vision Screening: Recommended annual ophthalmology exams for early detection of glaucoma and other disorders of the eye. Is the patient up to date with their annual eye exam?  Yes  Who is the provider or what is the name of the office in which the patient attends annual eye exams? [facility] If pt is not established with a provider, would they like to be referred to a  provider to establish care? No .   Dental Screening: Recommended annual dental exams for proper oral hygiene  Diabetic Foot Exam: Diabetic Foot Exam: Completed 06/10/2022  Community Resource Referral / Chronic Care Management: CRR required this visit?  No   CCM required this visit?  No     Plan:     I have personally reviewed and noted the following in the patient's chart:   Medical and social history Use of alcohol, tobacco or illicit drugs  Current medications and supplements including opioid prescriptions. Patient is not currently taking opioid prescriptions. Functional ability and status Nutritional status Physical activity Advanced directives List of other physicians Hospitalizations, surgeries, and ER visits in previous 12 months Vitals Screenings to include cognitive, depression, and  falls Referrals and appointments  In addition, I have reviewed and discussed with patient certain preventive protocols, quality metrics, and best practice recommendations. A written personalized care plan for preventive services as well as general preventive health recommendations were provided to patient.     Mickeal Needy, LPN   96/02/[id number]   After Visit Summary: (Declined) Due to this being a telephonic visit, with patients personalized plan was offered to patient but patient Declined AVS at this time   Nurse Notes: None

## 2022-12-25 DIAGNOSIS — E1142 Type 2 diabetes mellitus with diabetic polyneuropathy: Secondary | ICD-10-CM | POA: Diagnosis not present

## 2022-12-25 DIAGNOSIS — E1121 Type 2 diabetes mellitus with diabetic nephropathy: Secondary | ICD-10-CM | POA: Diagnosis not present

## 2023-01-17 NOTE — Progress Notes (Unsigned)
Subjective:  Patient ID: Clayton James, male    DOB: 1950/10/03  Age: 72 y.o. MRN: [id number]  Chief Complaint  Patient presents with   Medical Management of Chronic Issues    HPI   Patient is here for a follow up for his DM, hyperlipidemia and osteoarthirits.   Diabetes Mellitus Type II, follow-up  Last seen for diabetes 4 months ago.  Management since then includes Tresiba 30 units daily, metformin 1000 mg twice daily , Farxiga 10 mg Once daily He reports good compliance with treatment. He is not having side effects.   Home blood sugar records: fasting range: 95-100 one time it was 69 PP runs 145-195, average 120-152  Episodes of hypoglycemia? Yes , patient said he does not feel any different, he does not have signs sweating, tremor and dizziness.   Current insulin regiment: Tresiba 30 units Most Recent Eye Exam: he is overdue, said he will make an appointment   Hypertension, follow-up   He was last seen for hypertension 4 months ago.  BP at that visit is 136 70. Management since that visit includes amlodipine-benazepril 10-20 once daily He reports excellent compliance with treatment. He is not having side effects.  He is exercising. He is adherent to low salt diet.   Outside blood pressures are .  He does not smoke. Patient said he chews tobacco everyday, 1 pouch is good enough for a month.   Use of agents associated with hypertension: none.    Lipid/Cholesterol, follow-up   He was last seen for this 4 months ago.  Management since that visit includes atorvastatin 40 mg OD  He reports excellent compliance with treatment. He is not having side effects.   He is following a Low fat diet. Current exercise: walking Discussed the use of AI scribe software for clinical note transcription with the patient, who gave verbal consent to proceed.  History of Present Illness   A 72 year old male with a history of diabetes and arthritis presents with worsening  shoulder and hand pain, numbness and tingling in the hands, and fluctuating blood sugar levels. The patient reports that the shoulder pain is severe enough to wake him up at night and has led to a recent episode of syncope. The pain is more severe in the right shoulder and is associated with difficulty in performing daily activities like wearing a coat. The patient also reports numbness and tingling in the hands, more so in the left hand, which tends to worsen at night. The patient denies any history of trauma or surgery to the hands or shoulders. The patient is currently on Tramadol for pain management, which he reports helps to some extent.  The patient's blood sugar levels have been fluctuating, with readings often dropping below 70 in the mornings. The patient reports feeling tired and worn out on days when the blood sugar levels are low. The patient is currently on Tresiba 30 units at night for diabetes management.  The patient also mentions a history of exposure to asbestos and a recent diagnosis of a lung nodule. The patient denies any respiratory symptoms.         01/18/2023    7:33 AM 12/22/2022   10:23 AM 09/16/2022    7:29 AM 03/10/2022    4:44 PM 03/10/2022   10:01 AM  Depression screen PHQ 2/9  Decreased Interest 2 0 0 0 0  Down, Depressed, Hopeless 0 0 0 0 0  PHQ - 2 Score 2 0 0 0 0  Altered sleeping 1 0     Tired, decreased energy 1 0     Change in appetite 0 0     Feeling bad or failure about yourself  0 0     Trouble concentrating 0 0     Moving slowly or fidgety/restless 0 0     Suicidal thoughts 0 0     PHQ-9 Score 4 0     Difficult doing work/chores Not difficult at all Not difficult at all           01/18/2023    7:33 AM  Fall Risk   Falls in the past year? 0  Number falls in past yr: 0  Injury with Fall? 0  Risk for fall due to : No Fall Risks  Follow up Falls evaluation completed    Patient Care Team: Langley Gauss, [place] as PCP - General (Physician  Assistant) Zettie Pho, [facility] (Inactive) (Pharmacist)   Review of Systems  Constitutional:  Negative for chills, fatigue and fever.  HENT:  Negative for congestion, ear pain and sore throat.   Respiratory:  Negative for cough and shortness of breath.   Cardiovascular:  Negative for chest pain and palpitations.  Gastrointestinal:  Negative for abdominal pain, constipation, diarrhea, nausea and vomiting.  Genitourinary:  Negative for difficulty urinating and dysuria.  Musculoskeletal:  Positive for arthralgias and myalgias. Negative for back pain.  Skin:  Negative for rash.  Neurological:  Positive for syncope, weakness and numbness. Negative for dizziness, light-headedness and headaches.  Psychiatric/Behavioral:  Negative for dysphoric mood.     Current Outpatient Medications on File Prior to Visit  Medication Sig Dispense Refill   ALPRAZolam (XANAX) 0.5 MG tablet Take 1 tablet by mouth twice daily as needed 60 tablet 0   amLODipine-benazepril (LOTREL) 10-20 MG capsule Take 1 capsule by mouth once daily 90 capsule 0   aspirin EC 81 MG tablet Take 1 tablet (81 mg total) by mouth 2 (two) times daily. To be taken after surgery to prevent blood clots 84 tablet 0   atorvastatin (LIPITOR) 40 MG tablet Take 1 tablet (40 mg total) by mouth daily. 90 tablet 2   Blood Glucose Monitoring Suppl (ONE TOUCH ULTRA 2) w/Device KIT USE TO CHECK GLUCOSE THREE TIMES DAILY     dapagliflozin propanediol (FARXIGA) 10 MG TABS tablet Take 1 tablet (10 mg total) by mouth daily. 90 tablet 2   glucose blood test strip 1 each by Other route daily. Use as instructed 100 each 2   Insulin Pen Needle (BD PEN NEEDLE MICRO U/F) 32G X 6 MM MISC USE AS DIRECTED ONCE DAILY 100 each 4   metFORMIN (GLUCOPHAGE) 1000 MG tablet Take 1 tablet by mouth twice daily 180 tablet 0   SHINGRIX injection Inject 0.5 mLs into the muscle once.     tamsulosin (FLOMAX) 0.4 MG CAPS capsule Take 1 capsule by mouth once daily 90 capsule 0    TRESIBA FLEXTOUCH 100 UNIT/ML FlexTouch Pen INJECT 30 UNITS SUBCUTANEOUSLY ONCE DAILY 15 mL 0   No current facility-administered medications on file prior to visit.   Past Medical History:  Diagnosis Date   Arthritis    Benign essential hypertension 04/27/2019   Chronic kidney disease, stage 3a (HCC) 04/27/2019   Family history of adverse reaction to anesthesia    Mothers sister had issues, but pt isn't sure what they were   Mixed hyperlipidemia 04/27/2019   Type 2 diabetes mellitus with diabetic polyneuropathy (HCC) 04/27/2019   Type  2 diabetes mellitus with stage 3 chronic kidney disease (HCC) 04/27/2019   Past Surgical History:  Procedure Laterality Date   COLONOSCOPY     ELBOW SURGERY Left 1994   KNEE ARTHROSCOPY Right 1998   KNEE CARTILAGE SURGERY Left 1994   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   Procedure: RIGHT TOTAL KNEE ARTHROPLASTY;  Surgeon: Tarry Kos, MD;  Location: [facility] OR;  Service: Orthopedics;  Laterality: Right;   TOTAL KNEE ARTHROPLASTY Left 04/06/2022   Procedure: LEFT TOTAL KNEE ARTHROPLASTY;  Surgeon: Tarry Kos, MD;  Location: [facility] OR;  Service: Orthopedics;  Laterality: Left;    Family History  Problem Relation Age of Onset   Diabetes Mother    Kidney disease Mother    Heart disease Father    Social History   Socioeconomic History   Marital status: Married    Spouse name: Technical sales engineer   Number of children: 3   Years of education: Not on file   Highest education level: Not on file  Occupational History   Not on file  Tobacco Use   Smoking status: Former    Current packs/day: 0.00    Average packs/day: 0.5 packs/day for 8.0 years (4.0 ttl pk-yrs)    Types: Cigarettes    Start date: 54    Quit date: 2000    Years since quitting: 25.0   Smokeless tobacco: Current    Types: Chew   Tobacco comments:    1 can per week  Vaping Use   Vaping status: Never Used  Substance and Sexual Activity   Alcohol use: Not Currently    Alcohol/week: 2.0  - 3.0 standard drinks of alcohol    Types: 1 Glasses of wine, 1 - 2 Standard drinks or equivalent per week    Comment: sometimes   Drug use: Never   Sexual activity: Yes    Partners: Female  Other Topics Concern   Not on file  Social History Narrative   Oldest son and youngest son passed away   Social Drivers of Health   Financial Resource Strain: Low Risk  (12/22/2022)   Overall Financial Resource Strain (CARDIA)    Difficulty of Paying Living Expenses: Not hard at all  Food Insecurity: No Food Insecurity (12/22/2022)   Hunger Vital Sign    Worried About Running Out of Food in the Last Year: Never true    Ran Out of Food in the Last Year: Never true  Transportation Needs: No Transportation Needs (12/22/2022)   PRAPARE - Administrator, Civil Service (Medical): No    Lack of Transportation (Non-Medical): No  Physical Activity: Insufficiently Active (12/22/2022)   Exercise Vital Sign    Days of Exercise per Week: 3 days    Minutes of Exercise per Session: 30 min  Stress: No Stress Concern Present (12/22/2022)   [employer] of Occupational Health - Occupational Stress Questionnaire    Feeling of Stress : Not at all  Social Connections: Moderately Isolated (12/22/2022)   Social Connection and Isolation Panel [NHANES]    Frequency of Communication with Friends and Family: More than three times a week    Frequency of Social Gatherings with Friends and Family: More than three times a week    Attends Religious Services: Never    Database administrator or Organizations: No    Attends Engineer, structural: Never    Marital Status: Married    Objective:  BP 138/70 (BP Location: Left Arm, Patient Position: Sitting, Cuff Size:  Large)   Pulse 64   Temp 98.6 F (37 C) (Temporal)   Resp 16   Ht 5\' 10"  (1.778 m)   Wt 190 lb 3.2 oz (86.3 kg)   SpO2 98%   BMI 27.29 kg/m      01/18/2023    7:27 AM 12/22/2022   10:17 AM 11/05/2022    1:05 PM  BP/Weight   Systolic BP 138  782  Diastolic BP 70  78  Wt. (Lbs) 190.2 185 185  BMI 27.29 kg/m2 26.54 kg/m2 26.54 kg/m2    Physical Exam Vitals reviewed.  Constitutional:      Appearance: Normal appearance.  Cardiovascular:     Rate and Rhythm: Normal rate and regular rhythm.     Heart sounds: Normal heart sounds.  Pulmonary:     Effort: Pulmonary effort is normal.     Breath sounds: Normal breath sounds.  Abdominal:     General: Bowel sounds are normal.     Palpations: Abdomen is soft.     Tenderness: There is no abdominal tenderness.  Musculoskeletal:     Right shoulder: Tenderness present. No swelling, deformity or effusion. Decreased range of motion. Decreased strength.     Left shoulder: Tenderness present. No swelling, deformity or effusion. Decreased range of motion. Decreased strength.  Neurological:     Mental Status: He is alert and oriented to person, place, and time.  Psychiatric:        Mood and Affect: Mood normal.        Behavior: Behavior normal.     Diabetic Foot Exam - Simple   No data filed      Lab Results  Component Value Date   WBC 6.2 09/16/2022   HGB 16.0 09/16/2022   HCT 47.1 09/16/2022   PLT 364 09/16/2022   GLUCOSE 80 09/16/2022   CHOL 171 09/16/2022   TRIG 87 09/16/2022   HDL 72 09/16/2022   LDLCALC 83 09/16/2022   ALT 16 09/16/2022   AST 14 09/16/2022   NA 144 09/16/2022   K 4.7 09/16/2022   CL 105 09/16/2022   CREATININE 0.87 09/16/2022   BUN 17 09/16/2022   CO2 22 09/16/2022   HGBA1C 6.6 (H) 09/16/2022   MICROALBUR 80 05/17/2019      Assessment & Plan:    Primary osteoarthritis of right shoulder Assessment & Plan: Chronic shoulder and hand pain, with numbness and tingling in the hands, particularly the left hand. Suspected carpal tunnel syndrome. Pain is affecting daily activities and quality of life. -Start prednisone taper to reduce inflammation and pain. -Increase Tramadol dose for better pain control. -Consider use of a  night brace for carpal tunnel syndrome. -Refer to orthopedic surgeons for further evaluation of shoulder and hand issues. Patient does not want to have surgery for shoulder replacement. -Has previous injection into right shoulder with short term benefits.  -Encourage physical exercise, focusing on gentle movements and stretches.  Orders: -     traMADol HCl ER; Take 1 tablet (100 mg total) by mouth daily.  Dispense: 30 tablet; Refill: 0 -     predniSONE; Take 3 tablets (60 mg total) by mouth daily with breakfast for 3 days, THEN 2 tablets (40 mg total) daily with breakfast for 3 days, THEN 1 tablet (20 mg total) daily with breakfast for 3 days.  Dispense: 18 tablet; Refill: 0 -     Rheumatoid factor -     CYCLIC CITRUL PEPTIDE ANTIBODY, IGG/IGA -     Sedimentation rate -  C-reactive protein -     ANA w/Reflex -     Uric acid -     Parvovirus B19 antibody, IgG and IgM  Benign essential hypertension Assessment & Plan: Well controlled.  Continue to work on eating a healthy diet and exercise.  Labs drawn today.   No major side effects reported, and no issues with compliance. The current medical regimen is effective;  continue present plan with Lotrel Will adjust medication as needed depending on labs BP Readings from Last 3 Encounters:  01/18/23 138/70  11/05/22 (!) 154/78  09/25/22 120/72     Orders: -     CBC with Differential/Platelet  Type 2 diabetes mellitus with diabetic polyneuropathy, with long-term current use of insulin (HCC) Assessment & Plan: Recent hypoglycemic episodes with morning blood glucose levels frequently below 70. -Reduce Tresiba dose to 25 units nightly. -Monitor fasting blood glucose levels daily and record results. -Plan for follow-up call in one week to review blood glucose levels and adjust medication as needed.  Orders: -     CBC with Differential/Platelet -     Comprehensive metabolic panel -     Hemoglobin A1c  Mixed  hyperlipidemia Assessment & Plan: Controlled Continue to monitor diet and exercise Labs drawn today Will adjust treatment depending on results Continue taking Lipitor 40mg  as directed Lab Results  Component Value Date   LDLCALC 83 09/16/2022     Orders: -     Lipid panel  Chronic kidney disease, stage 3a (HCC) Assessment & Plan: Labs drawn today Continue to monitor for worsening symptoms Will adjust treatment depending on results  Orders: -     Comprehensive metabolic panel  Anxiety Assessment & Plan: Anxiety Reports difficulty shutting off mind at night, possibly related to past traumatic events. -Continue current Xanax regimen. -Monitor for any increase in anxiety symptoms and adjust medication as needed.   Bilateral carpal tunnel syndrome Assessment & Plan: Prescribed prednisone dose pack to help with symptoms Will refer to hand specialist if symptoms continue or worsen Continue to monitor symptoms      Meds ordered this encounter  Medications   traMADol (ULTRAM-ER) 100 MG 24 hr tablet    Sig: Take 1 tablet (100 mg total) by mouth daily.    Dispense:  30 tablet    Refill:  0   predniSONE (DELTASONE) 20 MG tablet    Sig: Take 3 tablets (60 mg total) by mouth daily with breakfast for 3 days, THEN 2 tablets (40 mg total) daily with breakfast for 3 days, THEN 1 tablet (20 mg total) daily with breakfast for 3 days.    Dispense:  18 tablet    Refill:  0    Orders Placed This Encounter  Procedures   CBC with Differential/Platelet   Comprehensive metabolic panel   Hemoglobin A1c   Lipid panel   Rheumatoid factor   CYCLIC CITRUL PEPTIDE ANTIBODY, IGG/IGA   Sedimentation rate   C-reactive protein   ANA w/Reflex   Uric acid   Parvovirus B19 antibody, IgG and IgM    -Order blood work to check A1C and assess for possible rheumatoid arthritis. -Encourage consistent blood pressure monitoring. -Plan for follow-up after completion of prednisone taper and  adjustment of diabetes medication.           Follow-up: No follow-ups on file.   I,Marla I Leal-Borjas,acting as a scribe for [employer], PA.,have documented all relevant documentation on the behalf of Langley Gauss, PA,as directed by  Langley Gauss, PA  while in the presence of Langley Gauss, [place].   An After Visit Summary was printed and given to the patient.  Langley Gauss, [place] [facility] [phone]

## 2023-01-18 ENCOUNTER — Encounter: Payer: Self-pay | Admitting: Physician Assistant

## 2023-01-18 ENCOUNTER — Ambulatory Visit (INDEPENDENT_AMBULATORY_CARE_PROVIDER_SITE_OTHER): Payer: Medicare HMO | Admitting: Physician Assistant

## 2023-01-18 VITALS — BP 138/70 | HR 64 | Temp 98.6°F | Resp 16 | Ht 70.0 in | Wt 190.2 lb

## 2023-01-18 DIAGNOSIS — G5603 Carpal tunnel syndrome, bilateral upper limbs: Secondary | ICD-10-CM | POA: Diagnosis not present

## 2023-01-18 DIAGNOSIS — M19011 Primary osteoarthritis, right shoulder: Secondary | ICD-10-CM

## 2023-01-18 DIAGNOSIS — N1831 Chronic kidney disease, stage 3a: Secondary | ICD-10-CM | POA: Diagnosis not present

## 2023-01-18 DIAGNOSIS — Z794 Long term (current) use of insulin: Secondary | ICD-10-CM

## 2023-01-18 DIAGNOSIS — E782 Mixed hyperlipidemia: Secondary | ICD-10-CM

## 2023-01-18 DIAGNOSIS — I10 Essential (primary) hypertension: Secondary | ICD-10-CM

## 2023-01-18 DIAGNOSIS — F419 Anxiety disorder, unspecified: Secondary | ICD-10-CM | POA: Diagnosis not present

## 2023-01-18 DIAGNOSIS — E1142 Type 2 diabetes mellitus with diabetic polyneuropathy: Secondary | ICD-10-CM

## 2023-01-18 MED ORDER — PREDNISONE 20 MG PO TABS: ORAL_TABLET | ORAL | 0 refills | Status: AC

## 2023-01-18 MED ORDER — TRAMADOL HCL ER 100 MG PO TB24: 100.0000 mg | ORAL_TABLET | Freq: Every day | ORAL | 0 refills | Status: DC

## 2023-01-18 NOTE — Assessment & Plan Note (Signed)
Labs drawn today Continue to monitor for worsening symptoms Will adjust treatment depending on results

## 2023-01-18 NOTE — Assessment & Plan Note (Addendum)
Controlled Continue to monitor diet and exercise Labs drawn today Will adjust treatment depending on results Continue taking Lipitor 40mg  as directed Lab Results  Component Value Date   LDLCALC 83 09/16/2022

## 2023-01-18 NOTE — Assessment & Plan Note (Signed)
Recent hypoglycemic episodes with morning blood glucose levels frequently below 70. -Reduce Tresiba dose to 25 units nightly. -Monitor fasting blood glucose levels daily and record results. -Plan for follow-up call in one week to review blood glucose levels and adjust medication as needed.

## 2023-01-18 NOTE — Assessment & Plan Note (Signed)
Anxiety Reports difficulty shutting off mind at night, possibly related to past traumatic events. -Continue current Xanax regimen. -Monitor for any increase in anxiety symptoms and adjust medication as needed.

## 2023-01-18 NOTE — Assessment & Plan Note (Signed)
Prescribed prednisone dose pack to help with symptoms Will refer to hand specialist if symptoms continue or worsen Continue to monitor symptoms

## 2023-01-18 NOTE — Assessment & Plan Note (Signed)
Well controlled.  Continue to work on eating a healthy diet and exercise.  Labs drawn today.   No major side effects reported, and no issues with compliance. The current medical regimen is effective;  continue present plan with Lotrel Will adjust medication as needed depending on labs BP Readings from Last 3 Encounters:  01/18/23 138/70  11/05/22 (!) 154/78  09/25/22 120/72

## 2023-01-18 NOTE — Assessment & Plan Note (Addendum)
Chronic shoulder and hand pain, with numbness and tingling in the hands, particularly the left hand. Suspected carpal tunnel syndrome. Pain is affecting daily activities and quality of life. -Start prednisone taper to reduce inflammation and pain. -Increase Tramadol dose for better pain control. -Consider use of a night brace for carpal tunnel syndrome. -Refer to orthopedic surgeons for further evaluation of shoulder and hand issues. Patient does not want to have surgery for shoulder replacement. -Has previous injection into right shoulder with short term benefits.  -Encourage physical exercise, focusing on gentle movements and stretches.

## 2023-01-19 LAB — CBC WITH DIFFERENTIAL/PLATELET
Basophils Absolute: 0.1 10*3/uL (ref 0.0–0.2)
Basos: 1 %
EOS (ABSOLUTE): 0.3 10*3/uL (ref 0.0–0.4)
Eos: 5 %
Hematocrit: 46.1 % (ref 37.5–51.0)
Hemoglobin: 15.4 g/dL (ref 13.0–17.7)
Immature Grans (Abs): 0 10*3/uL (ref 0.0–0.1)
Immature Granulocytes: 0 %
Lymphocytes Absolute: 1.3 10*3/uL (ref 0.7–3.1)
Lymphs: 23 %
MCH: 30.6 pg (ref 26.6–33.0)
MCHC: 33.4 g/dL (ref 31.5–35.7)
MCV: 92 fL (ref 79–97)
Monocytes Absolute: 0.5 10*3/uL (ref 0.1–0.9)
Monocytes: 8 %
Neutrophils Absolute: 3.5 10*3/uL (ref 1.4–7.0)
Neutrophils: 63 %
Platelets: 307 10*3/uL (ref 150–450)
RBC: 5.04 x10E6/uL (ref 4.14–5.80)
RDW: 13.6 % (ref 11.6–15.4)
WBC: 5.6 10*3/uL (ref 3.4–10.8)

## 2023-01-19 LAB — COMPREHENSIVE METABOLIC PANEL
ALT: 13 [IU]/L (ref 0–44)
AST: 14 [IU]/L (ref 0–40)
Albumin: 4.4 g/dL (ref 3.8–4.8)
Alkaline Phosphatase: 87 [IU]/L (ref 44–121)
BUN/Creatinine Ratio: 20 (ref 10–24)
BUN: 13 mg/dL (ref 8–27)
Bilirubin Total: 0.6 mg/dL (ref 0.0–1.2)
CO2: 21 mmol/L (ref 20–29)
Calcium: 9.7 mg/dL (ref 8.6–10.2)
Chloride: 106 mmol/L (ref 96–106)
Creatinine, Ser: 0.66 mg/dL — ABNORMAL LOW (ref 0.76–1.27)
Globulin, Total: 2.6 g/dL (ref 1.5–4.5)
Glucose: 70 mg/dL (ref 70–99)
Potassium: 4.1 mmol/L (ref 3.5–5.2)
Sodium: 144 mmol/L (ref 134–144)
Total Protein: 7 g/dL (ref 6.0–8.5)
eGFR: 100 mL/min/{1.73_m2} (ref >59)

## 2023-01-19 LAB — HEMOGLOBIN A1C
Est. average glucose Bld gHb Est-mCnc: 128 mg/dL
Hgb A1c MFr Bld: 6.1 % — ABNORMAL HIGH (ref 4.8–5.6)

## 2023-01-19 LAB — LIPID PANEL
Chol/HDL Ratio: 2.1 {ratio} (ref 0.0–5.0)
Cholesterol, Total: 173 mg/dL (ref 100–199)
HDL: 81 mg/dL (ref >39)
LDL Chol Calc (NIH): 77 mg/dL (ref 0–99)
Triglycerides: 79 mg/dL (ref 0–149)
VLDL Cholesterol Cal: 15 mg/dL (ref 5–40)

## 2023-01-19 LAB — PARVOVIRUS B19 ANTIBODY, IGG AND IGM
Parvovirus B19 IgG: 6.4 {index} — ABNORMAL HIGH (ref 0.0–0.8)
Parvovirus B19 IgM: 0.2 {index} (ref 0.0–0.8)

## 2023-01-19 LAB — RHEUMATOID FACTOR: Rheumatoid fact SerPl-aCnc: <10 [IU]/mL (ref <14.0)

## 2023-01-19 LAB — SEDIMENTATION RATE: Sed Rate: 8 mm/h (ref 0–30)

## 2023-01-19 LAB — CYCLIC CITRUL PEPTIDE ANTIBODY, IGG/IGA: Cyclic Citrullin Peptide Ab: 10 U (ref 0–19)

## 2023-01-19 LAB — URIC ACID: Uric Acid: 4.2 mg/dL (ref 3.8–8.4)

## 2023-01-19 LAB — ANA W/REFLEX: Anti Nuclear Antibody (ANA): NEGATIVE

## 2023-01-19 LAB — C-REACTIVE PROTEIN: CRP: 2 mg/L (ref 0–10)

## 2023-01-21 ENCOUNTER — Other Ambulatory Visit: Payer: Self-pay | Admitting: Physician Assistant

## 2023-01-21 DIAGNOSIS — F419 Anxiety disorder, unspecified: Secondary | ICD-10-CM

## 2023-01-23 ENCOUNTER — Other Ambulatory Visit: Payer: Self-pay | Admitting: Physician Assistant

## 2023-01-23 DIAGNOSIS — E1142 Type 2 diabetes mellitus with diabetic polyneuropathy: Secondary | ICD-10-CM

## 2023-01-27 ENCOUNTER — Telehealth: Payer: Self-pay

## 2023-01-27 NOTE — Telephone Encounter (Signed)
-----   Message from Nola Angles sent at 01/27/2023 10:06 AM EST ----- Regarding: Sugars Patient has had elevated sugars recently. Can we call and see if they have improved or are still elevated? If elevated I would like to know what his morning fasting sugars have been for about 1 week so we can make the necessary adjustments. Thank you

## 2023-01-27 NOTE — Telephone Encounter (Signed)
Called patient left message for patient to call office back. 

## 2023-01-28 NOTE — Telephone Encounter (Signed)
 Fasting Sugar: 01/20/23 : 111 01/21/23: 131 01/22/23: 138 01/23/23: 123 01/23/22: 131 01/25/23: 131 01/28/23: 161  Patient stated that he is feeling better pain wise. Also stated that he has been on steroids and finish his last one today and during the day he has got some at 200.

## 2023-02-01 ENCOUNTER — Encounter: Payer: Self-pay | Admitting: Physician Assistant

## 2023-02-03 ENCOUNTER — Telehealth: Payer: Self-pay

## 2023-02-03 NOTE — Telephone Encounter (Signed)
Copied from CRM [id number]. Topic: Clinical - Prescription Issue >> Feb 03, 2023  2:07 PM Ivette P wrote: Reason for CRM: Pt called in because he is attempting to get a refill on TRESIBA FLEXTOUCH 100 UNIT/ML FlexTouch Pen, all pharmacies in the area have this medication on backorder. Pt is requesting some advice or a call back [phone] / [phone].

## 2023-02-11 ENCOUNTER — Encounter: Payer: Self-pay | Admitting: Physician Assistant

## 2023-02-13 ENCOUNTER — Other Ambulatory Visit: Payer: Self-pay | Admitting: Family Medicine

## 2023-02-13 DIAGNOSIS — E1142 Type 2 diabetes mellitus with diabetic polyneuropathy: Secondary | ICD-10-CM

## 2023-02-16 ENCOUNTER — Other Ambulatory Visit: Payer: Self-pay | Admitting: Family Medicine

## 2023-02-16 ENCOUNTER — Other Ambulatory Visit: Payer: Self-pay | Admitting: Physician Assistant

## 2023-02-16 DIAGNOSIS — I10 Essential (primary) hypertension: Secondary | ICD-10-CM

## 2023-02-16 DIAGNOSIS — M19011 Primary osteoarthritis, right shoulder: Secondary | ICD-10-CM

## 2023-02-18 ENCOUNTER — Other Ambulatory Visit: Payer: Self-pay | Admitting: Physician Assistant

## 2023-02-18 DIAGNOSIS — F419 Anxiety disorder, unspecified: Secondary | ICD-10-CM

## 2023-03-17 ENCOUNTER — Other Ambulatory Visit: Payer: Self-pay | Admitting: Physician Assistant

## 2023-03-17 DIAGNOSIS — M19011 Primary osteoarthritis, right shoulder: Secondary | ICD-10-CM

## 2023-03-18 ENCOUNTER — Other Ambulatory Visit: Payer: Self-pay | Admitting: Physician Assistant

## 2023-03-18 DIAGNOSIS — E1142 Type 2 diabetes mellitus with diabetic polyneuropathy: Secondary | ICD-10-CM | POA: Diagnosis not present

## 2023-03-18 DIAGNOSIS — N401 Enlarged prostate with lower urinary tract symptoms: Secondary | ICD-10-CM

## 2023-03-18 DIAGNOSIS — E1121 Type 2 diabetes mellitus with diabetic nephropathy: Secondary | ICD-10-CM | POA: Diagnosis not present

## 2023-03-21 ENCOUNTER — Other Ambulatory Visit: Payer: Self-pay | Admitting: Physician Assistant

## 2023-03-21 DIAGNOSIS — M19011 Primary osteoarthritis, right shoulder: Secondary | ICD-10-CM

## 2023-03-21 DIAGNOSIS — E1142 Type 2 diabetes mellitus with diabetic polyneuropathy: Secondary | ICD-10-CM

## 2023-03-22 ENCOUNTER — Other Ambulatory Visit: Payer: Self-pay | Admitting: Physician Assistant

## 2023-03-22 DIAGNOSIS — F419 Anxiety disorder, unspecified: Secondary | ICD-10-CM

## 2023-03-30 ENCOUNTER — Other Ambulatory Visit: Payer: Self-pay | Admitting: Family Medicine

## 2023-03-30 DIAGNOSIS — E1142 Type 2 diabetes mellitus with diabetic polyneuropathy: Secondary | ICD-10-CM

## 2023-04-06 ENCOUNTER — Ambulatory Visit: Payer: Medicare HMO | Admitting: Physician Assistant

## 2023-04-06 ENCOUNTER — Other Ambulatory Visit (INDEPENDENT_AMBULATORY_CARE_PROVIDER_SITE_OTHER): Payer: Self-pay

## 2023-04-06 ENCOUNTER — Telehealth: Payer: Self-pay | Admitting: *Deleted

## 2023-04-06 DIAGNOSIS — Z96651 Presence of right artificial knee joint: Secondary | ICD-10-CM

## 2023-04-06 DIAGNOSIS — Z96652 Presence of left artificial knee joint: Secondary | ICD-10-CM

## 2023-04-06 NOTE — Telephone Encounter (Signed)
 1 year post op in office meeting completed.

## 2023-04-06 NOTE — Progress Notes (Signed)
 Post-Op Visit Note   Patient: Clayton James           Date of Birth: 10-08-1950           MRN: [id number] Visit Date: 04/06/2023 PCP: Langley Gauss, PA   Assessment & Plan:  Chief Complaint:  Chief Complaint  Patient presents with   Left Knee - Pain, Follow-up   Visit Diagnoses:  1. Status post total left knee replacement   2. Status post total right knee replacement     Plan: Patient is a pleasant 73 year old gentleman who comes in today for follow-up of bilateral total knee replacements.  Right total knee replacement 05/26/2021 and left total knee replacement 04/06/2022.  He has been doing well.  He does note a little stiffness if he has had a busy day as well as difficulty kneeling but otherwise no complaints.  Examination of both knees reveals range of motion from 0 to 120 degrees.  He is stable valgus varus stress.  He is neurovascularly intact distally.  At this point, he is doing well.  He will continue to advance with activity as tolerated.  Dental prophylaxis reinforced.  Follow-up in 1 year for repeat evaluation and x-rays of the left knee.  Call with concerns or questions.  Follow-Up Instructions: Return in about 1 year (around 04/05/2024).   Orders:  Orders Placed This Encounter  Procedures   XR Knee 1-2 Views Left   XR Knee 1-2 Views Right   No orders of the defined types were placed in this encounter.   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   Frequency of urination 09/25/2022   Left lower quadrant abdominal pain 09/25/2022   Anxiety 09/25/2022   Status post total left knee  replacement 04/06/2022   Primary osteoarthritis of right knee 05/26/2021   Status post total right knee replacement 05/26/2021   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   Rotator cuff tear, right 12/02/2020   Pigmented nevus 04/18/2020   BMI 25.0-25.9,adult 10/04/2019   PVD (peripheral vascular disease) (HCC) 08/28/2019   Primary osteoarthritis of left knee 05/04/2019   Mixed hyperlipidemia 04/27/2019   Chronic kidney disease, stage 3a (HCC) 04/27/2019   Type 2 diabetes mellitus with diabetic polyneuropathy (HCC) 04/27/2019   Type 2 diabetes mellitus with stage 3 chronic kidney disease (HCC) 04/27/2019   Benign essential hypertension 04/27/2019   Osteoarthritis of right knee 04/27/2019   Chronic pain of both knees 09/03/2016   Past Medical History:  Diagnosis Date   Arthritis    Benign essential hypertension 04/27/2019   Chronic kidney disease, stage 3a (HCC) 04/27/2019   Family history of adverse reaction to anesthesia    Mothers sister had issues, but pt isn't sure what they were   Mixed hyperlipidemia 04/27/2019   Type 2 diabetes mellitus with diabetic polyneuropathy (HCC) 04/27/2019   Type 2 diabetes mellitus with stage 3 chronic kidney disease (HCC) 04/27/2019    Family History  Problem Relation Age of Onset   Diabetes Mother    Kidney disease Mother    Heart disease Father     Past Surgical History:  Procedure Laterality Date   COLONOSCOPY  ELBOW SURGERY Left 1994   KNEE ARTHROSCOPY Right 1998   KNEE CARTILAGE SURGERY Left 1994   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   Procedure: RIGHT TOTAL KNEE ARTHROPLASTY;  Surgeon: Tarry Kos, MD;  Location: [facility] OR;  Service: Orthopedics;  Laterality: Right;   TOTAL KNEE ARTHROPLASTY Left 04/06/2022   Procedure: LEFT TOTAL KNEE ARTHROPLASTY;  Surgeon: Tarry Kos, MD;  Location: [facility] OR;  Service: Orthopedics;  Laterality: Left;   Social History   Occupational History   Not on file  Tobacco Use    Smoking status: Former    Current packs/day: 0.00    Average packs/day: 0.5 packs/day for 8.0 years (4.0 ttl pk-yrs)    Types: Cigarettes    Start date: 67    Quit date: 2000    Years since quitting: 25.2   Smokeless tobacco: Current    Types: Chew   Tobacco comments:    1 can per week  Vaping Use   Vaping status: Never Used  Substance and Sexual Activity   Alcohol use: Not Currently    Alcohol/week: 2.0 - 3.0 standard drinks of alcohol    Types: 1 Glasses of wine, 1 - 2 Standard drinks or equivalent per week    Comment: sometimes   Drug use: Never   Sexual activity: Yes    Partners: Female

## 2023-04-19 ENCOUNTER — Other Ambulatory Visit: Payer: Self-pay | Admitting: Family Medicine

## 2023-04-19 DIAGNOSIS — M19011 Primary osteoarthritis, right shoulder: Secondary | ICD-10-CM

## 2023-04-19 DIAGNOSIS — F419 Anxiety disorder, unspecified: Secondary | ICD-10-CM

## 2023-04-21 ENCOUNTER — Ambulatory Visit: Payer: Medicare HMO | Admitting: Physician Assistant

## 2023-04-26 ENCOUNTER — Ambulatory Visit: Payer: Self-pay | Admitting: Acute Care

## 2023-04-26 ENCOUNTER — Ambulatory Visit: Admitting: Acute Care

## 2023-04-26 NOTE — Telephone Encounter (Signed)
 Pt canceled appt. Going to seek care w/ PCP for possible Kidney Stone. Clayton James. NFN

## 2023-04-26 NOTE — Telephone Encounter (Signed)
 Copied from CRM [id number]. Topic: Clinical - Red Word Triage >> Apr 26, 2023  7:36 AM Dimitri Ped wrote: [facility] that prompted transfer to Nurse Triage: in a lot of pain and wants to reschedule . Patient disconnected while on call please call patient back  [phone]. Tried calling patient back no response . Please give patient a call. Same agent received call back from pt.  TRIAGE SUMMARY NOTE: Pt reporting that he is opting to cancel and reschedule today's new pt pulm appt since he is having trouble with flank pain, does not think he can make the drive over to pulm. Pt reporting that he has been having left flank pain 7-8/10 with movement, can be okay with pain at rest "tolerable," not improving much with meds, hx kidney stones. Advised pt be examined today for pain, offered to schedule with PCP office today, pt declines at this time, states he will call back or use MyChart, states he will "collect my thoughts and see what I want to do." Advised pt go to ED if worsening pain or if pain not relieved by meds. Pt verbalized understanding. Cancelled pulm appt, sending HP message to pulm for call back to pt to reschedule appt. Sending HP message to PCP. Please advise.  E2C2 Pulmonary Triage - Initial Assessment Questions "Chief Complaint (e.g., cough, sob, wheezing, fever, chills, sweat or additional symptoms) *Go to specific symptom protocol after initial questions. While back had pain in lower left side, got CAT scans, kidney stones, that's how they found stuff on lungs too, saw kidney doc too, started to hurt again, of course it's today If not moving so much it's tolerable, just to drive the car pain Take a couple aspirin, tramadol for arthritis, not so much helping with the flank pain Doesn't really hurt with sleep, just if moving Been okay with the breathing and all Still having full streams of urine  "How long have symptoms been present?" Started Saturday, got worse yesterday  Reason for  Disposition  MODERATE pain (e.g., interferes with normal activities or awakens from sleep)  Answer Assessment - Initial Assessment Questions 3. SEVERITY: "How bad is the pain?" (e.g., Scale 1-10; mild, moderate, or severe)   - MILD (1-3): doesn't interfere with normal activities    - MODERATE (4-7): interferes with normal activities or awakens from sleep    - SEVERE (8-10): excruciating pain and patient unable to do normal activities (stays in bed)       Bouncing between 7-8 4. PATTERN: "Does the pain come and go, or is it constant?"      If sitting and relaxing, not as awful, when moving seems to be more 5. CAUSE: "What do you think is causing the pain?"     Hx kidney stones 6. OTHER SYMPTOMS:  "Do you have any other symptoms?" (e.g., fever, abdomen pain, vomiting, leg weakness, burning with urination, blood in urine)     When BM gets a little better     Denies other symptoms  Protocols used: Flank Pain-A-AH

## 2023-04-27 NOTE — Progress Notes (Unsigned)
 Subjective:  Patient ID: Clayton James, male    DOB: 09-May-1950  Age: 73 y.o. MRN: [id number]  No chief complaint on file.   Discussed the use of AI scribe software for clinical note transcription with the patient, who gave verbal consent to proceed.         01/18/2023    7:33 AM 12/22/2022   10:23 AM 09/16/2022    7:29 AM 03/10/2022    4:44 PM 03/10/2022   10:01 AM  Depression screen PHQ 2/9  Decreased Interest 2 0 0 0 0  Down, Depressed, Hopeless 0 0 0 0 0  PHQ - 2 Score 2 0 0 0 0  Altered sleeping 1 0     Tired, decreased energy 1 0     Change in appetite 0 0     Feeling bad or failure about yourself  0 0     Trouble concentrating 0 0     Moving slowly or fidgety/restless 0 0     Suicidal thoughts 0 0     PHQ-9 Score 4 0     Difficult doing work/chores Not difficult at all Not difficult at all           01/18/2023    7:33 AM  Fall Risk   Falls in the past year? 0  Number falls in past yr: 0  Injury with Fall? 0  Risk for fall due to : No Fall Risks  Follow up Falls evaluation completed    Patient Care Team: Langley Gauss, [place] as PCP - General (Physician Assistant) Zettie Pho, [facility] (Inactive) (Pharmacist)   Review of Systems  Constitutional:  Negative for appetite change, fatigue and fever.  HENT:  Negative for congestion, ear pain, sinus pressure and sore throat.   Respiratory:  Negative for cough, chest tightness, shortness of breath and wheezing.   Cardiovascular:  Negative for chest pain and palpitations.  Gastrointestinal:  Negative for abdominal pain, constipation, diarrhea, nausea and vomiting.  Genitourinary:  Negative for dysuria and hematuria.  Musculoskeletal:  Negative for arthralgias, back pain, joint swelling and myalgias.  Skin:  Negative for rash.  Neurological:  Negative for dizziness, weakness and headaches.  Psychiatric/Behavioral:  Negative for dysphoric mood. The patient is not nervous/anxious.     Current Outpatient Medications  on File Prior to Visit  Medication Sig Dispense Refill   ALPRAZolam (XANAX) 0.5 MG tablet Take 1 tablet by mouth twice daily as needed 60 tablet 0   amLODipine-benazepril (LOTREL) 10-20 MG capsule Take 1 capsule by mouth once daily 90 capsule 0   atorvastatin (LIPITOR) 40 MG tablet Take 1 tablet (40 mg total) by mouth daily. 90 tablet 2   Blood Glucose Monitoring Suppl (ONE TOUCH ULTRA 2) w/Device KIT USE TO CHECK GLUCOSE THREE TIMES DAILY     FARXIGA 10 MG TABS tablet Take 1 tablet by mouth once daily 90 tablet 0   glucose blood test strip 1 each by Other route daily. Use as instructed 100 each 2   Insulin Pen Needle (BD PEN NEEDLE MICRO U/F) 32G X 6 MM MISC USE AS DIRECTED ONCE DAILY 100 each 3   metFORMIN (GLUCOPHAGE) 1000 MG tablet Take 1 tablet by mouth twice daily 180 tablet 1   SHINGRIX injection Inject 0.5 mLs into the muscle once.     tamsulosin (FLOMAX) 0.4 MG CAPS capsule Take 1 capsule by mouth once daily 90 capsule 0   traMADol (ULTRAM-ER) 100 MG 24 hr tablet Take 1 tablet  by mouth once daily 30 tablet 0   TRESIBA FLEXTOUCH 100 UNIT/ML FlexTouch Pen INJECT 30 UNITS SUBCUTANEOUSLY ONCE DAILY 15 mL 0   No current facility-administered medications on file prior to visit.   Past Medical History:  Diagnosis Date   Arthritis    Benign essential hypertension 04/27/2019   Chronic kidney disease, stage 3a (HCC) 04/27/2019   Family history of adverse reaction to anesthesia    Mothers sister had issues, but pt isn't sure what they were   Mixed hyperlipidemia 04/27/2019   Type 2 diabetes mellitus with diabetic polyneuropathy (HCC) 04/27/2019   Type 2 diabetes mellitus with stage 3 chronic kidney disease (HCC) 04/27/2019   Past Surgical History:  Procedure Laterality Date   COLONOSCOPY     ELBOW SURGERY Left 1994   KNEE ARTHROSCOPY Right 1998   KNEE CARTILAGE SURGERY Left 1994   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   Procedure: RIGHT TOTAL KNEE ARTHROPLASTY;  Surgeon: Tarry Kos, MD;  Location: [facility] OR;  Service: Orthopedics;  Laterality: Right;   TOTAL KNEE ARTHROPLASTY Left 04/06/2022   Procedure: LEFT TOTAL KNEE ARTHROPLASTY;  Surgeon: Tarry Kos, MD;  Location: [facility] OR;  Service: Orthopedics;  Laterality: Left;    Family History  Problem Relation Age of Onset   Diabetes Mother    Kidney disease Mother    Heart disease Father    Social History   Socioeconomic History   Marital status: Married    Spouse name: Technical sales engineer   Number of children: 3   Years of education: Not on file   Highest education level: Not on file  Occupational History   Not on file  Tobacco Use   Smoking status: Former    Current packs/day: 0.00    Average packs/day: 0.5 packs/day for 8.0 years (4.0 ttl pk-yrs)    Types: Cigarettes    Start date: 48    Quit date: 2000    Years since quitting: 25.2   Smokeless tobacco: Current    Types: Chew   Tobacco comments:    1 can per week  Vaping Use   Vaping status: Never Used  Substance and Sexual Activity   Alcohol use: Not Currently    Alcohol/week: 2.0 - 3.0 standard drinks of alcohol    Types: 1 Glasses of wine, 1 - 2 Standard drinks or equivalent per week    Comment: sometimes   Drug use: Never   Sexual activity: Yes    Partners: Female  Other Topics Concern   Not on file  Social History Narrative   Oldest son and youngest son passed away   Social Drivers of Health   Financial Resource Strain: Low Risk  (12/22/2022)   Overall Financial Resource Strain (CARDIA)    Difficulty of Paying Living Expenses: Not hard at all  Food Insecurity: No Food Insecurity (12/22/2022)   Hunger Vital Sign    Worried About Running Out of Food in the Last Year: Never true    Ran Out of Food in the Last Year: Never true  Transportation Needs: No Transportation Needs (12/22/2022)   PRAPARE - Administrator, Civil Service (Medical): No    Lack of Transportation (Non-Medical): No  Physical Activity: Insufficiently Active  (12/22/2022)   Exercise Vital Sign    Days of Exercise per Week: 3 days    Minutes of Exercise per Session: 30 min  Stress: No Stress Concern Present (12/22/2022)   [employer] of Occupational Health - Occupational Stress Questionnaire  Feeling of Stress : Not at all  Social Connections: Moderately Isolated (12/22/2022)   Social Connection and Isolation Panel [NHANES]    Frequency of Communication with Friends and Family: More than three times a week    Frequency of Social Gatherings with Friends and Family: More than three times a week    Attends Religious Services: Never    Database administrator or Organizations: No    Attends Banker Meetings: Never    Marital Status: Married    Objective:  There were no vitals taken for this visit.     01/18/2023    7:27 AM 12/22/2022   10:17 AM 11/05/2022    1:05 PM  BP/Weight  Systolic BP 138  629  Diastolic BP 70  78  Wt. (Lbs) 190.2 185 185  BMI 27.29 kg/m2 26.54 kg/m2 26.54 kg/m2    Physical Exam  Diabetic Foot Exam - Simple   No data filed      Lab Results  Component Value Date   WBC 5.6 01/18/2023   HGB 15.4 01/18/2023   HCT 46.1 01/18/2023   PLT 307 01/18/2023   GLUCOSE 70 01/18/2023   CHOL 173 01/18/2023   TRIG 79 01/18/2023   HDL 81 01/18/2023   LDLCALC 77 01/18/2023   ALT 13 01/18/2023   AST 14 01/18/2023   NA 144 01/18/2023   K 4.1 01/18/2023   CL 106 01/18/2023   CREATININE 0.66 (L) 01/18/2023   BUN 13 01/18/2023   CO2 21 01/18/2023   HGBA1C 6.1 (H) 01/18/2023   MICROALBUR 80 05/17/2019      Assessment & Plan:  Assessment and Plan       Type 2 diabetes mellitus with diabetic polyneuropathy, with long-term current use of insulin (HCC)  Benign essential hypertension  Mixed hyperlipidemia  Chronic kidney disease, stage 3a (HCC)  Anxiety  Bilateral carpal tunnel syndrome  Primary osteoarthritis of right shoulder     No orders of the defined types were placed in this  encounter.   No orders of the defined types were placed in this encounter.    Follow-up: No follow-ups on file.   I,Lauren M Auman,acting as a Neurosurgeon for [employer], PA.,have documented all relevant documentation on the behalf of Langley Gauss, PA,as directed by  Langley Gauss, PA while in the presence of Langley Gauss, [place].   An After Visit Summary was printed and given to the patient.  Langley Gauss, [place] [facility] [phone]

## 2023-04-28 ENCOUNTER — Encounter: Payer: Self-pay | Admitting: Physician Assistant

## 2023-04-28 ENCOUNTER — Ambulatory Visit (INDEPENDENT_AMBULATORY_CARE_PROVIDER_SITE_OTHER): Admitting: Physician Assistant

## 2023-04-28 VITALS — BP 148/82 | HR 69 | Temp 97.8°F | Ht 70.0 in | Wt 186.0 lb

## 2023-04-28 DIAGNOSIS — F419 Anxiety disorder, unspecified: Secondary | ICD-10-CM

## 2023-04-28 DIAGNOSIS — Z794 Long term (current) use of insulin: Secondary | ICD-10-CM

## 2023-04-28 DIAGNOSIS — N138 Other obstructive and reflux uropathy: Secondary | ICD-10-CM | POA: Diagnosis not present

## 2023-04-28 DIAGNOSIS — R350 Frequency of micturition: Secondary | ICD-10-CM | POA: Diagnosis not present

## 2023-04-28 DIAGNOSIS — E1142 Type 2 diabetes mellitus with diabetic polyneuropathy: Secondary | ICD-10-CM | POA: Diagnosis not present

## 2023-04-28 DIAGNOSIS — R1032 Left lower quadrant pain: Secondary | ICD-10-CM

## 2023-04-28 DIAGNOSIS — G5603 Carpal tunnel syndrome, bilateral upper limbs: Secondary | ICD-10-CM

## 2023-04-28 DIAGNOSIS — I10 Essential (primary) hypertension: Secondary | ICD-10-CM | POA: Diagnosis not present

## 2023-04-28 DIAGNOSIS — K5904 Chronic idiopathic constipation: Secondary | ICD-10-CM

## 2023-04-28 DIAGNOSIS — K5900 Constipation, unspecified: Secondary | ICD-10-CM | POA: Insufficient documentation

## 2023-04-28 DIAGNOSIS — N1831 Chronic kidney disease, stage 3a: Secondary | ICD-10-CM

## 2023-04-28 DIAGNOSIS — N401 Enlarged prostate with lower urinary tract symptoms: Secondary | ICD-10-CM

## 2023-04-28 DIAGNOSIS — M19011 Primary osteoarthritis, right shoulder: Secondary | ICD-10-CM

## 2023-04-28 DIAGNOSIS — E782 Mixed hyperlipidemia: Secondary | ICD-10-CM

## 2023-04-28 MED ORDER — HYDROCODONE-ACETAMINOPHEN 7.5-300 MG PO TABS: 1.0000 | ORAL_TABLET | Freq: Four times a day (QID) | ORAL | 0 refills | Status: DC | PRN

## 2023-04-28 NOTE — Assessment & Plan Note (Signed)
 Controlled Continue to monitor diet and exercise Labs drawn today Will adjust treatment depending on results Continue taking Lipitor 40mg  as directed Lab Results  Component Value Date   LDLCALC 77 01/18/2023

## 2023-04-28 NOTE — Assessment & Plan Note (Signed)
 Infrequent bowel movements with limited relief from Dulcolax. Constipation may contribute to flank pain but stone etiology is more likely. - Advise taking Dulcolax every 8 hours to aid bowel movements.

## 2023-04-28 NOTE — Assessment & Plan Note (Signed)
 Diabetes well-controlled with stable blood sugar levels around 108 mg/dL.

## 2023-04-28 NOTE — Assessment & Plan Note (Signed)
 Chronic flank pain likely due to kidney stones. Pain is severe, intermittent, and possibly related to stone movement. Hernia is less likely. Current pain management is inadequate. - Order abdominal CT to assess for kidney stones, particularly on the left side. - Obtain urine sample to check for blood and other indicators of kidney stones. - Prescribe different pain medication for better pain management. - Advise use of heat to help relax ureter and alleviate pain. - Continue follow-up with urologist on May 05, 2023.

## 2023-04-28 NOTE — Assessment & Plan Note (Signed)
 Labs drawn today Continue to monitor for worsening symptoms Will adjust treatment depending on results

## 2023-04-28 NOTE — Assessment & Plan Note (Signed)
 Anxiety Reports difficulty shutting off mind at night, possibly related to past traumatic events. -Continue current Xanax regimen. -Monitor for any increase in anxiety symptoms and adjust medication as needed.

## 2023-04-28 NOTE — Patient Instructions (Signed)
 VISIT SUMMARY:  You came in today because of worsening chronic flank pain, which you have been experiencing intermittently. You also mentioned that your current pain medication, Tramadol, is not effective for this pain. Additionally, you have a history of high blood pressure and diabetes, both of which are currently well-managed.  YOUR PLAN:  -FLANK PAIN: Your chronic flank pain is likely due to kidney stones, which can cause severe and intermittent pain. We will order an abdominal CT scan to check for kidney stones and obtain a urine sample to look for blood and other indicators. You will be prescribed a different pain medication for better management, and using heat may help relax the ureter and alleviate pain. Please continue your follow-up with the urologist on May 05, 2023.  -CONSTIPATION: Constipation can cause infrequent bowel movements and may contribute to your flank pain, although kidney stones are more likely the cause. You should take Dulcolax every 8 hours to help with bowel movements.  -DIABETES MELLITUS: Your diabetes is well-controlled with stable blood sugar levels around 108 mg/dL. No changes are needed at this time.  -HYPERTENSION: Your high blood pressure has been previously noted, and stress and pain may contribute to elevated readings. We will recheck your blood pressure to ensure it is within the target range.  -GENERAL HEALTH MAINTENANCE: You have received your COVID-19 and shingles vaccinations. It is important to stay up-to-date with vaccinations to protect your health.  INSTRUCTIONS:  Please follow up with your urologist on May 05, 2023. Complete the abdominal CT scan before your urologist appointment. We will reassess your pain management and kidney stone status after we have the imaging results.

## 2023-04-28 NOTE — Assessment & Plan Note (Signed)
 Previously high blood pressure readings. Stress and pain may contribute to elevated readings. - Recheck blood pressure to ensure it is within target range.

## 2023-04-28 NOTE — Assessment & Plan Note (Signed)
 Controlled Denies any issues or side effects with his medicine Continue taking Flomax .4mg  as directed Will adjust medicine as needed Continue taking for help clearing current kidney stone

## 2023-04-29 LAB — COMPREHENSIVE METABOLIC PANEL WITH GFR
ALT: 11 IU/L (ref 0–44)
AST: 12 IU/L (ref 0–40)
Albumin: 4.6 g/dL (ref 3.8–4.8)
Alkaline Phosphatase: 78 IU/L (ref 44–121)
BUN/Creatinine Ratio: 26 — ABNORMAL HIGH (ref 10–24)
BUN: 20 mg/dL (ref 8–27)
Bilirubin Total: 0.5 mg/dL (ref 0.0–1.2)
CO2: 21 mmol/L (ref 20–29)
Calcium: 9.9 mg/dL (ref 8.6–10.2)
Chloride: 105 mmol/L (ref 96–106)
Creatinine, Ser: 0.76 mg/dL (ref 0.76–1.27)
Globulin, Total: 2.4 g/dL (ref 1.5–4.5)
Glucose: 87 mg/dL (ref 70–99)
Potassium: 4 mmol/L (ref 3.5–5.2)
Sodium: 144 mmol/L (ref 134–144)
Total Protein: 7 g/dL (ref 6.0–8.5)
eGFR: 95 mL/min/{1.73_m2} (ref >59)

## 2023-04-29 LAB — CBC WITH DIFFERENTIAL/PLATELET
Basophils Absolute: 0.1 10*3/uL (ref 0.0–0.2)
Basos: 1 %
EOS (ABSOLUTE): 0.2 10*3/uL (ref 0.0–0.4)
Eos: 2 %
Hematocrit: 47.7 % (ref 37.5–51.0)
Hemoglobin: 16.1 g/dL (ref 13.0–17.7)
Immature Grans (Abs): 0 10*3/uL (ref 0.0–0.1)
Immature Granulocytes: 0 %
Lymphocytes Absolute: 0.9 10*3/uL (ref 0.7–3.1)
Lymphs: 13 %
MCH: 31.4 pg (ref 26.6–33.0)
MCHC: 33.8 g/dL (ref 31.5–35.7)
MCV: 93 fL (ref 79–97)
Monocytes Absolute: 0.5 10*3/uL (ref 0.1–0.9)
Monocytes: 7 %
Neutrophils Absolute: 5.1 10*3/uL (ref 1.4–7.0)
Neutrophils: 77 %
Platelets: 334 10*3/uL (ref 150–450)
RBC: 5.13 x10E6/uL (ref 4.14–5.80)
RDW: 13.7 % (ref 11.6–15.4)
WBC: 6.7 10*3/uL (ref 3.4–10.8)

## 2023-04-29 LAB — HEMOGLOBIN A1C
Est. average glucose Bld gHb Est-mCnc: 134 mg/dL
Hgb A1c MFr Bld: 6.3 % — ABNORMAL HIGH (ref 4.8–5.6)

## 2023-04-29 LAB — LIPID PANEL
Chol/HDL Ratio: 2.1 ratio (ref 0.0–5.0)
Cholesterol, Total: 155 mg/dL (ref 100–199)
HDL: 75 mg/dL (ref >39)
LDL Chol Calc (NIH): 69 mg/dL (ref 0–99)
Triglycerides: 51 mg/dL (ref 0–149)
VLDL Cholesterol Cal: 11 mg/dL (ref 5–40)

## 2023-04-29 LAB — MICROALBUMIN / CREATININE URINE RATIO
Creatinine, Urine: 69.6 mg/dL
Microalb/Creat Ratio: 152 mg/g{creat} — ABNORMAL HIGH (ref 0–29)
Microalbumin, Urine: 105.9 ug/mL

## 2023-04-29 LAB — PSA: Prostate Specific Ag, Serum: 3.1 ng/mL (ref 0.0–4.0)

## 2023-04-29 NOTE — Telephone Encounter (Signed)
 He cancelled due to flank pain, Abdominal CT scheduled for tomorrow.

## 2023-04-30 ENCOUNTER — Ambulatory Visit (HOSPITAL_BASED_OUTPATIENT_CLINIC_OR_DEPARTMENT_OTHER)
Admission: RE | Admit: 2023-04-30 | Discharge: 2023-04-30 | Disposition: A | Discharge status: Home / Self Care | Source: Ambulatory Visit | Attending: Physician Assistant | Admitting: Physician Assistant

## 2023-04-30 DIAGNOSIS — R109 Unspecified abdominal pain: Secondary | ICD-10-CM | POA: Diagnosis not present

## 2023-04-30 DIAGNOSIS — R1032 Left lower quadrant pain: Secondary | ICD-10-CM | POA: Diagnosis not present

## 2023-04-30 DIAGNOSIS — Q631 Lobulated, fused and horseshoe kidney: Secondary | ICD-10-CM | POA: Diagnosis not present

## 2023-04-30 DIAGNOSIS — K5730 Diverticulosis of large intestine without perforation or abscess without bleeding: Secondary | ICD-10-CM | POA: Diagnosis not present

## 2023-04-30 DIAGNOSIS — N200 Calculus of kidney: Secondary | ICD-10-CM | POA: Diagnosis not present

## 2023-05-02 ENCOUNTER — Encounter: Payer: Self-pay | Admitting: Physician Assistant

## 2023-05-03 ENCOUNTER — Ambulatory Visit: Admitting: Physician Assistant

## 2023-05-03 ENCOUNTER — Ambulatory Visit: Payer: Self-pay

## 2023-05-03 ENCOUNTER — Encounter: Payer: Self-pay | Admitting: Physician Assistant

## 2023-05-03 VITALS — BP 138/68 | HR 74 | Temp 97.4°F | Ht 70.0 in | Wt 186.0 lb

## 2023-05-03 DIAGNOSIS — M545 Low back pain, unspecified: Secondary | ICD-10-CM | POA: Diagnosis not present

## 2023-05-03 DIAGNOSIS — N200 Calculus of kidney: Secondary | ICD-10-CM | POA: Diagnosis not present

## 2023-05-03 DIAGNOSIS — E1122 Type 2 diabetes mellitus with diabetic chronic kidney disease: Secondary | ICD-10-CM

## 2023-05-03 DIAGNOSIS — R809 Proteinuria, unspecified: Secondary | ICD-10-CM | POA: Insufficient documentation

## 2023-05-03 DIAGNOSIS — N401 Enlarged prostate with lower urinary tract symptoms: Secondary | ICD-10-CM | POA: Insufficient documentation

## 2023-05-03 DIAGNOSIS — R1032 Left lower quadrant pain: Secondary | ICD-10-CM | POA: Diagnosis not present

## 2023-05-03 DIAGNOSIS — R350 Frequency of micturition: Secondary | ICD-10-CM | POA: Diagnosis not present

## 2023-05-03 DIAGNOSIS — N1831 Chronic kidney disease, stage 3a: Secondary | ICD-10-CM

## 2023-05-03 DIAGNOSIS — R801 Persistent proteinuria, unspecified: Secondary | ICD-10-CM | POA: Diagnosis not present

## 2023-05-03 LAB — POCT URINALYSIS DIP (CLINITEK)
Bilirubin, UA: NEGATIVE
Blood, UA: NEGATIVE
Glucose, UA: 250 mg/dL — AB
Ketones, POC UA: NEGATIVE mg/dL
Leukocytes, UA: NEGATIVE
Nitrite, UA: NEGATIVE
Spec Grav, UA: 1.015 (ref 1.010–1.025)
Urobilinogen, UA: NEGATIVE U/dL — AB
pH, UA: 6 (ref 5.0–8.0)

## 2023-05-03 MED ORDER — HYDROCODONE-ACETAMINOPHEN 7.5-300 MG PO TABS: 1.0000 | ORAL_TABLET | Freq: Four times a day (QID) | ORAL | 0 refills | Status: AC | PRN

## 2023-05-03 MED ORDER — TAMSULOSIN HCL 0.4 MG PO CAPS: 0.4000 mg | ORAL_CAPSULE | Freq: Every day | ORAL | 3 refills | Status: AC

## 2023-05-03 NOTE — Progress Notes (Signed)
 Acute Office Visit  Subjective:    Patient ID: Clayton James, male    DOB: [birth year], 73 y.o.   MRN: [id number]  Chief Complaint  Patient presents with   Low back pain    HPI: Patient is in today for continued flank pain with worsening severity.  Discussed the use of AI scribe software for clinical note transcription with the patient, who gave verbal consent to proceed.  History of Present Illness   The patient, with a history of kidney stones and diabetes, presents with continued flank pain. The pain, according to the patient, is persistent and does not seem to go away this time. The patient had a CT scan but the results are not yet available. The patient describes the pain as terrible and mentions that it is aggravated when he is active. The patient also reports discomfort when he needs to urinate, but does not report any other urinary symptoms. The patient's diabetes seems to be under control, with blood sugar levels hovering around 100-110. The patient also mentions that he is scheduled to see a urologist and a lung doctor in the coming week.       Past Medical History:  Diagnosis Date   Arthritis    Benign essential hypertension 04/27/2019   Chronic kidney disease, stage 3a (HCC) 04/27/2019   Family history of adverse reaction to anesthesia    Mothers sister had issues, but pt isn't sure what they were   Mixed hyperlipidemia 04/27/2019   Type 2 diabetes mellitus with diabetic polyneuropathy (HCC) 04/27/2019   Type 2 diabetes mellitus with stage 3 chronic kidney disease (HCC) 04/27/2019    Past Surgical History:  Procedure Laterality Date   COLONOSCOPY     ELBOW SURGERY Left 1994   KNEE ARTHROSCOPY Right 1998   KNEE CARTILAGE SURGERY Left 1994   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   Procedure: RIGHT TOTAL KNEE ARTHROPLASTY;  Surgeon: Tarry Kos, MD;  Location: [facility] OR;  Service: Orthopedics;  Laterality: Right;   TOTAL KNEE ARTHROPLASTY Left 04/06/2022    Procedure: LEFT TOTAL KNEE ARTHROPLASTY;  Surgeon: Tarry Kos, MD;  Location: [facility] OR;  Service: Orthopedics;  Laterality: Left;    Family History  Problem Relation Age of Onset   Diabetes Mother    Kidney disease Mother    Heart disease Father     Social History   Socioeconomic History   Marital status: Married    Spouse name: Technical sales engineer   Number of children: 3   Years of education: Not on file   Highest education level: Not on file  Occupational History   Not on file  Tobacco Use   Smoking status: Former    Current packs/day: 0.00    Average packs/day: 0.5 packs/day for 8.0 years (4.0 ttl pk-yrs)    Types: Cigarettes    Start date: 78    Quit date: 2000    Years since quitting: 25.3   Smokeless tobacco: Current    Types: Chew   Tobacco comments:    1 can per week  Vaping Use   Vaping status: Never Used  Substance and Sexual Activity   Alcohol use: Not Currently    Alcohol/week: 2.0 - 3.0 standard drinks of alcohol    Types: 1 Glasses of wine, 1 - 2 Standard drinks or equivalent per week    Comment: sometimes   Drug use: Never   Sexual activity: Yes    Partners: Female  Other Topics Concern   Not  on file  Social History Narrative   Oldest son and youngest son passed away   Social Drivers of Health   Financial Resource Strain: Low Risk  (12/22/2022)   Overall Financial Resource Strain (CARDIA)    Difficulty of Paying Living Expenses: Not hard at all  Food Insecurity: No Food Insecurity (12/22/2022)   Hunger Vital Sign    Worried About Running Out of Food in the Last Year: Never true    Ran Out of Food in the Last Year: Never true  Transportation Needs: No Transportation Needs (12/22/2022)   PRAPARE - Administrator, Civil Service (Medical): No    Lack of Transportation (Non-Medical): No  Physical Activity: Insufficiently Active (12/22/2022)   Exercise Vital Sign    Days of Exercise per Week: 3 days    Minutes of Exercise per Session: 30 min   Stress: No Stress Concern Present (12/22/2022)   [employer] of Occupational Health - Occupational Stress Questionnaire    Feeling of Stress : Not at all  Social Connections: Moderately Isolated (12/22/2022)   Social Connection and Isolation Panel [NHANES]    Frequency of Communication with Friends and Family: More than three times a week    Frequency of Social Gatherings with Friends and Family: More than three times a week    Attends Religious Services: Never    Database administrator or Organizations: No    Attends Banker Meetings: Never    Marital Status: Married  Catering manager Violence: Not At Risk (12/22/2022)   Humiliation, Afraid, Rape, and Kick questionnaire    Fear of Current or Ex-Partner: No    Emotionally Abused: No    Physically Abused: No    Sexually Abused: No    Outpatient Medications Prior to Visit  Medication Sig Dispense Refill   ALPRAZolam (XANAX) 0.5 MG tablet Take 1 tablet by mouth twice daily as needed 60 tablet 0   amLODipine-benazepril (LOTREL) 10-20 MG capsule Take 1 capsule by mouth once daily 90 capsule 0   atorvastatin (LIPITOR) 40 MG tablet Take 1 tablet (40 mg total) by mouth daily. 90 tablet 2   Blood Glucose Monitoring Suppl (ONE TOUCH ULTRA 2) w/Device KIT USE TO CHECK GLUCOSE THREE TIMES DAILY     FARXIGA 10 MG TABS tablet Take 1 tablet by mouth once daily 90 tablet 0   glucose blood test strip 1 each by Other route daily. Use as instructed 100 each 2   Insulin Pen Needle (BD PEN NEEDLE MICRO U/F) 32G X 6 MM MISC USE AS DIRECTED ONCE DAILY 100 each 3   metFORMIN (GLUCOPHAGE) 1000 MG tablet Take 1 tablet by mouth twice daily 180 tablet 1   SHINGRIX injection Inject 0.5 mLs into the muscle once.     traMADol (ULTRAM-ER) 100 MG 24 hr tablet Take 1 tablet by mouth once daily 30 tablet 0   TRESIBA FLEXTOUCH 100 UNIT/ML FlexTouch Pen INJECT 30 UNITS SUBCUTANEOUSLY ONCE DAILY 15 mL 0   HYDROcodone-Acetaminophen 7.5-300 MG TABS Take 1  tablet by mouth every 6 (six) hours as needed for up to 5 days (Severe pain with kidney stone). 18 tablet 0   tamsulosin (FLOMAX) 0.4 MG CAPS capsule Take 1 capsule by mouth once daily 90 capsule 0   No facility-administered medications prior to visit.    Allergies  Allergen Reactions   Sulfamethoxazole Rash    Review of Systems  Constitutional:  Negative for appetite change, fatigue and fever.  HENT:  Negative  for congestion, ear pain, sinus pressure and sore throat.   Respiratory:  Negative for cough, chest tightness, shortness of breath and wheezing.   Cardiovascular:  Negative for chest pain and palpitations.  Gastrointestinal:  Negative for abdominal pain, constipation, diarrhea, nausea and vomiting.  Genitourinary:  Negative for dysuria and hematuria.  Musculoskeletal:  Positive for back pain. Negative for arthralgias, joint swelling and myalgias.  Skin:  Negative for rash.  Neurological:  Negative for dizziness, weakness and headaches.  Psychiatric/Behavioral:  Negative for dysphoric mood. The patient is not nervous/anxious.        Objective:        05/03/2023    1:20 PM 04/28/2023    9:42 AM 04/28/2023    8:18 AM  Vitals with BMI  Height 5\' 10"   5\' 10"   Weight 186 lbs  186 lbs  BMI 26.69  26.69  Systolic 138 148 782  Diastolic 68 82 68  Pulse 74  69    Orthostatic VS for the past 72 hrs (Last 3 readings):  Patient Position BP Location  05/03/23 1320 Sitting Left Arm     Physical Exam Vitals reviewed.  Constitutional:      Appearance: Normal appearance.  Cardiovascular:     Rate and Rhythm: Normal rate and regular rhythm.     Heart sounds: Normal heart sounds.  Pulmonary:     Effort: Pulmonary effort is normal.     Breath sounds: Normal breath sounds.  Abdominal:     General: Bowel sounds are normal.     Palpations: Abdomen is soft.     Tenderness: There is no abdominal tenderness. There is right CVA tenderness and left CVA tenderness. There is no  guarding or rebound.  Neurological:     Mental Status: He is alert and oriented to person, place, and time.  Psychiatric:        Mood and Affect: Mood normal.        Behavior: Behavior normal.     Health Maintenance Due  Topic Date Due   OPHTHALMOLOGY EXAM  07/04/2021    There are no preventive care reminders to display for this patient.   No results found for: "TSH" Lab Results  Component Value Date   WBC 6.7 04/28/2023   HGB 16.1 04/28/2023   HCT 47.7 04/28/2023   MCV 93 04/28/2023   PLT 334 04/28/2023   Lab Results  Component Value Date   NA 144 04/28/2023   K 4.0 04/28/2023   CO2 21 04/28/2023   GLUCOSE 87 04/28/2023   BUN 20 04/28/2023   CREATININE 0.76 04/28/2023   BILITOT 0.5 04/28/2023   ALKPHOS 78 04/28/2023   AST 12 04/28/2023   ALT 11 04/28/2023   PROT 7.0 04/28/2023   ALBUMIN 4.6 04/28/2023   CALCIUM 9.9 04/28/2023   ANIONGAP 10 04/01/2022   EGFR 95 04/28/2023   Lab Results  Component Value Date   CHOL 155 04/28/2023   Lab Results  Component Value Date   HDL 75 04/28/2023   Lab Results  Component Value Date   LDLCALC 69 04/28/2023   Lab Results  Component Value Date   TRIG 51 04/28/2023   Lab Results  Component Value Date   CHOLHDL 2.1 04/28/2023   Lab Results  Component Value Date   HGBA1C 6.3 (H) 04/28/2023       Assessment & Plan:  Acute bilateral low back pain without sciatica Assessment & Plan: Continue to monitor symptoms Will wait for results of CT  Orders: -  POCT URINALYSIS DIP (CLINITEK)  Left lower quadrant abdominal pain Assessment & Plan: Continued pain with no symptoms relief Pain has now been more back pain Continue to monitor Will wait for reading on CT  Orders: -     HYDROcodone-Acetaminophen; Take 1 tablet by mouth every 6 (six) hours as needed for up to 5 days (Severe pain with kidney stone).  Dispense: 20 tablet; Refill: 0  Benign prostatic hyperplasia with urinary frequency Assessment &  Plan: Controlled Denies any worsening or changing urinary symptoms Continue taking flomax .4mg  to help with kidney stones as well.   Orders: -     Tamsulosin HCl; Take 1 capsule (0.4 mg total) by mouth daily.  Dispense: 90 capsule; Refill: 3  Nephrolithiasis Assessment & Plan: Persistent right flank pain likely due to kidney stones. Imaging suggests stones; radiologist's report pending. Discussed lithotripsy and surgical removal for larger stones. - Prescribed hydrocodone for pain management until urology appointment. - Scheduled urology appointment on May 05, 2023. - Prescribed tamsulosin to aid in stone passage. - Await radiologist's report on CT scan for further evaluation.   Persistent proteinuria Assessment & Plan: Mild proteinuria possibly related to diabetes or kidney damage. Awaiting urologist evaluation.   Type 2 diabetes mellitus with stage 3a chronic kidney disease, without long-term current use of insulin (HCC) Assessment & Plan: Diabetes well-controlled with current regimen. Blood sugar levels stable, occasional spikes not exceeding 200 mg/dL. Insulin dosage reduced from 30 to 25 units, maintaining stable glucose levels.       Meds ordered this encounter  Medications   HYDROcodone-Acetaminophen 7.5-300 MG TABS    Sig: Take 1 tablet by mouth every 6 (six) hours as needed for up to 5 days (Severe pain with kidney stone).    Dispense:  20 tablet    Refill:  0   tamsulosin (FLOMAX) 0.4 MG CAPS capsule    Sig: Take 1 capsule (0.4 mg total) by mouth daily.    Dispense:  90 capsule    Refill:  3    Orders Placed This Encounter  Procedures   POCT URINALYSIS DIP (CLINITEK)    General Health Maintenance Maintains diet low in sugars and cholesterol. No tobacco or alcohol use. Engages in regular physical activity despite pain. - Follow up with urologist on May 05, 2023. - Monitor for radiologist's report on CT scan. - Continue communication through [employer] for  updates and concerns.      Follow-up: Return if symptoms worsen or fail to improve, for [facility].  An After Visit Summary was printed and given to the patient.   I,Lauren M Auman,acting as a Neurosurgeon for [employer], [employer].,have documented all relevant documentation on the behalf of Odilia Bennett, PA,as directed by  Odilia Bennett, PA while in the presence of Odilia Bennett, [place].    Odilia Bennett, [place] [facility] [phone]

## 2023-05-03 NOTE — Assessment & Plan Note (Signed)
 Diabetes well-controlled with current regimen. Blood sugar levels stable, occasional spikes not exceeding 200 mg/dL. Insulin dosage reduced from 30 to 25 units, maintaining stable glucose levels.

## 2023-05-03 NOTE — Assessment & Plan Note (Signed)
 Mild proteinuria possibly related to diabetes or kidney damage. Awaiting urologist evaluation.

## 2023-05-03 NOTE — Assessment & Plan Note (Signed)
 Continued pain with no symptoms relief Pain has now been more back pain Continue to monitor Will wait for reading on CT

## 2023-05-03 NOTE — Telephone Encounter (Signed)
 Copied from CRM [id number]. Topic: Clinical - Red Word Triage >> May 03, 2023 10:25 AM Rosaria Common wrote: Red Word that prompted transfer to Nurse Triage: Kidney Stones causing severe pain for pt. Warm transfer due to red word.   Chief Complaint: Flank pain Symptoms: Left sided flank pain Frequency: Intermittent  Pertinent Negatives: Patient denies urinary symptoms  Disposition: [] ED /[] Urgent Care (no appt availability in office) / [x] Appointment(In office/virtual)/ []  [place] [facility]/ [] Home Care/ [] Refused Recommended Disposition /[] [facility]/ []  Follow-up with PCP Additional Notes: Patient reports he was seen last week and prescribed pain medication for his flank pain. He states he has 1 pill left and does not see his urologist until Wednesday, and would like a refill to help control his pain until then. I advised him that it could take 2-3 days for a refill request to be completed but that it might be faster if he had an office visit. Patient understood and is agreeable with this plan. Appointment made for today to discuss management of his pain until his urologist appointment.     Reason for Disposition  MODERATE pain (e.g., interferes with normal activities or awakens from sleep)  Answer Assessment - Initial Assessment Questions 1. LOCATION: "Where does it hurt?" (e.g., left, right)     Left sided  2. ONSET: "When did the pain start?"     1 week 3. SEVERITY: "How bad is the pain?" (e.g., Scale 1-10; mild, moderate, or severe)   - MILD (1-3): doesn't interfere with normal activities    - MODERATE (4-7): interferes with normal activities or awakens from sleep    - SEVERE (8-10): excruciating pain and patient unable to do normal activities (stays in bed)       7-8/10 without medication  4. PATTERN: "Does the pain come and go, or is it constant?"      Intermittent  5. CAUSE: "What do you think is causing the pain?"     Possible kidney stone  6. OTHER SYMPTOMS:  "Do  you have any other symptoms?" (e.g., fever, abdomen pain, vomiting, leg weakness, burning with urination, blood in urine)     No  Protocols used: Flank Pain-A-AH

## 2023-05-03 NOTE — Patient Instructions (Signed)
 VISIT SUMMARY:  You came in today because of ongoing flank pain, which you suspect is due to kidney stones. You also mentioned that your diabetes is under control, and you have upcoming appointments with a urologist and a lung doctor.  YOUR PLAN:  -FLANK PAIN DUE TO SUSPECTED KIDNEY STONES: Your persistent right flank pain is likely due to kidney stones. We discussed treatment options like lithotripsy and surgical removal for larger stones. You have been prescribed hydrocodone for pain management and tamsulosin to help pass the stones. We are waiting for the radiologist's report on your CT scan for further evaluation.  -PROTEINURIA: Proteinuria means there is protein in your urine, which could be related to your diabetes or potential kidney damage. We will wait for the urologist's evaluation for more information.  -DIABETES MELLITUS: Your diabetes is well-controlled with your current treatment plan. Your blood sugar levels are stable, and we have reduced your insulin dosage from 30 to 25 units, which is maintaining your glucose levels well.  -GENERAL HEALTH MAINTENANCE: You are maintaining a healthy lifestyle with a diet low in sugars and cholesterol, no tobacco or alcohol use, and regular physical activity despite your pain. Continue to follow these good habits.  INSTRUCTIONS:  Please follow up with the urologist on May 05, 2023, and monitor for the radiologist's report on your CT scan. Continue to communicate through MyChart for any updates or concerns.

## 2023-05-03 NOTE — Assessment & Plan Note (Signed)
 Persistent right flank pain likely due to kidney stones. Imaging suggests stones; radiologist's report pending. Discussed lithotripsy and surgical removal for larger stones. - Prescribed hydrocodone for pain management until urology appointment. - Scheduled urology appointment on May 05, 2023. - Prescribed tamsulosin to aid in stone passage. - Await radiologist's report on CT scan for further evaluation.

## 2023-05-03 NOTE — Assessment & Plan Note (Signed)
 Continue to monitor symptoms Will wait for results of CT

## 2023-05-03 NOTE — Assessment & Plan Note (Signed)
 Controlled Denies any worsening or changing urinary symptoms Continue taking flomax .4mg  to help with kidney stones as well.

## 2023-05-05 ENCOUNTER — Encounter: Payer: Self-pay | Admitting: Urology

## 2023-05-05 ENCOUNTER — Ambulatory Visit: Payer: Medicare HMO | Admitting: Urology

## 2023-05-05 VITALS — BP 176/76 | HR 91 | Ht 70.0 in | Wt 186.0 lb

## 2023-05-05 DIAGNOSIS — Q631 Lobulated, fused and horseshoe kidney: Secondary | ICD-10-CM | POA: Diagnosis not present

## 2023-05-05 DIAGNOSIS — N200 Calculus of kidney: Secondary | ICD-10-CM | POA: Diagnosis not present

## 2023-05-05 DIAGNOSIS — N138 Other obstructive and reflux uropathy: Secondary | ICD-10-CM

## 2023-05-05 DIAGNOSIS — N529 Male erectile dysfunction, unspecified: Secondary | ICD-10-CM | POA: Insufficient documentation

## 2023-05-05 DIAGNOSIS — N401 Enlarged prostate with lower urinary tract symptoms: Secondary | ICD-10-CM

## 2023-05-05 MED ORDER — TADALAFIL 5 MG PO TABS: 5.0000 mg | ORAL_TABLET | Freq: Every day | ORAL | 0 refills | Status: DC | PRN

## 2023-05-05 NOTE — Progress Notes (Signed)
 Assessment: 1. Nephrolithiasis   2. Horseshoe kidney   3. BPH with obstruction/lower urinary tract symptoms   4. Organic impotence     Plan: I reviewed the CT study from 04/30/2023.  The nephrolithiasis appears stable.  I do not see any evidence of obstruction to the left renal moiety.  Consequently, I do not think that his left-sided back and lower abdominal pain is urologic in nature. Continue stone prevention  Continue tamsulosin 0.4 mg daily for BPH with LUTS Option for management of erectile dysfunction discussed.  He would like to try oral therapy with tadalafil 5 mg daily.  Prescription provided.  Use and side effects discussed. Will await final radiology report on the CT scan.  Discussed potential need for evaluation for other possible causes of his symptoms. Return to office in 6 weeks  Chief Complaint:  Chief Complaint  Patient presents with   Nephrolithiasis    History of Present Illness:  Clayton James is a 73 y.o. male who is seen for continued evaluation of nephrolithiasis and horseshoe kidney. He underwent evaluation with CT imaging for evaluation of left lower quadrant pain in 9/24.  The pain lasted approximately 4-5 days and resolved spontaneously.  No further symptoms.   CT from 10/07/2022 showed a horseshoe kidney with multiple right renal calculi with the largest stone measuring 6 mm in size, mild dilation of the right renal pelvis and mild dilation of the right renal calyces consistent with a chronic right UPJ obstruction, mild dilation of the left renal pelvis. No prior history of kidney stones.  He has a history of  lower urinary tract symptoms including frequency, nocturia, intermittent stream, hesitancy.  No dysuria or gross hematuria.  He has been on tamsulosin for management of his symptoms with improvement. IPSS = 13.  PSA 2/24: 2.2 PSA 2/25: 3.1  He return today for follow-up.  He has recently had a recurrence of his left back and lower abdominal  pain.  He reports this has been severe at times.  He is taking tramadol for the pain intermittently.  He reports that the pain medicine does help.  No fevers, chills, nausea, or vomiting.  No dysuria or gross hematuria.  He had a CT done on 04/30/2023 for further evaluation of his symptoms. IPSS = 3.  Portions of the above documentation were copied from a prior visit for review purposes only.   Past Medical History:  Past Medical History:  Diagnosis Date   Arthritis    Benign essential hypertension 04/27/2019   Chronic kidney disease, stage 3a (HCC) 04/27/2019   Family history of adverse reaction to anesthesia    Mothers sister had issues, but pt isn't sure what they were   Mixed hyperlipidemia 04/27/2019   Type 2 diabetes mellitus with diabetic polyneuropathy (HCC) 04/27/2019   Type 2 diabetes mellitus with stage 3 chronic kidney disease (HCC) 04/27/2019    Past Surgical History:  Past Surgical History:  Procedure Laterality Date   COLONOSCOPY     ELBOW SURGERY Left 1994   KNEE ARTHROSCOPY Right 1998   KNEE CARTILAGE SURGERY Left 1994   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   Procedure: RIGHT TOTAL KNEE ARTHROPLASTY;  Surgeon: Tarry Kos, MD;  Location: [facility] OR;  Service: Orthopedics;  Laterality: Right;   TOTAL KNEE ARTHROPLASTY Left 04/06/2022   Procedure: LEFT TOTAL KNEE ARTHROPLASTY;  Surgeon: Tarry Kos, MD;  Location: [facility] OR;  Service: Orthopedics;  Laterality: Left;    Allergies:  Allergies  Allergen  Reactions   Sulfamethoxazole Rash    Family History:  Family History  Problem Relation Age of Onset   Diabetes Mother    Kidney disease Mother    Heart disease Father     Social History:  Social History   Tobacco Use   Smoking status: Former    Current packs/day: 0.00    Average packs/day: 0.5 packs/day for 8.0 years (4.0 ttl pk-yrs)    Types: Cigarettes    Start date: 58    Quit date: 2000    Years since quitting: 25.3   Smokeless tobacco: Current     Types: Chew   Tobacco comments:    1 can per week  Vaping Use   Vaping status: Never Used  Substance Use Topics   Alcohol use: Not Currently    Alcohol/week: 2.0 - 3.0 standard drinks of alcohol    Types: 1 Glasses of wine, 1 - 2 Standard drinks or equivalent per week    Comment: sometimes   Drug use: Never    ROS: Constitutional:  Negative for fever, chills, weight loss CV: Negative for chest pain, previous MI, hypertension Respiratory:  Negative for shortness of breath, wheezing, sleep apnea, frequent cough GI:  Negative for nausea, vomiting, bloody stool, GERD  Physical exam: BP (!) 176/76   Pulse 91   Ht 5\' 10"  (1.778 m)   Wt 186 lb (84.4 kg)   BMI 26.69 kg/m  GENERAL APPEARANCE:  Well appearing, well developed, well nourished, NAD HEENT:  Atraumatic, normocephalic, oropharynx clear NECK:  Supple without lymphadenopathy or thyromegaly ABDOMEN:  Soft, non-tender, no masses EXTREMITIES:  Moves all extremities well, without clubbing, cyanosis, or edema NEUROLOGIC:  Alert and oriented x 3, normal gait, CN II-XII grossly intact MENTAL STATUS:  appropriate BACK:  Non-tender to palpation, No CVAT SKIN:  Warm, dry, and intact   Results: U/A: 0-5 WBCs, 0-2 RBCs, few bacteria

## 2023-05-06 ENCOUNTER — Ambulatory Visit: Payer: Medicare HMO | Admitting: Urology

## 2023-05-07 ENCOUNTER — Other Ambulatory Visit: Payer: Self-pay | Admitting: Physician Assistant

## 2023-05-07 DIAGNOSIS — E1142 Type 2 diabetes mellitus with diabetic polyneuropathy: Secondary | ICD-10-CM

## 2023-05-11 ENCOUNTER — Encounter: Payer: Self-pay | Admitting: Acute Care

## 2023-05-11 ENCOUNTER — Ambulatory Visit: Admitting: Acute Care

## 2023-05-11 VITALS — BP 176/87 | HR 61 | Ht 70.0 in | Wt 185.8 lb

## 2023-05-11 DIAGNOSIS — Z77090 Contact with and (suspected) exposure to asbestos: Secondary | ICD-10-CM | POA: Diagnosis not present

## 2023-05-11 DIAGNOSIS — R918 Other nonspecific abnormal finding of lung field: Secondary | ICD-10-CM | POA: Diagnosis not present

## 2023-05-11 DIAGNOSIS — Z87891 Personal history of nicotine dependence: Secondary | ICD-10-CM

## 2023-05-11 NOTE — Progress Notes (Signed)
 History of Present Illness Clayton James is a 73 y.o. male former smoker , quit 2000, then resumed smoking in 2012 - 2016 , referred for abnormal chest imaging. He will be followed by Dr.Byrum    PMH  HTN DM Hyperlipidemia Osteoarthritis CKD stage 3a Bilateral Carpal tunnel Syndrome     05/11/2023 Pt. Presents for lung nodule consult. Clayton James is a 73 year old male former smoker with a history of asbestos exposure who presents for follow-up of pleural disease and kidney stones.  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 A CT chest from October 2024 showed pleural thickening, nodularity, and calcified pleural plaque formation, consistent with asbestos pleural disease. He recalls a follow-up imaging session but is unsure of the details. No respiratory symptoms such as shortness of breath, cough, or hemoptysis. He has not experienced any weight loss.  He has a history of kidney stones, initially identified through imaging at [facility] around March. He has experienced abdominal pain associated with the kidney stones in the past, but not currently. He recently underwent a renal stone protocol on April 30, 2023, which has not yet been read.  His past medical history includes hypertension, diabetes, elevated lipids, osteoarthritis, chronic kidney disease, and bilateral carpal tunnel syndrome. He has undergone two knee replacements. He has a history of smoking, having quit in 2000, but briefly resumed smoking between 2012 and 2016. He smoked half a pack per day for 32 years, resulting in a 16 pack-year history. He also used smokeless tobacco.  He lives with his wife and is retired. He has not traveled recently. History of colon cancer in a cousin. He has experienced significant personal loss, with the passing of two sons, one from a brain tumor and another from personal struggles.  We discussed that the last imaging  he had done of the chest was 10/2022. It recommended a 6 month follow up scan. We are at the 6 month mark. We will get a dedicated CT Chest and re-evaluate after it has been read. Pt.verbalized understanding and  Is in agreement with this plan.   Test Results: 10/2023 CT Chest Pleural thickening and nodularity throughout the lung bases, as well as areas of calcified pleural plaque formation bilaterally. Representative nodule in the right lung base again measuring 1.0 cm. Findings are highly suggestive of asbestos pleural disease. Consider follow-up in 3-6 months to ensure stability. 2. Cardiomegaly and coronary artery disease. 3. Hepatic steatosis. 4. Partially imaged right hydronephrosis and renal calculi better assessed by prior CT of the abdomen and pelvis.     Latest Ref Rng & Units 04/28/2023    9:06 AM 01/18/2023    8:29 AM 09/16/2022    8:33 AM  CBC  WBC 3.4 - 10.8 x10E3/uL 6.7  5.6  6.2   Hemoglobin 13.0 - 17.7 g/dL 65.7  84.6  96.2   Hematocrit 37.5 - 51.0 % 47.7  46.1  47.1   Platelets 150 - 450 x10E3/uL 334  307  364        Latest Ref Rng & Units 04/28/2023    9:06 AM 01/18/2023    8:29 AM 09/16/2022    8:33 AM  BMP  Glucose 70 - 99 mg/dL 87  70  80   BUN 8 - 27 mg/dL 20  13  17    Creatinine 0.76 - 1.27 mg/dL 9.52  8.41  3.24   BUN/Creat Ratio 10 -  24 26  20  20    Sodium 134 - 144 mmol/L 144  144  144   Potassium 3.5 - 5.2 mmol/L 4.0  4.1  4.7   Chloride 96 - 106 mmol/L 105  106  105   CO2 20 - 29 mmol/L 21  21  22    Calcium  8.6 - 10.2 mg/dL 9.9  9.7  9.9     BNP No results found for: "BNP"  ProBNP No results found for: "PROBNP"  PFT No results found for: "FEV1PRE", "FEV1POST", "FVCPRE", "FVCPOST", "TLC", "DLCOUNC", "PREFEV1FVCRT", "PSTFEV1FVCRT"  No results found.   Past medical hx Past Medical History:  Diagnosis Date   Arthritis    Benign essential hypertension 04/27/2019   Chronic kidney disease, stage 3a (HCC) 04/27/2019   Family history of  adverse reaction to anesthesia    Mothers sister had issues, but pt isn't sure what they were   Mixed hyperlipidemia 04/27/2019   Type 2 diabetes mellitus with diabetic polyneuropathy (HCC) 04/27/2019   Type 2 diabetes mellitus with stage 3 chronic kidney disease (HCC) 04/27/2019     Social History   Tobacco Use   Smoking status: Former    Current packs/day: 0.00    Average packs/day: 0.5 packs/day for 8.0 years (4.0 ttl pk-yrs)    Types: Cigarettes    Start date: 55    Quit date: 2000    Years since quitting: 25.3   Smokeless tobacco: Current    Types: Chew   Tobacco comments:    1 can per week  Vaping Use   Vaping status: Never Used  Substance Use Topics   Alcohol use: Not Currently    Alcohol/week: 2.0 - 3.0 standard drinks of alcohol    Types: 1 Glasses of wine, 1 - 2 Standard drinks or equivalent per week    Comment: sometimes   Drug use: Never    Clayton James reports that he quit smoking about 25 years ago. His smoking use included cigarettes. He started smoking about 33 years ago. He has a 4 pack-year smoking history. His smokeless tobacco use includes chew. He reports that he does not currently use alcohol after a past usage of about 2.0 - 3.0 standard drinks of alcohol per week. He reports that he does not use drugs.  Tobacco Cessation: Ready to quit: Not Answered Counseling given: Not Answered Tobacco comments: 1 can per week Former smoker quit 2016  Past surgical hx, Family hx, Social hx all reviewed.  Current Outpatient Medications on File Prior to Visit  Medication Sig   ALPRAZolam  (XANAX ) 0.5 MG tablet Take 1 tablet by mouth twice daily as needed   amLODipine -benazepril  (LOTREL) 10-20 MG capsule Take 1 capsule by mouth once daily   atorvastatin  (LIPITOR) 40 MG tablet Take 1 tablet (40 mg total) by mouth daily.   Blood Glucose Monitoring Suppl (ONE TOUCH ULTRA 2) w/Device KIT USE TO CHECK GLUCOSE THREE TIMES DAILY   FARXIGA  10 MG TABS tablet Take 1 tablet by  mouth once daily   glucose blood test strip 1 each by Other route daily. Use as instructed   Insulin  Pen Needle (BD PEN NEEDLE MICRO U/F) 32G X 6 MM MISC USE AS DIRECTED ONCE DAILY   metFORMIN  (GLUCOPHAGE ) 1000 MG tablet Take 1 tablet by mouth twice daily   SHINGRIX injection Inject 0.5 mLs into the muscle once.   tadalafil  (CIALIS ) 5 MG tablet Take 1 tablet (5 mg total) by mouth daily as needed for erectile dysfunction.   tamsulosin  (FLOMAX )  0.4 MG CAPS capsule Take 1 capsule (0.4 mg total) by mouth daily.   traMADol  (ULTRAM -ER) 100 MG 24 hr tablet Take 1 tablet by mouth once daily   TRESIBA  FLEXTOUCH 100 UNIT/ML FlexTouch Pen INJECT 30 UNITS SUBCUTANEOUSLY ONCE DAILY   No current facility-administered medications on file prior to visit.     Allergies  Allergen Reactions   Sulfamethoxazole Rash    Review Of Systems:  Constitutional:   No  weight loss, night sweats,  Fevers, chills, fatigue, or  lassitude.  HEENT:   No headaches,  Difficulty swallowing,  Tooth/dental problems, or  Sore throat,                No sneezing, itching, ear ache, nasal congestion, post nasal drip,   CV:  No chest pain,  Orthopnea, PND, swelling in lower extremities, anasarca, dizziness, palpitations, syncope.   GI  No heartburn, indigestion, abdominal pain, nausea, vomiting, diarrhea, change in bowel habits, loss of appetite, bloody stools.   Resp: No shortness of breath with exertion or at rest.  No excess mucus, no productive cough,  No non-productive cough,  No coughing up of blood.  No change in color of mucus.  No wheezing.  No chest wall deformity  Skin: no rash or lesions.  GU: no dysuria, change in color of urine, no urgency or frequency.  No flank pain, no hematuria   MS:  No joint pain or swelling.  No decreased range of motion.  No back pain.  Psych:  No change in mood or affect. No depression or anxiety.  No memory loss.   Vital Signs There were no vitals taken for this  visit.   Physical Exam:  General- No distress,  A&Ox3, pleasant ENT: No sinus tenderness, TM clear, pale nasal mucosa, no oral exudate,no post nasal drip, no LAN Cardiac: S1, S2, regular rate and rhythm, no murmur Chest: No wheeze/ rales/ dullness; no accessory muscle use, no nasal flaring, no sternal retractions Abd.: Soft Non-tender, ND, BS +, Body mass index is 26.66 kg/m.  Ext: No clubbing cyanosis, edema, no obvious deformities Neuro:  normal strength, MAE x 4, A&O x 3, appropriate Skin: No rashes, warm and dry, no obvious lesions  Psych: normal mood and behavior   Assessment/Plan Lung nodules in patient who is former smoker and has previous exposure to asbestos Plan We will order a dedicated CT Chest to better evaluate the lung nodules noted on the 10/2022 Ct Chest.  This will be done at [facility].  You will get a call to get this scheduled.  Follow up with me after, you will get a call to get this scheduled.  Call if you need us  sooner.  Please contact office for sooner follow up if symptoms do not improve or worsen or seek emergency care     I spent 20 minutes dedicated to the care of this patient on the date of this encounter to include pre-visit review of records, face-to-face time with the patient discussing conditions above, post visit ordering of testing, clinical documentation with the electronic health record, making appropriate referrals as documented, and communicating necessary information to the patient's healthcare team.    Raejean Bullock, NP 05/11/2023  10:10 AM

## 2023-05-11 NOTE — Patient Instructions (Addendum)
 It is good to see you today.  We will order a dedicated CT Chest to better evaluate the lung nodules noted on the 10/2022 Ct Chest.  This will be done at [facility].  You will get a call to get this scheduled.  Follow up with me after, you will get a call to get this scheduled.  Call if you need us  sooner.  Please contact office for sooner follow up if symptoms do not improve or worsen or seek emergency care

## 2023-05-16 ENCOUNTER — Other Ambulatory Visit: Payer: Self-pay | Admitting: Family Medicine

## 2023-05-17 ENCOUNTER — Encounter: Payer: Self-pay | Admitting: Physician Assistant

## 2023-05-17 ENCOUNTER — Other Ambulatory Visit: Payer: Self-pay

## 2023-05-17 DIAGNOSIS — I10 Essential (primary) hypertension: Secondary | ICD-10-CM

## 2023-05-17 MED ORDER — AMLODIPINE BESY-BENAZEPRIL HCL 10-20 MG PO CAPS: 1.0000 | ORAL_CAPSULE | Freq: Every day | ORAL | 0 refills | Status: DC

## 2023-05-17 NOTE — Telephone Encounter (Signed)
 Refill sent to pharmacy.

## 2023-05-18 ENCOUNTER — Other Ambulatory Visit: Payer: Self-pay

## 2023-05-18 DIAGNOSIS — M19011 Primary osteoarthritis, right shoulder: Secondary | ICD-10-CM

## 2023-05-18 DIAGNOSIS — F419 Anxiety disorder, unspecified: Secondary | ICD-10-CM

## 2023-05-19 ENCOUNTER — Ambulatory Visit (INDEPENDENT_AMBULATORY_CARE_PROVIDER_SITE_OTHER)
Admission: RE | Admit: 2023-05-19 | Discharge: 2023-05-19 | Disposition: A | Discharge status: Home / Self Care | Source: Ambulatory Visit | Attending: Acute Care | Admitting: Acute Care

## 2023-05-19 DIAGNOSIS — R918 Other nonspecific abnormal finding of lung field: Secondary | ICD-10-CM

## 2023-05-19 DIAGNOSIS — D367 Benign neoplasm of other specified sites: Secondary | ICD-10-CM | POA: Diagnosis not present

## 2023-05-19 DIAGNOSIS — D3502 Benign neoplasm of left adrenal gland: Secondary | ICD-10-CM | POA: Diagnosis not present

## 2023-05-21 ENCOUNTER — Other Ambulatory Visit: Payer: Self-pay

## 2023-05-21 DIAGNOSIS — F419 Anxiety disorder, unspecified: Secondary | ICD-10-CM

## 2023-06-08 ENCOUNTER — Ambulatory Visit: Admitting: Acute Care

## 2023-06-10 ENCOUNTER — Encounter: Payer: Self-pay | Admitting: Physician Assistant

## 2023-06-10 ENCOUNTER — Other Ambulatory Visit: Payer: Self-pay | Admitting: Urology

## 2023-06-10 ENCOUNTER — Ambulatory Visit (INDEPENDENT_AMBULATORY_CARE_PROVIDER_SITE_OTHER): Admitting: Physician Assistant

## 2023-06-10 VITALS — BP 132/68 | HR 68 | Temp 97.3°F | Ht 70.0 in | Wt 188.0 lb

## 2023-06-10 DIAGNOSIS — I10 Essential (primary) hypertension: Secondary | ICD-10-CM

## 2023-06-10 DIAGNOSIS — N529 Male erectile dysfunction, unspecified: Secondary | ICD-10-CM

## 2023-06-10 DIAGNOSIS — S46011S Strain of muscle(s) and tendon(s) of the rotator cuff of right shoulder, sequela: Secondary | ICD-10-CM | POA: Diagnosis not present

## 2023-06-10 DIAGNOSIS — M19011 Primary osteoarthritis, right shoulder: Secondary | ICD-10-CM | POA: Diagnosis not present

## 2023-06-10 MED ORDER — ACETAMINOPHEN 325 MG PO TABS: 650.0000 mg | ORAL_TABLET | Freq: Four times a day (QID) | ORAL | 3 refills | Status: AC | PRN

## 2023-06-10 MED ORDER — TRAMADOL HCL 50 MG PO TABS: 100.0000 mg | ORAL_TABLET | Freq: Two times a day (BID) | ORAL | 0 refills | Status: DC

## 2023-06-10 NOTE — Progress Notes (Signed)
Acute Office Visit  Subjective:    Patient ID: Clayton James, male    DOB: 01-Nov-1950, 73 y.o.   MRN: [id number]  Chief Complaint  Patient presents with   Joint pain    HPI: Patient is in today for worsening joint pain  Discussed the use of AI scribe software for clinical note transcription with the patient, who gave verbal consent to proceed.  History of Present Illness   Clayton James is a 73 year old male who presents with worsening joint pain.  He experiences worsening joint pain, particularly in his shoulders and hands, exacerbated by physical activities such as using a chainsaw, weed whacking, and fishing. By the end of the day, his shoulders are particularly painful, impacting his ability to perform daily tasks such as reaching for a seatbelt.  He has a history of a work accident that resulted in a torn rotator cuff in his right shoulder, which is his dominant side. This injury involved nerve damage and a tear, which he manages with medication. He has been using tramadol , initially finding it effective, but now reports that its effects wear off by the afternoon, around 3 PM. He also uses ibuprofen, taking four to five tablets as needed, which he finds helpful.  His sleep is significantly affected by the pain, with difficulty falling and staying asleep. He often wakes up at 3:30 AM and struggles to return to sleep, sometimes moving to the couch to avoid disturbing his wife. He uses a heat pad for relief but finds it insufficient. He also takes Xanax  to help with sleep, as his mind tends to remain active at night.  He maintains an active lifestyle, taking care of his property and engaging in activities like fishing and gardening. Despite the pain, he continues to perform these activities, wanting to remain active and productive.  He has a history of hypertension and has had knee surgeries. He reports that his 'sugar numbers are good' and does not mention any other significant  medical issues. No daytime sleepiness from his medications and no new symptoms aside from the worsening joint pain.       Past Medical History:  Diagnosis Date   Arthritis    Benign essential hypertension 04/27/2019   Chronic kidney disease, stage 3a (HCC) 04/27/2019   Family history of adverse reaction to anesthesia    Mothers sister had issues, but pt isn't sure what they were   Mixed hyperlipidemia 04/27/2019   Type 2 diabetes mellitus with diabetic polyneuropathy (HCC) 04/27/2019   Type 2 diabetes mellitus with stage 3 chronic kidney disease (HCC) 04/27/2019    Past Surgical History:  Procedure Laterality Date   COLONOSCOPY     ELBOW SURGERY Left 1994   KNEE ARTHROSCOPY Right 1998   KNEE CARTILAGE SURGERY Left 1994   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   Procedure: RIGHT TOTAL KNEE ARTHROPLASTY;  Surgeon: Wes Hamman, MD;  Location: [facility] OR;  Service: Orthopedics;  Laterality: Right;   TOTAL KNEE ARTHROPLASTY Left 04/06/2022   Procedure: LEFT TOTAL KNEE ARTHROPLASTY;  Surgeon: Wes Hamman, MD;  Location: [facility] OR;  Service: Orthopedics;  Laterality: Left;    Family History  Problem Relation Age of Onset   Diabetes Mother    Kidney disease Mother    Heart disease Father     Social History   Socioeconomic History   Marital status: Married    Spouse name: Jacqualyn   Number of children: 3   Years of education:  Not on file   Highest education level: Not on file  Occupational History   Not on file  Tobacco Use   Smoking status: Former    Current packs/day: 0.00    Average packs/day: 0.5 packs/day for 8.0 years (4.0 ttl pk-yrs)    Types: Cigarettes    Start date: 49    Quit date: 2000    Years since quitting: 25.4   Smokeless tobacco: Current    Types: Chew   Tobacco comments:    1 can per week  Vaping Use   Vaping status: Never Used  Substance and Sexual Activity   Alcohol use: Not Currently    Alcohol/week: 2.0 - 3.0 standard drinks of alcohol     Types: 1 Glasses of wine, 1 - 2 Standard drinks or equivalent per week    Comment: sometimes   Drug use: Never   Sexual activity: Yes    Partners: Female  Other Topics Concern   Not on file  Social History Narrative   Oldest son and youngest son passed away   Social Drivers of Health   Financial Resource Strain: Low Risk  (12/22/2022)   Overall Financial Resource Strain (CARDIA)    Difficulty of Paying Living Expenses: Not hard at all  Food Insecurity: No Food Insecurity (12/22/2022)   Hunger Vital Sign    Worried About Running Out of Food in the Last Year: Never true    Ran Out of Food in the Last Year: Never true  Transportation Needs: No Transportation Needs (12/22/2022)   PRAPARE - Administrator, Civil Service (Medical): No    Lack of Transportation (Non-Medical): No  Physical Activity: Insufficiently Active (12/22/2022)   Exercise Vital Sign    Days of Exercise per Week: 3 days    Minutes of Exercise per Session: 30 min  Stress: No Stress Concern Present (12/22/2022)   [employer] of Occupational Health - Occupational Stress Questionnaire    Feeling of Stress : Not at all  Social Connections: Moderately Isolated (12/22/2022)   Social Connection and Isolation Panel [NHANES]    Frequency of Communication with Friends and Family: More than three times a week    Frequency of Social Gatherings with Friends and Family: More than three times a week    Attends Religious Services: Never    Database administrator or Organizations: No    Attends Banker Meetings: Never    Marital Status: Married  Catering manager Violence: Not At Risk (12/22/2022)   Humiliation, Afraid, Rape, and Kick questionnaire    Fear of Current or Ex-Partner: No    Emotionally Abused: No    Physically Abused: No    Sexually Abused: No    Outpatient Medications Prior to Visit  Medication Sig Dispense Refill   ALPRAZolam  (XANAX ) 0.5 MG tablet Take 1 tablet by mouth twice daily as  needed 60 tablet 0   amLODipine -benazepril  (LOTREL) 10-20 MG capsule Take 1 capsule by mouth daily. 90 capsule 0   atorvastatin  (LIPITOR) 40 MG tablet Take 1 tablet (40 mg total) by mouth daily. 90 tablet 2   Blood Glucose Monitoring Suppl (ONE TOUCH ULTRA 2) w/Device KIT USE TO CHECK GLUCOSE THREE TIMES DAILY     FARXIGA  10 MG TABS tablet Take 1 tablet by mouth once daily 90 tablet 0   glucose blood test strip 1 each by Other route daily. Use as instructed 100 each 2   Insulin  Pen Needle (BD PEN NEEDLE MICRO U/F) 32G  X 6 MM MISC USE AS DIRECTED ONCE DAILY 100 each 3   metFORMIN  (GLUCOPHAGE ) 1000 MG tablet Take 1 tablet by mouth twice daily 180 tablet 1   SHINGRIX injection Inject 0.5 mLs into the muscle once.     tadalafil  (CIALIS ) 5 MG tablet Take 1 tablet (5 mg total) by mouth daily as needed for erectile dysfunction. 30 tablet 0   tamsulosin  (FLOMAX ) 0.4 MG CAPS capsule Take 1 capsule (0.4 mg total) by mouth daily. 90 capsule 3   TRESIBA  FLEXTOUCH 100 UNIT/ML FlexTouch Pen INJECT 30 UNITS SUBCUTANEOUSLY ONCE DAILY 15 mL 0   traMADol  (ULTRAM -ER) 100 MG 24 hr tablet Take 1 tablet by mouth once daily 30 tablet 0   No facility-administered medications prior to visit.    Allergies  Allergen Reactions   Sulfamethoxazole Rash    Review of Systems  Constitutional:  Negative for appetite change, fatigue and fever.  HENT:  Negative for congestion, ear pain, sinus pressure and sore throat.   Respiratory:  Negative for cough, chest tightness, shortness of breath and wheezing.   Cardiovascular:  Negative for chest pain and palpitations.  Gastrointestinal:  Negative for abdominal pain, constipation, diarrhea, nausea and vomiting.  Genitourinary:  Negative for dysuria and hematuria.  Musculoskeletal:  Positive for myalgias (Shoulders/hands pain). Negative for arthralgias, back pain and joint swelling.  Skin:  Negative for rash.  Neurological:  Negative for dizziness, weakness and headaches.   Psychiatric/Behavioral:  Negative for dysphoric mood. The patient is not nervous/anxious.        Objective:         06/10/2023   10:02 AM 05/11/2023   10:19 AM 05/05/2023   11:28 AM  Vitals with BMI  Height 5\' 10"  5\' 10"  5\' 10"   Weight 188 lbs 185 lbs 13 oz 186 lbs  BMI 26.98 26.66 26.69  Systolic 132 176 098  Diastolic 68 87 76  Pulse 68 61 91    Orthostatic VS for the past 72 hrs (Last 3 readings):  Patient Position BP Location  06/10/23 1002 Sitting Left Arm     Physical Exam Vitals reviewed.  Constitutional:      Appearance: Normal appearance.  Cardiovascular:     Rate and Rhythm: Normal rate and regular rhythm.     Heart sounds: Normal heart sounds.  Pulmonary:     Effort: Pulmonary effort is normal.     Breath sounds: Normal breath sounds.  Abdominal:     General: Bowel sounds are normal.     Palpations: Abdomen is soft.     Tenderness: There is no abdominal tenderness.  Musculoskeletal:     Right shoulder: Tenderness present. No crepitus. Decreased range of motion. Normal strength. Normal pulse.     Left shoulder: Tenderness present. No crepitus. Decreased range of motion. Normal strength. Normal pulse.     Right hand: Swelling, deformity and tenderness present. Normal strength.     Left hand: Swelling, deformity and tenderness present. Normal strength.  Neurological:     Mental Status: He is alert and oriented to person, place, and time.  Psychiatric:        Mood and Affect: Mood normal.        Behavior: Behavior normal.     Health Maintenance Due  Topic Date Due   OPHTHALMOLOGY EXAM  07/04/2021    There are no preventive care reminders to display for this patient.   No results found for: "TSH" Lab Results  Component Value Date   WBC 6.7 04/28/2023  HGB 16.1 04/28/2023   HCT 47.7 04/28/2023   MCV 93 04/28/2023   PLT 334 04/28/2023   Lab Results  Component Value Date   NA 144 04/28/2023   K 4.0 04/28/2023   CO2 21 04/28/2023   GLUCOSE  87 04/28/2023   BUN 20 04/28/2023   CREATININE 0.76 04/28/2023   BILITOT 0.5 04/28/2023   ALKPHOS 78 04/28/2023   AST 12 04/28/2023   ALT 11 04/28/2023   PROT 7.0 04/28/2023   ALBUMIN 4.6 04/28/2023   CALCIUM  9.9 04/28/2023   ANIONGAP 10 04/01/2022   EGFR 95 04/28/2023   Lab Results  Component Value Date   CHOL 155 04/28/2023   Lab Results  Component Value Date   HDL 75 04/28/2023   Lab Results  Component Value Date   LDLCALC 69 04/28/2023   Lab Results  Component Value Date   TRIG 51 04/28/2023   Lab Results  Component Value Date   CHOLHDL 2.1 04/28/2023   Lab Results  Component Value Date   HGBA1C 6.3 (H) 04/28/2023   Total time spent on today's visit was greater than 30 minutes, including both face-to-face time and nonface-to-face time personally spent on review of chart (labs and imaging), discussing labs and goals, discussing further work-up, treatment options, referrals to specialist if needed, reviewing outside records of pertinent, answering patient's questions, and coordinating care.     Assessment & Plan:  Primary osteoarthritis of right shoulder Assessment & Plan: Chronic joint pain, particularly in shoulders, disrupts sleep and activities. Current tramadol  regimen insufficient. - Prescribe short-acting tramadol  50 mg, one tablet morning and afternoon. Increase to two tablets if needed. - Prescribe acetaminophen  325 mg at night. - Advise alternating heat and ice application.  Orders: -     traMADol  HCl; Take 2 tablets (100 mg total) by mouth 2 (two) times daily.  Dispense: 120 tablet; Refill: 0 -     Acetaminophen ; Take 2 tablets (650 mg total) by mouth every 6 (six) hours as needed for moderate pain (pain score 4-6).  Dispense: 60 tablet; Refill: 3  Benign essential hypertension Assessment & Plan: Hypertension stable with current management. BP Readings from Last 3 Encounters:  06/10/23 132/68  05/11/23 (!) 176/87  05/05/23 (!) 176/76       Traumatic complete tear of right rotator cuff, sequela Assessment & Plan: Chronic pain from previous tear and injury, worsened by activity. - Manage with tramadol  and acetaminophen  as per arthritis plan.       Meds ordered this encounter  Medications   traMADol  (ULTRAM ) 50 MG tablet    Sig: Take 2 tablets (100 mg total) by mouth 2 (two) times daily.    Dispense:  120 tablet    Refill:  0   acetaminophen  (TYLENOL ) 325 MG tablet    Sig: Take 2 tablets (650 mg total) by mouth every 6 (six) hours as needed for moderate pain (pain score 4-6).    Dispense:  60 tablet    Refill:  3    No orders of the defined types were placed in this encounter.    Follow-up: Return if symptoms worsen or fail to improve, for [facility].  An After Visit Summary was printed and given to the patient.    I,Lauren M Auman,acting as a Neurosurgeon for [employer], [employer].,have documented all relevant documentation on the behalf of Odilia Bennett, PA,as directed by  Odilia Bennett, PA while in the presence of Odilia Bennett, [place].   Odilia Bennett, PA [facility] [phone]  [phone] 

## 2023-06-10 NOTE — Assessment & Plan Note (Signed)
 Chronic joint pain, particularly in shoulders, disrupts sleep and activities. Current tramadol  regimen insufficient. - Prescribe short-acting tramadol  50 mg, one tablet morning and afternoon. Increase to two tablets if needed. - Prescribe acetaminophen  325 mg at night. - Advise alternating heat and ice application.

## 2023-06-10 NOTE — Patient Instructions (Signed)
 VISIT SUMMARY:  Today, we discussed your worsening joint pain, particularly in your shoulders and hands, which is affecting your daily activities and sleep. We also reviewed your history of a torn rotator cuff in your right shoulder and your current management for hypertension.  YOUR PLAN:  -ARTHRITIS: Arthritis is a condition that causes inflammation and pain in the joints. To help manage your chronic joint pain, particularly in your shoulders, we are prescribing short-acting tramadol  50 mg to be taken in the morning and afternoon, with the option to increase to two tablets if needed. Additionally, you should take acetaminophen  500 mg at night and alternate between heat and ice applications for relief.  -RIGHT SHOULDER PAIN DUE TO ROTATOR CUFF TEAR: Your right shoulder pain is due to a previous rotator cuff tear and injury, which is worsened by physical activity. We will manage this pain with the same tramadol  and acetaminophen  regimen as for your arthritis.  -HYPERTENSION: Hypertension, or high blood pressure, is being managed well with your current treatment plan. No changes are needed at this time.  -GOALS OF CARE: You expressed a desire to remain active and maintain your independence and quality of life. We will continue to support these goals through your treatment plan.  INSTRUCTIONS:  Please follow the new medication regimen as discussed: take tramadol  50 mg in the morning and afternoon, with the option to increase to two tablets if needed, and acetaminophen  500 mg at night. Alternate between heat and ice applications for your joint pain. Continue with your current hypertension management. If you have any concerns or if your symptoms worsen, please schedule a follow-up appointment.

## 2023-06-10 NOTE — Assessment & Plan Note (Addendum)
 Hypertension stable with current management. BP Readings from Last 3 Encounters:  06/10/23 132/68  05/11/23 (!) 176/87  05/05/23 (!) 176/76

## 2023-06-10 NOTE — Assessment & Plan Note (Deleted)
 Chronic joint pain, particularly in shoulders, disrupts sleep and activities. Current tramadol  regimen insufficient. - Prescribe short-acting tramadol  50 mg, one tablet morning and afternoon. Increase to two tablets if needed. - Prescribe acetaminophen  500 mg at night. - Advise alternating heat and ice application.

## 2023-06-10 NOTE — Assessment & Plan Note (Signed)
 Chronic pain from previous tear and injury, worsened by activity. - Manage with tramadol  and acetaminophen  as per arthritis plan.

## 2023-06-16 ENCOUNTER — Ambulatory Visit: Admitting: Urology

## 2023-06-17 ENCOUNTER — Other Ambulatory Visit: Payer: Self-pay | Admitting: Physician Assistant

## 2023-06-17 ENCOUNTER — Other Ambulatory Visit: Payer: Self-pay

## 2023-06-17 DIAGNOSIS — F419 Anxiety disorder, unspecified: Secondary | ICD-10-CM

## 2023-06-17 DIAGNOSIS — N529 Male erectile dysfunction, unspecified: Secondary | ICD-10-CM

## 2023-06-17 MED ORDER — TADALAFIL 5 MG PO TABS: 5.0000 mg | ORAL_TABLET | Freq: Every day | ORAL | 0 refills | Status: DC | PRN

## 2023-06-26 ENCOUNTER — Other Ambulatory Visit: Payer: Self-pay | Admitting: Physician Assistant

## 2023-06-26 DIAGNOSIS — E1142 Type 2 diabetes mellitus with diabetic polyneuropathy: Secondary | ICD-10-CM

## 2023-07-01 LAB — URINALYSIS, ROUTINE W REFLEX MICROSCOPIC
Bilirubin, UA: NEGATIVE
Ketones, UA: NEGATIVE
Leukocytes,UA: NEGATIVE
Nitrite, UA: NEGATIVE
RBC, UA: NEGATIVE
Specific Gravity, UA: 1.015 (ref 1.005–1.030)
Urobilinogen, Ur: 1 mg/dL (ref 0.2–1.0)
pH, UA: 5.5 (ref 5.0–7.5)

## 2023-07-01 LAB — MICROSCOPIC EXAMINATION

## 2023-07-07 ENCOUNTER — Other Ambulatory Visit: Payer: Self-pay | Admitting: Physician Assistant

## 2023-07-07 DIAGNOSIS — M19011 Primary osteoarthritis, right shoulder: Secondary | ICD-10-CM

## 2023-07-09 ENCOUNTER — Other Ambulatory Visit: Payer: Self-pay | Admitting: Physician Assistant

## 2023-07-09 DIAGNOSIS — E782 Mixed hyperlipidemia: Secondary | ICD-10-CM

## 2023-07-15 ENCOUNTER — Ambulatory Visit: Admitting: Urology

## 2023-07-15 ENCOUNTER — Encounter: Payer: Self-pay | Admitting: Urology

## 2023-07-15 VITALS — BP 175/91 | HR 91 | Ht 70.0 in | Wt 185.0 lb

## 2023-07-15 DIAGNOSIS — N138 Other obstructive and reflux uropathy: Secondary | ICD-10-CM | POA: Diagnosis not present

## 2023-07-15 DIAGNOSIS — N200 Calculus of kidney: Secondary | ICD-10-CM

## 2023-07-15 DIAGNOSIS — Q631 Lobulated, fused and horseshoe kidney: Secondary | ICD-10-CM

## 2023-07-15 DIAGNOSIS — N529 Male erectile dysfunction, unspecified: Secondary | ICD-10-CM | POA: Diagnosis not present

## 2023-07-15 DIAGNOSIS — N401 Enlarged prostate with lower urinary tract symptoms: Secondary | ICD-10-CM

## 2023-07-15 LAB — URINALYSIS, ROUTINE W REFLEX MICROSCOPIC
Bilirubin, UA: NEGATIVE
Ketones, UA: NEGATIVE
Leukocytes,UA: NEGATIVE
Nitrite, UA: NEGATIVE
Protein,UA: NEGATIVE
RBC, UA: NEGATIVE
Specific Gravity, UA: 1.015 (ref 1.005–1.030)
Urobilinogen, Ur: 1 mg/dL (ref 0.2–1.0)
pH, UA: 6 (ref 5.0–7.5)

## 2023-07-15 MED ORDER — TADALAFIL 5 MG PO TABS: 5.0000 mg | ORAL_TABLET | Freq: Every day | ORAL | 3 refills | Status: AC

## 2023-07-15 NOTE — Progress Notes (Signed)
 Assessment: 1. Nephrolithiasis   2. Horseshoe kidney   3. BPH with obstruction/lower urinary tract symptoms   4. Organic impotence    Plan: Continue stone prevention  Continue tamsulosin  0.4 mg daily for BPH with LUTS Continue tadalafil  5 mg daily.  Refill sent. Return to office in 6 months.  Chief Complaint:  Chief Complaint  Patient presents with   Nephrolithiasis    History of Present Illness:  Clayton James is a 73 y.o. male who is seen for continued evaluation of nephrolithiasis and horseshoe kidney. He underwent evaluation with CT imaging for evaluation of left lower quadrant pain in 9/24.  The pain lasted approximately 4-5 days and resolved spontaneously.  No further symptoms.   CT from 10/07/2022 showed a horseshoe kidney with multiple right renal calculi with the largest stone measuring 6 mm in size, mild dilation of the right renal pelvis and mild dilation of the right renal calyces consistent with a chronic right UPJ obstruction, mild dilation of the left renal pelvis. No prior history of kidney stones.  He has a history of lower urinary tract symptoms including frequency, nocturia, intermittent stream, hesitancy.  No dysuria or gross hematuria.  He has been on tamsulosin  for management of his symptoms with improvement. IPSS = 13.  PSA 2/24: 2.2 PSA 2/25: 3.1  At his visit in April 2025, he had a recurrence of his left back and lower abdominal pain, severe at times.  He was taking tramadol  for the pain intermittently.  No fevers, chills, nausea, or vomiting.  No dysuria or gross hematuria.   CT scan from 04/30/2023 showed a horseshoe kidney with right sided nonobstructing nephrolithiasis. IPSS = 3.  He returns today for follow-up.  He reports good results with the tadalafil  5 mg daily.  He was able to achieve a good erection and maintain it.  His lower urinary tract symptoms are stable.  He continues on tamsulosin  0.4 mg daily.  No dysuria or gross hematuria.  He has  not had any recent kidney stone symptoms. IPSS = 3/1.  Portions of the above documentation were copied from a prior visit for review purposes only.   Past Medical History:  Past Medical History:  Diagnosis Date   Arthritis    Benign essential hypertension 04/27/2019   Chronic kidney disease, stage 3a (HCC) 04/27/2019   Family history of adverse reaction to anesthesia    Mothers sister had issues, but pt isn't sure what they were   Mixed hyperlipidemia 04/27/2019   Type 2 diabetes mellitus with diabetic polyneuropathy (HCC) 04/27/2019   Type 2 diabetes mellitus with stage 3 chronic kidney disease (HCC) 04/27/2019    Past Surgical History:  Past Surgical History:  Procedure Laterality Date   COLONOSCOPY     ELBOW SURGERY Left 1994   KNEE ARTHROSCOPY Right 1998   KNEE CARTILAGE SURGERY Left 1994   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   Procedure: RIGHT TOTAL KNEE ARTHROPLASTY;  Surgeon: Jerri Kay CHRISTELLA, MD;  Location: [facility] OR;  Service: Orthopedics;  Laterality: Right;   TOTAL KNEE ARTHROPLASTY Left 04/06/2022   Procedure: LEFT TOTAL KNEE ARTHROPLASTY;  Surgeon: Jerri Kay CHRISTELLA, MD;  Location: [facility] OR;  Service: Orthopedics;  Laterality: Left;    Allergies:  Allergies  Allergen Reactions   Sulfamethoxazole Rash    Family History:  Family History  Problem Relation Age of Onset   Diabetes Mother    Kidney disease Mother    Heart disease Father     Social History:  Social History   Tobacco Use   Smoking status: Former    Current packs/day: 0.00    Average packs/day: 0.5 packs/day for 8.0 years (4.0 ttl pk-yrs)    Types: Cigarettes    Start date: 48    Quit date: 2000    Years since quitting: 25.5   Smokeless tobacco: Current    Types: Chew   Tobacco comments:    1 can per week  Vaping Use   Vaping status: Never Used  Substance Use Topics   Alcohol use: Not Currently    Alcohol/week: 2.0 - 3.0 standard drinks of alcohol    Types: 1 Glasses of wine, 1 - 2  Standard drinks or equivalent per week    Comment: sometimes   Drug use: Never    ROS: Constitutional:  Negative for fever, chills, weight loss CV: Negative for chest pain, previous MI, hypertension Respiratory:  Negative for shortness of breath, wheezing, sleep apnea, frequent cough GI:  Negative for nausea, vomiting, bloody stool, GERD  Physical exam: BP (!) 175/91   Pulse 91   Ht 5' 10 (1.778 m)   Wt 185 lb (83.9 kg)   BMI 26.54 kg/m  GENERAL APPEARANCE:  Well appearing, well developed, well nourished, NAD HEENT:  Atraumatic, normocephalic, oropharynx clear NECK:  Supple without lymphadenopathy or thyromegaly ABDOMEN:  Soft, non-tender, no masses EXTREMITIES:  Moves all extremities well, without clubbing, cyanosis, or edema NEUROLOGIC:  Alert and oriented x 3, normal gait, CN II-XII grossly intact MENTAL STATUS:  appropriate BACK:  Non-tender to palpation, No CVAT SKIN:  Warm, dry, and intact   Results: U/A: 0-5 WBCs, 0-2 RBCs

## 2023-07-19 ENCOUNTER — Other Ambulatory Visit: Payer: Self-pay | Admitting: Physician Assistant

## 2023-07-19 DIAGNOSIS — F419 Anxiety disorder, unspecified: Secondary | ICD-10-CM

## 2023-07-29 ENCOUNTER — Ambulatory Visit (INDEPENDENT_AMBULATORY_CARE_PROVIDER_SITE_OTHER): Admitting: Physician Assistant

## 2023-07-29 ENCOUNTER — Encounter: Payer: Self-pay | Admitting: Physician Assistant

## 2023-07-29 VITALS — BP 116/68 | HR 74 | Temp 97.7°F | Ht 70.0 in | Wt 187.6 lb

## 2023-07-29 DIAGNOSIS — M19011 Primary osteoarthritis, right shoulder: Secondary | ICD-10-CM | POA: Diagnosis not present

## 2023-07-29 DIAGNOSIS — E1142 Type 2 diabetes mellitus with diabetic polyneuropathy: Secondary | ICD-10-CM

## 2023-07-29 DIAGNOSIS — M545 Low back pain, unspecified: Secondary | ICD-10-CM | POA: Diagnosis not present

## 2023-07-29 DIAGNOSIS — I10 Essential (primary) hypertension: Secondary | ICD-10-CM | POA: Diagnosis not present

## 2023-07-29 DIAGNOSIS — M67431 Ganglion, right wrist: Secondary | ICD-10-CM | POA: Insufficient documentation

## 2023-07-29 DIAGNOSIS — E782 Mixed hyperlipidemia: Secondary | ICD-10-CM

## 2023-07-29 DIAGNOSIS — Z794 Long term (current) use of insulin: Secondary | ICD-10-CM | POA: Diagnosis not present

## 2023-07-29 MED ORDER — TRAMADOL HCL ER 200 MG PO TB24
200.0000 mg | ORAL_TABLET | Freq: Every day | ORAL | 0 refills | Status: DC
Start: 2023-07-29 — End: 2023-08-31

## 2023-07-29 NOTE — Patient Instructions (Signed)
 VISIT SUMMARY:  You came in today for a follow-up on your chronic shoulder and hand pain. We discussed your ongoing pain, current treatments, and potential new strategies to help manage your symptoms.  YOUR PLAN:  -CHRONIC SHOULDER AND HAND PAIN: Chronic pain in your shoulders and hands that has not been adequately relieved by your current medication. We discussed the risks and benefits of shoulder replacement surgery and suggested physical therapy to improve strength and mobility. You will switch to 200 mg extended-release tramadol  once daily and start physical therapy for your hands and shoulders.  -GANGLION CYST: A ganglion cyst in your hand is causing pain but does not limit your motion. We will continue to monitor this.  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 It is not currently causing any problems, but you should be aware of the risks associated with asbestos.  -GENERAL HEALTH MAINTENANCE: You are maintaining good joint mobility and have good control of your blood sugar and blood pressure. We will order blood tests to check your cholesterol and kidney function.  INSTRUCTIONS:  Please follow up with physical therapy as recommended and take the new extended-release tramadol  as prescribed. We will also need to review your blood test results once they are available.

## 2023-07-29 NOTE — Assessment & Plan Note (Signed)
 Hand ganglion cyst causing pain without limiting motion. Continue to monitor Will consider draining it or referring to a hand surgeon to remove it.

## 2023-07-29 NOTE — Progress Notes (Signed)
 Subjective:  Patient ID: Clayton James, male    DOB: 07-03-1950  Age: 73 y.o. MRN: [id number]  Chief Complaint  Patient presents with   Medical Management of Chronic Issues    HPI:  Discussed the use of AI scribe software for clinical note transcription with the patient, who gave verbal consent to proceed.  History of Present Illness   The patient is a 73 year old who presents for follow-up of chronic shoulder and hand pain.  They experience persistent and severe pain in their shoulders and hands, described as a constant ache that worsens throughout the day. The pain varies in intensity, sometimes more pronounced in the shoulders, other times in the hands, and occasionally in both areas simultaneously. This pain significantly impacts their daily activities, making tasks like changing a flat tire, carrying a case of water, and lifting laundry baskets challenging. They also have difficulty with overhead reaching and reaching behind their back.  They have been taking tramadol , 200 mg, twice daily, with the first dose at 6 AM and the second dose around 2 or 3 PM. While the medication provides some relief, it does not significantly alleviate the pain. They also use a heating pad for their shoulders, which offers some comfort but is not entirely effective.  They describe a history of physical activity, including housework and gardening, but note a reduction in activities like fishing due to pain. Despite the discomfort, they want to remain active, stating, 'I want to do things.'  They mention a past treatment with flexigenics, where a gel was injected into their joints, but it only added pressure without significant relief. 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  In terms of social history, they are active in hobbies such as collecting sports  cards and enjoy outdoor activities like hunting, which they find mentally stimulating. They express concern about the impact of their pain on their ability to engage in these activities. No significant decrease in range of motion in the wrists despite pain.          07/29/2023    7:45 AM 04/28/2023    8:19 AM 01/18/2023    7:33 AM 12/22/2022   10:23 AM 09/16/2022    7:29 AM  Depression screen PHQ 2/9  Decreased Interest 0 0 2 0 0  Down, Depressed, Hopeless 0 0 0 0 0  PHQ - 2 Score 0 0 2 0 0  Altered sleeping   1 0   Tired, decreased energy   1 0   Change in appetite   0 0   Feeling bad or failure about yourself    0 0   Trouble concentrating   0 0   Moving slowly or fidgety/restless   0 0   Suicidal thoughts   0 0   PHQ-9 Score   4 0   Difficult doing work/chores   Not difficult at all Not difficult at all         07/29/2023    7:45 AM  Fall Risk   Falls in the past year? 0  Number falls in past yr: 0  Injury with Fall? 0  Risk for fall due to : No Fall Risks    Patient Care Team: Milon Cleaves, [place] as PCP - General (Physician Assistant) Nyle Rankin POUR, [facility] (Inactive) (Pharmacist)   Review  of Systems  Constitutional:  Negative for chills, fatigue and fever.  HENT:  Negative for congestion, ear pain, sinus pressure and sore throat.   Respiratory:  Negative for cough and shortness of breath.   Cardiovascular:  Negative for chest pain.  Gastrointestinal:  Negative for abdominal pain, constipation, diarrhea, nausea and vomiting.  Genitourinary:  Negative for dysuria and frequency.  Musculoskeletal:  Negative for arthralgias, back pain and myalgias.  Neurological:  Negative for dizziness and headaches.  Psychiatric/Behavioral:  Negative for dysphoric mood. The patient is not nervous/anxious.     Current Outpatient Medications on File Prior to Visit  Medication Sig Dispense Refill   acetaminophen  (TYLENOL ) 325 MG tablet Take 2 tablets (650 mg total) by mouth every 6 (six)  hours as needed for moderate pain (pain score 4-6). 60 tablet 3   ALPRAZolam  (XANAX ) 0.5 MG tablet Take 1 tablet by mouth twice daily as needed 60 tablet 0   amLODipine -benazepril  (LOTREL) 10-20 MG capsule Take 1 capsule by mouth daily. 90 capsule 0   atorvastatin  (LIPITOR) 40 MG tablet Take 1 tablet by mouth once daily 90 tablet 0   Blood Glucose Monitoring Suppl (ONE TOUCH ULTRA 2) w/Device KIT USE TO CHECK GLUCOSE THREE TIMES DAILY     FARXIGA  10 MG TABS tablet Take 1 tablet by mouth once daily 90 tablet 0   glucose blood test strip 1 each by Other route daily. Use as instructed 100 each 2   Insulin  Pen Needle (BD PEN NEEDLE MICRO U/F) 32G X 6 MM MISC USE AS DIRECTED ONCE DAILY 100 each 3   metFORMIN  (GLUCOPHAGE ) 1000 MG tablet Take 1 tablet by mouth twice daily 180 tablet 1   SHINGRIX injection Inject 0.5 mLs into the muscle once.     tadalafil  (CIALIS ) 5 MG tablet Take 1 tablet (5 mg total) by mouth daily. 90 tablet 3   tamsulosin  (FLOMAX ) 0.4 MG CAPS capsule Take 1 capsule (0.4 mg total) by mouth daily. 90 capsule 3   TRESIBA  FLEXTOUCH 100 UNIT/ML FlexTouch Pen INJECT 30 UNITS SUBCUTANEOUSLY ONCE DAILY 15 mL 0   No current facility-administered medications on file prior to visit.   Past Medical History:  Diagnosis Date   Arthritis    Benign essential hypertension 04/27/2019   Chronic kidney disease, stage 3a (HCC) 04/27/2019   Family history of adverse reaction to anesthesia    Mothers sister had issues, but pt isn't sure what they were   Mixed hyperlipidemia 04/27/2019   Type 2 diabetes mellitus with diabetic polyneuropathy (HCC) 04/27/2019   Type 2 diabetes mellitus with stage 3 chronic kidney disease (HCC) 04/27/2019   Past Surgical History:  Procedure Laterality Date   COLONOSCOPY     ELBOW SURGERY Left 1994   KNEE ARTHROSCOPY Right 1998   KNEE CARTILAGE SURGERY Left 1994   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   Procedure: RIGHT TOTAL KNEE ARTHROPLASTY;  Surgeon: Jerri Kay HERO, MD;  Location: [facility] OR;  Service: Orthopedics;  Laterality: Right;   TOTAL KNEE ARTHROPLASTY Left 04/06/2022   Procedure: LEFT TOTAL KNEE ARTHROPLASTY;  Surgeon: Jerri Kay HERO, MD;  Location: [facility] OR;  Service: Orthopedics;  Laterality: Left;    Family History  Problem Relation Age of Onset   Diabetes Mother    Kidney disease Mother    Heart disease Father    Social History   Socioeconomic History   Marital status: Married    Spouse name: Jacqualyn   Number of children: 3   Years of  education: Not on file   Highest education level: Not on file  Occupational History   Not on file  Tobacco Use   Smoking status: Former    Current packs/day: 0.00    Average packs/day: 0.5 packs/day for 8.0 years (4.0 ttl pk-yrs)    Types: Cigarettes    Start date: 58    Quit date: 2000    Years since quitting: 25.5   Smokeless tobacco: Current    Types: Chew   Tobacco comments:    1 can per week  Vaping Use   Vaping status: Never Used  Substance and Sexual Activity   Alcohol use: Not Currently    Alcohol/week: 2.0 - 3.0 standard drinks of alcohol    Types: 1 Glasses of wine, 1 - 2 Standard drinks or equivalent per week    Comment: sometimes   Drug use: Never   Sexual activity: Yes    Partners: Female  Other Topics Concern   Not on file  Social History Narrative   Oldest son and youngest son passed away   Social Drivers of Health   Financial Resource Strain: Low Risk  (12/22/2022)   Overall Financial Resource Strain (CARDIA)    Difficulty of Paying Living Expenses: Not hard at all  Food Insecurity: No Food Insecurity (12/22/2022)   Hunger Vital Sign    Worried About Running Out of Food in the Last Year: Never true    Ran Out of Food in the Last Year: Never true  Transportation Needs: No Transportation Needs (12/22/2022)   PRAPARE - Administrator, Civil Service (Medical): No    Lack of Transportation (Non-Medical): No  Physical Activity: Insufficiently Active  (12/22/2022)   Exercise Vital Sign    Days of Exercise per Week: 3 days    Minutes of Exercise per Session: 30 min  Stress: No Stress Concern Present (12/22/2022)   [employer] of Occupational Health - Occupational Stress Questionnaire    Feeling of Stress : Not at all  Social Connections: Moderately Isolated (12/22/2022)   Social Connection and Isolation Panel    Frequency of Communication with Friends and Family: More than three times a week    Frequency of Social Gatherings with Friends and Family: More than three times a week    Attends Religious Services: Never    Database administrator or Organizations: No    Attends Engineer, structural: Never    Marital Status: Married    Objective:  BP 116/68   Pulse 74   Temp 97.7 F (36.5 C)   Ht 5' 10 (1.778 m)   Wt 187 lb 9.6 oz (85.1 kg)   SpO2 98%   BMI 26.92 kg/m      07/29/2023    7:43 AM 07/15/2023   10:43 AM 06/10/2023   10:02 AM  BP/Weight  Systolic BP 116 175 132  Diastolic BP 68 91 68  Wt. (Lbs) 187.6 185 188  BMI 26.92 kg/m2 26.54 kg/m2 26.98 kg/m2    Physical Exam Vitals reviewed.  Constitutional:      Appearance: Normal appearance.  Cardiovascular:     Rate and Rhythm: Normal rate and regular rhythm.     Heart sounds: Normal heart sounds.  Pulmonary:     Effort: Pulmonary effort is normal.     Breath sounds: Normal breath sounds.  Abdominal:     General: Bowel sounds are normal.     Palpations: Abdomen is soft.     Tenderness: There is  no abdominal tenderness.  Musculoskeletal:     Right shoulder: Tenderness present. No swelling or deformity. Decreased range of motion. Decreased strength.     Left shoulder: Tenderness present. No swelling or deformity. Decreased range of motion. Decreased strength.     Right wrist: Swelling, deformity and tenderness present.     Left wrist: Swelling and tenderness present.     Right hand: Deformity and tenderness present. Decreased range of motion.  Decreased strength.     Left hand: Deformity and tenderness present. Decreased range of motion. Decreased strength.       Arms:     Comments: Ganglion cyst noted on dorsal aspect of right wrist.  Arthritis throughout both hands.   Neurological:     Mental Status: He is alert and oriented to person, place, and time.  Psychiatric:        Mood and Affect: Mood normal.        Behavior: Behavior normal.         Lab Results  Component Value Date   WBC 6.7 04/28/2023   HGB 16.1 04/28/2023   HCT 47.7 04/28/2023   PLT 334 04/28/2023   GLUCOSE 87 04/28/2023   CHOL 155 04/28/2023   TRIG 51 04/28/2023   HDL 75 04/28/2023   LDLCALC 69 04/28/2023   ALT 11 04/28/2023   AST 12 04/28/2023   NA 144 04/28/2023   K 4.0 04/28/2023   CL 105 04/28/2023   CREATININE 0.76 04/28/2023   BUN 20 04/28/2023   CO2 21 04/28/2023   HGBA1C 6.3 (H) 04/28/2023   MICROALBUR 80 05/17/2019      Assessment & Plan:  Benign essential hypertension Assessment & Plan: Hypertension stable with current management. Continue taking Lotrel as prescribed Will adjust treatments based on blood pressures.  BP Readings from Last 3 Encounters:  07/29/23 116/68  07/15/23 (!) 175/91  06/10/23 132/68     Orders: -     CBC with Differential/Platelet -     Comprehensive metabolic panel with GFR  Type 2 diabetes mellitus with diabetic polyneuropathy, with long-term current use of insulin  (HCC) Assessment & Plan: Diabetes well-controlled with stable blood sugar levels around 108 mg/dL. Continue using Tresiba , Farxiga  10mg , and Metformin  1000mg  Labs drawn today Will adjust treatment based on results Lab Results  Component Value Date   HGBA1C 6.3 (H) 04/28/2023   HGBA1C 6.1 (H) 01/18/2023   HGBA1C 6.6 (H) 09/16/2022     Orders: -     Comprehensive metabolic panel with GFR -     Hemoglobin A1c  Mixed hyperlipidemia Assessment & Plan: Controlled Continue to monitor diet and exercise Labs drawn  today Will adjust treatment depending on results Continue taking Lipitor 40mg  as directed Lab Results  Component Value Date   LDLCALC 69 04/28/2023     Orders: -     Lipid panel  Primary osteoarthritis of right shoulder Assessment & Plan: Persistent pain in shoulders and hands with insufficient relief from current tramadol  regimen. Discussed shoulder replacement risks and benefits. Suggested physical therapy for strength and mobility improvement. - Prescribe 200 mg extended-release tramadol  once daily. - Refer to physical therapy for hands and shoulders.  Orders: -     traMADol  HCl ER; Take 1 tablet (200 mg total) by mouth daily.  Dispense: 30 tablet; Refill: 0  Acute bilateral low back pain without sciatica -     traMADol  HCl ER; Take 1 tablet (200 mg total) by mouth daily.  Dispense: 30 tablet; Refill: 0  Ganglion  cyst of dorsum of right wrist Assessment & Plan: Hand ganglion cyst causing pain without limiting motion. Continue to monitor Will consider draining it or referring to a hand surgeon to remove it.      Asbestos Exposure Asymptomatic nodule present. They are informed about asbestos risks.  General Health Maintenance Maintains joint mobility and good control of blood sugar and blood pressure. - Order blood tests to check cholesterol and kidney function.      Meds ordered this encounter  Medications   traMADol  (ULTRAM -ER) 200 MG 24 hr tablet    Sig: Take 1 tablet (200 mg total) by mouth daily.    Dispense:  30 tablet    Refill:  0    Orders Placed This Encounter  Procedures   CBC with Differential   Comprehensive metabolic panel with GFR   Lipid Panel   Hemoglobin A1c     Follow-up: Return in about 3 months (around 10/29/2023) for Chronic, Nola.   I,Candice Gribble,acting as a Neurosurgeon for [employer], PA.,have documented all relevant documentation on the behalf of Nola Angles, PA,as directed by  Nola Angles, PA while in the presence of Nola Angles,  [place].   An After Visit Summary was printed and given to the patient.  Nola Angles, [place] [facility] [phone]

## 2023-07-29 NOTE — Assessment & Plan Note (Signed)
 Persistent pain in shoulders and hands with insufficient relief from current tramadol  regimen. Discussed shoulder replacement risks and benefits. Suggested physical therapy for strength and mobility improvement. - Prescribe 200 mg extended-release tramadol  once daily. - Refer to physical therapy for hands and shoulders.

## 2023-07-29 NOTE — Assessment & Plan Note (Signed)
 Diabetes well-controlled with stable blood sugar levels around 108 mg/dL. Continue using Tresiba , Farxiga  10mg , and Metformin  1000mg  Labs drawn today Will adjust treatment based on results Lab Results  Component Value Date   HGBA1C 6.3 (H) 04/28/2023   HGBA1C 6.1 (H) 01/18/2023   HGBA1C 6.6 (H) 09/16/2022

## 2023-07-29 NOTE — Assessment & Plan Note (Signed)
 Controlled Continue to monitor diet and exercise Labs drawn today Will adjust treatment depending on results Continue taking Lipitor 40mg  as directed Lab Results  Component Value Date   LDLCALC 69 04/28/2023

## 2023-07-29 NOTE — Assessment & Plan Note (Signed)
 Hypertension stable with current management. Continue taking Lotrel as prescribed Will adjust treatments based on blood pressures.  BP Readings from Last 3 Encounters:  07/29/23 116/68  07/15/23 (!) 175/91  06/10/23 132/68

## 2023-07-30 ENCOUNTER — Ambulatory Visit: Admitting: Physician Assistant

## 2023-07-30 LAB — COMPREHENSIVE METABOLIC PANEL WITH GFR
ALT: 9 IU/L (ref 0–44)
AST: 13 IU/L (ref 0–40)
Albumin: 4.5 g/dL (ref 3.8–4.8)
Alkaline Phosphatase: 70 IU/L (ref 44–121)
BUN/Creatinine Ratio: 20 (ref 10–24)
BUN: 17 mg/dL (ref 8–27)
Bilirubin Total: 0.9 mg/dL (ref 0.0–1.2)
CO2: 20 mmol/L (ref 20–29)
Calcium: 10.3 mg/dL — ABNORMAL HIGH (ref 8.6–10.2)
Chloride: 102 mmol/L (ref 96–106)
Creatinine, Ser: 0.86 mg/dL (ref 0.76–1.27)
Globulin, Total: 2.2 g/dL (ref 1.5–4.5)
Glucose: 82 mg/dL (ref 70–99)
Potassium: 4 mmol/L (ref 3.5–5.2)
Sodium: 141 mmol/L (ref 134–144)
Total Protein: 6.7 g/dL (ref 6.0–8.5)
eGFR: 91 mL/min/1.73 (ref >59)

## 2023-07-30 LAB — CBC WITH DIFFERENTIAL/PLATELET
Basophils Absolute: 0.1 x10E3/uL (ref 0.0–0.2)
Basos: 1 %
EOS (ABSOLUTE): 0.4 x10E3/uL (ref 0.0–0.4)
Eos: 6 %
Hematocrit: 44.1 % (ref 37.5–51.0)
Hemoglobin: 14.9 g/dL (ref 13.0–17.7)
Immature Grans (Abs): 0 x10E3/uL (ref 0.0–0.1)
Immature Granulocytes: 0 %
Lymphocytes Absolute: 1.2 x10E3/uL (ref 0.7–3.1)
Lymphs: 22 %
MCH: 31.9 pg (ref 26.6–33.0)
MCHC: 33.8 g/dL (ref 31.5–35.7)
MCV: 94 fL (ref 79–97)
Monocytes Absolute: 0.6 x10E3/uL (ref 0.1–0.9)
Monocytes: 10 %
Neutrophils Absolute: 3.4 x10E3/uL (ref 1.4–7.0)
Neutrophils: 60 %
Platelets: 283 x10E3/uL (ref 150–450)
RBC: 4.67 x10E6/uL (ref 4.14–5.80)
RDW: 14.1 % (ref 11.6–15.4)
WBC: 5.6 x10E3/uL (ref 3.4–10.8)

## 2023-07-30 LAB — LIPID PANEL
Chol/HDL Ratio: 1.9 ratio (ref 0.0–5.0)
Cholesterol, Total: 139 mg/dL (ref 100–199)
HDL: 74 mg/dL (ref >39)
LDL Chol Calc (NIH): 51 mg/dL (ref 0–99)
Triglycerides: 68 mg/dL (ref 0–149)
VLDL Cholesterol Cal: 14 mg/dL (ref 5–40)

## 2023-07-30 LAB — HEMOGLOBIN A1C
Est. average glucose Bld gHb Est-mCnc: 134 mg/dL
Hgb A1c MFr Bld: 6.3 % — ABNORMAL HIGH (ref 4.8–5.6)

## 2023-08-02 ENCOUNTER — Ambulatory Visit: Payer: Self-pay | Admitting: Physician Assistant

## 2023-08-12 ENCOUNTER — Other Ambulatory Visit: Payer: Self-pay | Admitting: Physician Assistant

## 2023-08-12 DIAGNOSIS — I10 Essential (primary) hypertension: Secondary | ICD-10-CM

## 2023-08-14 ENCOUNTER — Other Ambulatory Visit: Payer: Self-pay | Admitting: Family Medicine

## 2023-08-18 ENCOUNTER — Other Ambulatory Visit: Payer: Self-pay | Admitting: Physician Assistant

## 2023-08-18 DIAGNOSIS — F419 Anxiety disorder, unspecified: Secondary | ICD-10-CM

## 2023-08-20 ENCOUNTER — Other Ambulatory Visit: Payer: Self-pay | Admitting: Physician Assistant

## 2023-08-20 DIAGNOSIS — E1142 Type 2 diabetes mellitus with diabetic polyneuropathy: Secondary | ICD-10-CM

## 2023-08-26 ENCOUNTER — Other Ambulatory Visit: Payer: Self-pay | Admitting: Physician Assistant

## 2023-08-26 DIAGNOSIS — M545 Low back pain, unspecified: Secondary | ICD-10-CM

## 2023-08-26 DIAGNOSIS — M19011 Primary osteoarthritis, right shoulder: Secondary | ICD-10-CM

## 2023-08-30 ENCOUNTER — Other Ambulatory Visit: Payer: Self-pay | Admitting: Physician Assistant

## 2023-08-30 DIAGNOSIS — M545 Low back pain, unspecified: Secondary | ICD-10-CM

## 2023-08-30 DIAGNOSIS — M19011 Primary osteoarthritis, right shoulder: Secondary | ICD-10-CM

## 2023-08-30 DIAGNOSIS — I10 Essential (primary) hypertension: Secondary | ICD-10-CM

## 2023-08-30 NOTE — Telephone Encounter (Signed)
 Copied from CRM [id number]. Topic: Clinical - Medication Refill >> Aug 30, 2023  9:45 AM Cynthia K wrote: Medication:  traMADol  (ULTRAM -ER) 200 MG 24 hr tablet amLODipine -benazepril  (LOTREL) 10-20 MG capsule  Has the patient contacted their pharmacy? Yes (Agent: If no, request that the patient contact the pharmacy for the refill. If patient does not wish to contact the pharmacy document the reason why and proceed with request.) (Agent: If yes, when and what did the pharmacy advise?) Pharmacy needs order to refill  This is the patient's preferred pharmacy:  [facility] [address], [place] - [address] [place] [address] Phone: [phone] Fax: [phone]  Is this the correct pharmacy for this prescription? Yes If no, delete pharmacy and type the correct one.   Has the prescription been filled recently? No  Is the patient out of the medication? Yes - He has been out of medications for 1 day  Has the patient been seen for an appointment in the last year OR does the patient have an upcoming appointment? Yes  Can we respond through MyChart? Yes  Agent: Please be advised that Rx refills may take up to 3 business days. We ask that you follow-up with your pharmacy.

## 2023-08-31 ENCOUNTER — Other Ambulatory Visit: Payer: Self-pay | Admitting: Physician Assistant

## 2023-08-31 DIAGNOSIS — M545 Low back pain, unspecified: Secondary | ICD-10-CM

## 2023-08-31 DIAGNOSIS — M19011 Primary osteoarthritis, right shoulder: Secondary | ICD-10-CM

## 2023-08-31 MED ORDER — AMLODIPINE BESY-BENAZEPRIL HCL 10-20 MG PO CAPS
1.0000 | ORAL_CAPSULE | Freq: Every day | ORAL | 0 refills | Status: AC
Start: 2023-08-31 — End: ?

## 2023-08-31 NOTE — Telephone Encounter (Signed)
 Copied from CRM [id number]. Topic: General - Other >> Aug 30, 2023  4:06 PM Essie A wrote: Reason for CRM: Patient is calling again regarding refill request for traMADol  (ULTRAM -ER) 200 MG 24 hr tablet.  They will need to wait until the request is sent to the pharmacy. >> Aug 31, 2023  2:09 PM Essie A wrote: Patient called again regarding his prescriptions.  They were sent to the pharmacy today.  He will pick them up today.

## 2023-09-03 ENCOUNTER — Other Ambulatory Visit: Payer: Self-pay | Admitting: Family Medicine

## 2023-09-03 DIAGNOSIS — E1142 Type 2 diabetes mellitus with diabetic polyneuropathy: Secondary | ICD-10-CM

## 2023-09-06 MED ORDER — TRAMADOL HCL ER 200 MG PO TB24: 200.0000 mg | ORAL_TABLET | Freq: Every day | ORAL | 0 refills | Status: DC

## 2023-09-15 ENCOUNTER — Other Ambulatory Visit: Payer: Self-pay | Admitting: Physician Assistant

## 2023-09-15 DIAGNOSIS — F419 Anxiety disorder, unspecified: Secondary | ICD-10-CM

## 2023-10-09 ENCOUNTER — Other Ambulatory Visit: Payer: Self-pay | Admitting: Physician Assistant

## 2023-10-09 DIAGNOSIS — E782 Mixed hyperlipidemia: Secondary | ICD-10-CM

## 2023-10-13 ENCOUNTER — Other Ambulatory Visit: Payer: Self-pay | Admitting: Physician Assistant

## 2023-10-13 DIAGNOSIS — E1142 Type 2 diabetes mellitus with diabetic polyneuropathy: Secondary | ICD-10-CM

## 2023-10-14 ENCOUNTER — Other Ambulatory Visit: Payer: Self-pay | Admitting: Physician Assistant

## 2023-10-14 DIAGNOSIS — F419 Anxiety disorder, unspecified: Secondary | ICD-10-CM

## 2023-10-26 ENCOUNTER — Other Ambulatory Visit: Payer: Self-pay | Admitting: Physician Assistant

## 2023-10-26 DIAGNOSIS — M19011 Primary osteoarthritis, right shoulder: Secondary | ICD-10-CM

## 2023-10-26 DIAGNOSIS — M545 Low back pain, unspecified: Secondary | ICD-10-CM

## 2023-11-02 ENCOUNTER — Ambulatory Visit: Admitting: Physician Assistant

## 2023-11-02 ENCOUNTER — Encounter: Payer: Self-pay | Admitting: Physician Assistant

## 2023-11-02 VITALS — BP 124/62 | HR 60 | Temp 98.2°F | Resp 18 | Ht 70.0 in | Wt 191.2 lb

## 2023-11-02 DIAGNOSIS — I10 Essential (primary) hypertension: Secondary | ICD-10-CM | POA: Diagnosis not present

## 2023-11-02 DIAGNOSIS — E1122 Type 2 diabetes mellitus with diabetic chronic kidney disease: Secondary | ICD-10-CM | POA: Diagnosis not present

## 2023-11-02 DIAGNOSIS — F419 Anxiety disorder, unspecified: Secondary | ICD-10-CM

## 2023-11-02 DIAGNOSIS — E782 Mixed hyperlipidemia: Secondary | ICD-10-CM

## 2023-11-02 DIAGNOSIS — G5603 Carpal tunnel syndrome, bilateral upper limbs: Secondary | ICD-10-CM

## 2023-11-02 DIAGNOSIS — Z23 Encounter for immunization: Secondary | ICD-10-CM | POA: Diagnosis not present

## 2023-11-02 DIAGNOSIS — M19011 Primary osteoarthritis, right shoulder: Secondary | ICD-10-CM

## 2023-11-02 DIAGNOSIS — N1831 Chronic kidney disease, stage 3a: Secondary | ICD-10-CM | POA: Diagnosis not present

## 2023-11-02 DIAGNOSIS — F5101 Primary insomnia: Secondary | ICD-10-CM | POA: Diagnosis not present

## 2023-11-02 MED ORDER — DICLOFENAC SODIUM 1 % EX GEL: 4.0000 g | Freq: Four times a day (QID) | CUTANEOUS | 3 refills | Status: AC

## 2023-11-02 MED ORDER — ALPRAZOLAM 1 MG PO TABS: 1.0000 mg | ORAL_TABLET | Freq: Every evening | ORAL | 0 refills | Status: DC | PRN

## 2023-11-02 NOTE — Assessment & Plan Note (Addendum)
 Chronic right shoulder pain Persistent pain affecting daily activities, not interested in surgery. - Prescribe Voltaren  gel for topical use. - Recommend physical therapy for rotator cuff strengthening. Orders:   Comprehensive metabolic panel with GFR   diclofenac  Sodium (VOLTAREN  ARTHRITIS PAIN) 1 % GEL; Apply 4 g topically 4 (four) times daily.

## 2023-11-02 NOTE — Progress Notes (Signed)
 Subjective:  Patient ID: Clayton James, male    DOB: 04-Oct-1950  Age: 73 y.o. MRN: [id number]  Chief Complaint  Patient presents with   Medical Management of Chronic Issues    HPI:  Discussed the use of AI scribe software for clinical note transcription with the patient, who gave verbal consent to proceed.  History of Present Illness Clayton James is a 73 year old male who presents with chronic joint and shoulder pain.  He experiences ongoing joint and shoulder pain, which is partially relieved by medication but worsens at night, affecting his sleep. He often wakes up early and struggles to fall asleep, even after taking Xanax , which he sometimes takes in a higher dose at night. His discomfort is noticeable during tasks like using a screw gun overhead, which he used to do without issue. He describes difficulty with overhead activities and getting dressed, noting variability in symptom severity. He uses a dumbbell for exercises. Pain after activities like weed whacking can last for several days.  He experiences numbness and tingling in his hands, particularly when holding objects like a fishing rod. This sensation is not constant but occurs with certain positions or repetitive actions. He has a lump in one hand and suspects carpal tunnel syndrome, though he has not had a formal diagnosis. No shooting pain into the hands.  He has a history of diabetes and has not been monitoring his blood sugar recently due to not receiving his usual [employer] sensors. He finds the device helpful and is concerned about not having it. He drinks soy milk and has a history of elevated calcium  levels.  He has been drinking alcohol more frequently at night to help with relaxation and sleep, which he acknowledges is not ideal. He takes tramadol  for pain, which provides relief within an hour, but he feels the effects diminish by the end of the day.        07/29/2023    7:45 AM 04/28/2023    8:19 AM  01/18/2023    7:33 AM 12/22/2022   10:23 AM 09/16/2022    7:29 AM  Depression screen PHQ 2/9  Decreased Interest 0 0 2 0 0  Down, Depressed, Hopeless 0 0 0 0 0  PHQ - 2 Score 0 0 2 0 0  Altered sleeping   1 0   Tired, decreased energy   1 0   Change in appetite   0 0   Feeling bad or failure about yourself    0 0   Trouble concentrating   0 0   Moving slowly or fidgety/restless   0 0   Suicidal thoughts   0 0   PHQ-9 Score   4 0   Difficult doing work/chores   Not difficult at all Not difficult at all         07/29/2023    7:45 AM  Fall Risk   Falls in the past year? 0  Number falls in past yr: 0  Injury with Fall? 0  Risk for fall due to : No Fall Risks    Patient Care Team: Milon Cleaves, [place] as PCP - General (Physician Assistant) Clayton James, [facility] (Inactive) (Pharmacist)   Review of Systems  Constitutional:  Negative for appetite change, fatigue and fever.  HENT:  Negative for congestion, ear pain, sinus pressure and sore throat.   Eyes: Negative.   Respiratory:  Negative for cough, chest tightness, shortness of breath and wheezing.   Cardiovascular:  Negative for chest pain and palpitations.  Gastrointestinal:  Negative for abdominal pain, constipation, diarrhea, nausea and vomiting.  Endocrine: Negative.   Genitourinary:  Negative for dysuria, frequency, hematuria and urgency.  Musculoskeletal:  Negative for arthralgias, back pain, joint swelling and myalgias.  Skin:  Negative for rash.  Allergic/Immunologic: Negative.   Neurological:  Negative for dizziness, weakness, light-headedness and headaches.  Hematological: Negative.   Psychiatric/Behavioral:  Negative for dysphoric mood. The patient is not nervous/anxious.     Current Outpatient Medications on File Prior to Visit  Medication Sig Dispense Refill   acetaminophen  (TYLENOL ) 325 MG tablet Take 2 tablets (650 mg total) by mouth every 6 (six) hours as needed for moderate pain (pain score 4-6). 60 tablet 3    amLODipine -benazepril  (LOTREL) 10-20 MG capsule Take 1 capsule by mouth daily. 90 capsule 0   atorvastatin  (LIPITOR) 40 MG tablet Take 1 tablet by mouth once daily 90 tablet 0   Blood Glucose Monitoring Suppl (ONE TOUCH ULTRA 2) w/Device KIT USE TO CHECK GLUCOSE THREE TIMES DAILY     FARXIGA  10 MG TABS tablet Take 1 tablet by mouth once daily 90 tablet 0   glucose blood test strip 1 each by Other route daily. Use as instructed 100 each 2   Insulin  Pen Needle (BD PEN NEEDLE MICRO U/F) 32G X 6 MM MISC USE AS DIRECTED ONCE DAILY 100 each 3   metFORMIN  (GLUCOPHAGE ) 1000 MG tablet Take 1 tablet by mouth twice daily 180 tablet 0   tadalafil  (CIALIS ) 5 MG tablet Take 1 tablet (5 mg total) by mouth daily. 90 tablet 3   tamsulosin  (FLOMAX ) 0.4 MG CAPS capsule Take 1 capsule (0.4 mg total) by mouth daily. 90 capsule 3   traMADol  (ULTRAM -ER) 200 MG 24 hr tablet Take 1 tablet by mouth once daily 30 tablet 0   TRESIBA  FLEXTOUCH 100 UNIT/ML FlexTouch Pen INJECT 30 UNITS SUBCUTANEOUSLY ONCE DAILY 15 mL 0   SHINGRIX injection Inject 0.5 mLs into the muscle once. (Patient not taking: Reported on 11/02/2023)     No current facility-administered medications on file prior to visit.   Past Medical History:  Diagnosis Date   Arthritis    Benign essential hypertension 04/27/2019   Chronic kidney disease, stage 3a (HCC) 04/27/2019   Family history of adverse reaction to anesthesia    Mothers sister had issues, but pt isn't sure what they were   Mixed hyperlipidemia 04/27/2019   Type 2 diabetes mellitus with diabetic polyneuropathy (HCC) 04/27/2019   Type 2 diabetes mellitus with stage 3 chronic kidney disease (HCC) 04/27/2019   Past Surgical History:  Procedure Laterality Date   COLONOSCOPY     ELBOW SURGERY Left 1994   KNEE ARTHROSCOPY Right 1998   KNEE CARTILAGE SURGERY Left 1994   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   Procedure: RIGHT TOTAL KNEE ARTHROPLASTY;  Surgeon: Jerri Kay HERO, MD;  Location:  [facility] OR;  Service: Orthopedics;  Laterality: Right;   TOTAL KNEE ARTHROPLASTY Left 04/06/2022   Procedure: LEFT TOTAL KNEE ARTHROPLASTY;  Surgeon: Jerri Kay HERO, MD;  Location: [facility] OR;  Service: Orthopedics;  Laterality: Left;    Family History  Problem Relation Age of Onset   Diabetes Mother    Kidney disease Mother    Heart disease Father    Social History   Socioeconomic History   Marital status: Married    Spouse name: Clayton James   Number of children: 3   Years of education: Not on file   Highest  education level: Not on file  Occupational History   Not on file  Tobacco Use   Smoking status: Former    Current packs/day: 0.00    Average packs/day: 0.5 packs/day for 8.0 years (4.0 ttl pk-yrs)    Types: Cigarettes    Start date: 59    Quit date: 2000    Years since quitting: 25.8   Smokeless tobacco: Current    Types: Chew   Tobacco comments:    1 can per week  Vaping Use   Vaping status: Never Used  Substance and Sexual Activity   Alcohol use: Not Currently    Alcohol/week: 2.0 - 3.0 standard drinks of alcohol    Types: 1 Glasses of wine, 1 - 2 Standard drinks or equivalent per week    Comment: sometimes   Drug use: Never   Sexual activity: Yes    Partners: Female  Other Topics Concern   Not on file  Social History Narrative   Oldest son and youngest son passed away   Social Drivers of Health   Financial Resource Strain: Low Risk  (12/22/2022)   Overall Financial Resource Strain (CARDIA)    Difficulty of Paying Living Expenses: Not hard at all  Food Insecurity: No Food Insecurity (12/22/2022)   Hunger Vital Sign    Worried About Running Out of Food in the Last Year: Never true    Ran Out of Food in the Last Year: Never true  Transportation Needs: No Transportation Needs (12/22/2022)   PRAPARE - Administrator, Civil Service (Medical): No    Lack of Transportation (Non-Medical): No  Physical Activity: Insufficiently Active (12/22/2022)   Exercise Vital  Sign    Days of Exercise per Week: 3 days    Minutes of Exercise per Session: 30 min  Stress: No Stress Concern Present (12/22/2022)   [employer] of Occupational Health - Occupational Stress Questionnaire    Feeling of Stress : Not at all  Social Connections: Moderately Isolated (12/22/2022)   Social Connection and Isolation Panel    Frequency of Communication with Friends and Family: More than three times a week    Frequency of Social Gatherings with Friends and Family: More than three times a week    Attends Religious Services: Never    Database administrator or Organizations: No    Attends Engineer, structural: Never    Marital Status: Married    Objective:  BP 124/62   Pulse 60   Temp 98.2 F (36.8 C) (Temporal)   Resp 18   Ht 5' 10 (1.778 m)   Wt 191 lb 3.2 oz (86.7 kg)   SpO2 97%   BMI 27.43 kg/m      11/02/2023    7:54 AM 07/29/2023    7:43 AM 07/15/2023   10:43 AM  BP/Weight  Systolic BP 124 116 175  Diastolic BP 62 68 91  Wt. (Lbs) 191.2 187.6 185  BMI 27.43 kg/m2 26.92 kg/m2 26.54 kg/m2    Physical Exam Vitals reviewed.  Constitutional:      Appearance: Normal appearance.  Neck:     Vascular: No carotid bruit.  Cardiovascular:     Rate and Rhythm: Normal rate and regular rhythm.     Heart sounds: Normal heart sounds.  Pulmonary:     Effort: Pulmonary effort is normal.     Breath sounds: Normal breath sounds.  Abdominal:     General: Bowel sounds are normal.     Palpations: Abdomen  is soft.     Tenderness: There is no abdominal tenderness.  Neurological:     Mental Status: He is alert and oriented to person, place, and time.  Psychiatric:        Mood and Affect: Mood normal.        Behavior: Behavior normal.         Lab Results  Component Value Date   WBC 5.6 07/29/2023   HGB 14.9 07/29/2023   HCT 44.1 07/29/2023   PLT 283 07/29/2023   GLUCOSE 55 (L) 11/02/2023   CHOL 157 11/02/2023   TRIG 74 11/02/2023   HDL 76  11/02/2023   LDLCALC 67 11/02/2023   ALT 13 11/02/2023   AST 14 11/02/2023   NA 142 11/02/2023   K 4.0 11/02/2023   CL 105 11/02/2023   CREATININE 0.77 11/02/2023   BUN 17 11/02/2023   CO2 22 11/02/2023   TSH 4.240 11/02/2023   HGBA1C 6.3 (H) 11/02/2023    Results for orders placed or performed in visit on 11/02/23  Comprehensive metabolic panel with GFR   Collection Time: 11/02/23  8:41 AM  Result Value Ref Range   Glucose 55 (L) 70 - 99 mg/dL   BUN 17 8 - 27 mg/dL   Creatinine, Ser 9.22 0.76 - 1.27 mg/dL   eGFR 95 >40 fO/fpw/8.26   BUN/Creatinine Ratio 22 10 - 24   Sodium 142 134 - 144 mmol/L   Potassium 4.0 3.5 - 5.2 mmol/L   Chloride 105 96 - 106 mmol/L   CO2 22 20 - 29 mmol/L   Calcium  9.6 8.6 - 10.2 mg/dL   Total Protein 6.6 6.0 - 8.5 g/dL   Albumin 4.5 3.8 - 4.8 g/dL   Globulin, Total 2.1 1.5 - 4.5 g/dL   Bilirubin Total 0.6 0.0 - 1.2 mg/dL   Alkaline Phosphatase 69 47 - 123 IU/L   AST 14 0 - 40 IU/L   ALT 13 0 - 44 IU/L  Hemoglobin A1c   Collection Time: 11/02/23  8:41 AM  Result Value Ref Range   Hgb A1c MFr Bld 6.3 (H) 4.8 - 5.6 %   Est. average glucose Bld gHb Est-mCnc 134 mg/dL  Lipid panel   Collection Time: 11/02/23  8:41 AM  Result Value Ref Range   Cholesterol, Total 157 100 - 199 mg/dL   Triglycerides 74 0 - 149 mg/dL   HDL 76 >60 mg/dL   VLDL Cholesterol Cal 14 5 - 40 mg/dL   LDL Chol Calc (NIH) 67 0 - 99 mg/dL   Chol/HDL Ratio 2.1 0.0 - 5.0 ratio  T4, free   Collection Time: 11/02/23  8:41 AM  Result Value Ref Range   Free T4 1.31 0.82 - 1.77 ng/dL  TSH   Collection Time: 11/02/23  8:41 AM  Result Value Ref Range   TSH 4.240 0.450 - 4.500 uIU/mL  .Total time spent on today's visit was 40 minutes, including both face-to-face time and nonface-to-face time personally spent on review of chart (labs and imaging), discussing labs and goals, discussing further work-up, treatment options, referrals to specialist if needed, reviewing outside records  of pertinent, answering patient's questions, and coordinating care.   Assessment & Plan:   Assessment & Plan Benign essential hypertension Hypertension stable with current management. Continue taking Lotrel as prescribed Will adjust treatments based on blood pressures.  BP Readings from Last 3 Encounters:  11/02/23 124/62  07/29/23 116/68  07/15/23 (!) 175/91    Orders:   T4, free  TSH  Anxiety Anxiety Reports difficulty shutting off mind at night, possibly related to past traumatic events. -Continue current Xanax  1mg  regimen. -Monitor for any increase in anxiety symptoms and adjust medication as needed.  Orders:   ALPRAZolam  (XANAX ) 1 MG tablet; Take 1 tablet (1 mg total) by mouth at bedtime as needed for anxiety.  Primary osteoarthritis of right shoulder Chronic right shoulder pain Persistent pain affecting daily activities, not interested in surgery. - Prescribe Voltaren  gel for topical use. - Recommend physical therapy for rotator cuff strengthening. Orders:   Comprehensive metabolic panel with GFR   diclofenac  Sodium (VOLTAREN  ARTHRITIS PAIN) 1 % GEL; Apply 4 g topically 4 (four) times daily.  Mixed hyperlipidemia  Orders:   Lipid panel  Type 2 diabetes mellitus with stage 3a chronic kidney disease, without long-term current use of insulin  (HCC) Type 2 diabetes mellitus Lapse in glucose monitoring due to sensor supply issue. - Investigate [employer] sensor supply and ensure provision. Orders:   Hemoglobin A1c  Encounter for immunization  Orders:   Flu vaccine HIGH DOSE PF(Fluzone Trivalent)  Bilateral carpal tunnel syndrome Bilateral hand pain and numbness (possible carpal tunnel syndrome) Symptoms suggestive of carpal tunnel syndrome, exacerbated by activities. - Discuss nerve conduction study. - Consider referral to hand specialist.    Primary insomnia Chronic insomnia Difficulty maintaining sleep, increased alcohol use as a sleep aid is  inadvisable. - Increase Xanax  to 1 mg at night. - Advise against alcohol use for sleep.  Alcohol use, increased Increased evening use for relaxation and sleep, poses health risks. - Counsel on risks of increased alcohol use.      Body mass index is 27.43 kg/m.     Meds ordered this encounter  Medications   ALPRAZolam  (XANAX ) 1 MG tablet    Sig: Take 1 tablet (1 mg total) by mouth at bedtime as needed for anxiety.    Dispense:  30 tablet    Refill:  0   diclofenac  Sodium (VOLTAREN  ARTHRITIS PAIN) 1 % GEL    Sig: Apply 4 g topically 4 (four) times daily.    Dispense:  100 g    Refill:  3    Orders Placed This Encounter  Procedures   Flu vaccine HIGH DOSE PF(Fluzone Trivalent)   Comprehensive metabolic panel with GFR   Hemoglobin A1c   Lipid panel   T4, free   TSH       Follow-up: Return in about 4 months (around 03/04/2024) for Chronic, Clayton.  An After Visit Summary was printed and given to the patient.  Clayton James, [place] [facility] [phone]

## 2023-11-02 NOTE — Assessment & Plan Note (Addendum)
 Orders:    Lipid panel

## 2023-11-02 NOTE — Assessment & Plan Note (Addendum)
 Anxiety Reports difficulty shutting off mind at night, possibly related to past traumatic events. -Continue current Xanax  1mg  regimen. -Monitor for any increase in anxiety symptoms and adjust medication as needed.  Orders:   ALPRAZolam  (XANAX ) 1 MG tablet; Take 1 tablet (1 mg total) by mouth at bedtime as needed for anxiety.

## 2023-11-02 NOTE — Assessment & Plan Note (Addendum)
 Type 2 diabetes mellitus Lapse in glucose monitoring due to sensor supply issue. - Investigate [employer] sensor supply and ensure provision. Orders:   Hemoglobin A1c

## 2023-11-02 NOTE — Assessment & Plan Note (Addendum)
 Hypertension stable with current management. Continue taking Lotrel as prescribed Will adjust treatments based on blood pressures.  BP Readings from Last 3 Encounters:  11/02/23 124/62  07/29/23 116/68  07/15/23 (!) 175/91    Orders:   T4, free   TSH

## 2023-11-03 LAB — COMPREHENSIVE METABOLIC PANEL WITH GFR
ALT: 13 IU/L (ref 0–44)
AST: 14 IU/L (ref 0–40)
Albumin: 4.5 g/dL (ref 3.8–4.8)
Alkaline Phosphatase: 69 IU/L (ref 47–123)
BUN/Creatinine Ratio: 22 (ref 10–24)
BUN: 17 mg/dL (ref 8–27)
Bilirubin Total: 0.6 mg/dL (ref 0.0–1.2)
CO2: 22 mmol/L (ref 20–29)
Calcium: 9.6 mg/dL (ref 8.6–10.2)
Chloride: 105 mmol/L (ref 96–106)
Creatinine, Ser: 0.77 mg/dL (ref 0.76–1.27)
Globulin, Total: 2.1 g/dL (ref 1.5–4.5)
Glucose: 55 mg/dL — ABNORMAL LOW (ref 70–99)
Potassium: 4 mmol/L (ref 3.5–5.2)
Sodium: 142 mmol/L (ref 134–144)
Total Protein: 6.6 g/dL (ref 6.0–8.5)
eGFR: 95 mL/min/1.73 (ref >59)

## 2023-11-03 LAB — LIPID PANEL
Chol/HDL Ratio: 2.1 ratio (ref 0.0–5.0)
Cholesterol, Total: 157 mg/dL (ref 100–199)
HDL: 76 mg/dL (ref >39)
LDL Chol Calc (NIH): 67 mg/dL (ref 0–99)
Triglycerides: 74 mg/dL (ref 0–149)
VLDL Cholesterol Cal: 14 mg/dL (ref 5–40)

## 2023-11-03 LAB — T4, FREE: Free T4: 1.31 ng/dL (ref 0.82–1.77)

## 2023-11-03 LAB — HEMOGLOBIN A1C
Est. average glucose Bld gHb Est-mCnc: 134 mg/dL
Hgb A1c MFr Bld: 6.3 % — ABNORMAL HIGH (ref 4.8–5.6)

## 2023-11-03 LAB — TSH: TSH: 4.24 u[IU]/mL (ref 0.450–4.500)

## 2023-11-04 ENCOUNTER — Telehealth: Payer: Self-pay

## 2023-11-04 ENCOUNTER — Ambulatory Visit: Payer: Self-pay | Admitting: Physician Assistant

## 2023-11-04 NOTE — Telephone Encounter (Signed)
 Copied from CRM [id number]. Topic: Referral - Question >> Nov 04, 2023  9:27 AM Clayton James wrote: Reason for CRM: patient is not sure he needs an referral for physical therapy, he went to the place yesterday but not sure what's its called. He was told they accept his insurance. Please advise [phone] (H)

## 2023-11-07 DIAGNOSIS — G4700 Insomnia, unspecified: Secondary | ICD-10-CM | POA: Insufficient documentation

## 2023-11-07 NOTE — Assessment & Plan Note (Signed)
 Bilateral hand pain and numbness (possible carpal tunnel syndrome) Symptoms suggestive of carpal tunnel syndrome, exacerbated by activities. - Discuss nerve conduction study. - Consider referral to hand specialist.

## 2023-11-07 NOTE — Assessment & Plan Note (Signed)
 Chronic insomnia Difficulty maintaining sleep, increased alcohol use as a sleep aid is inadvisable. - Increase Xanax  to 1 mg at night. - Advise against alcohol use for sleep.  Alcohol use, increased Increased evening use for relaxation and sleep, poses health risks. - Counsel on risks of increased alcohol use.

## 2023-11-13 ENCOUNTER — Other Ambulatory Visit: Payer: Self-pay | Admitting: Family Medicine

## 2023-11-22 ENCOUNTER — Encounter: Payer: Self-pay | Admitting: Radiology

## 2023-11-23 ENCOUNTER — Other Ambulatory Visit: Payer: Self-pay | Admitting: Physician Assistant

## 2023-11-23 DIAGNOSIS — M545 Low back pain, unspecified: Secondary | ICD-10-CM

## 2023-11-23 DIAGNOSIS — M19011 Primary osteoarthritis, right shoulder: Secondary | ICD-10-CM

## 2023-11-25 ENCOUNTER — Other Ambulatory Visit: Payer: Self-pay | Admitting: Physician Assistant

## 2023-11-25 DIAGNOSIS — F419 Anxiety disorder, unspecified: Secondary | ICD-10-CM

## 2023-12-04 ENCOUNTER — Other Ambulatory Visit: Payer: Self-pay | Admitting: Physician Assistant

## 2023-12-04 DIAGNOSIS — E1142 Type 2 diabetes mellitus with diabetic polyneuropathy: Secondary | ICD-10-CM

## 2023-12-05 ENCOUNTER — Other Ambulatory Visit: Payer: Self-pay | Admitting: Physician Assistant

## 2023-12-05 DIAGNOSIS — E1142 Type 2 diabetes mellitus with diabetic polyneuropathy: Secondary | ICD-10-CM

## 2023-12-09 ENCOUNTER — Telehealth: Payer: Self-pay | Admitting: Physician Assistant

## 2023-12-09 NOTE — Telephone Encounter (Signed)
 [employer] Diabetes Supply Order

## 2023-12-13 DIAGNOSIS — K5790 Diverticulosis of intestine, part unspecified, without perforation or abscess without bleeding: Secondary | ICD-10-CM | POA: Diagnosis not present

## 2023-12-15 ENCOUNTER — Telehealth: Payer: Self-pay | Admitting: Physician Assistant

## 2023-12-15 NOTE — Telephone Encounter (Signed)
 [employer] Order Form

## 2023-12-22 ENCOUNTER — Other Ambulatory Visit: Payer: Self-pay | Admitting: Physician Assistant

## 2023-12-22 DIAGNOSIS — M19011 Primary osteoarthritis, right shoulder: Secondary | ICD-10-CM

## 2023-12-22 DIAGNOSIS — M545 Low back pain, unspecified: Secondary | ICD-10-CM

## 2023-12-23 ENCOUNTER — Ambulatory Visit: Payer: Medicare HMO

## 2023-12-23 ENCOUNTER — Telehealth: Payer: Self-pay

## 2023-12-23 VITALS — BP 124/62 | Ht 70.0 in | Wt 185.0 lb

## 2023-12-23 DIAGNOSIS — Z0000 Encounter for general adult medical examination without abnormal findings: Secondary | ICD-10-CM

## 2023-12-23 NOTE — Telephone Encounter (Signed)
 Copied from CRM #[id number]. Topic: General - Other >> Dec 15, 2023  2:13 PM Selinda RAMAN wrote: Reason for CRM: Alyssa with [employer] called to let the provider know she is faxing over a form concerning diabetic supplies for the patient. She just needs it filled out and faxed back. Please assist patient further.

## 2023-12-23 NOTE — Patient Instructions (Signed)
 Mr. Clayton James,  Thank you for taking the time for your Medicare Wellness Visit. I appreciate your continued commitment to your health goals. Please review the care plan we discussed, and feel free to reach out if I can assist you further.  Please note that Annual Wellness Visits do not include a physical exam. Some assessments may be limited, especially if the visit was conducted virtually. If needed, we may recommend an in-person follow-up with your provider.  Ongoing Care Seeing your primary care provider every 3 to 6 months helps us  monitor your health and provide consistent, personalized care.   Referrals If a referral was made during today's visit and you haven't received any updates within two weeks, please contact the referred provider directly to check on the status.  Recommended Screenings:  Health Maintenance  Topic Date Due   Eye exam for diabetics  07/04/2021   Colon Cancer Screening  10/25/2023   Medicare Annual Wellness Visit  12/22/2023   Yearly kidney health urinalysis for diabetes  04/27/2024   Complete foot exam   04/27/2024   Hemoglobin A1C  05/02/2024   Yearly kidney function blood test for diabetes  11/01/2024   DTaP/Tdap/Td vaccine (2 - Td or Tdap) 05/30/2032   Pneumococcal Vaccine for age over 74  Completed   Flu Shot  Completed   Hepatitis C Screening  Completed   Zoster (Shingles) Vaccine  Completed   Meningitis B Vaccine  Aged Out   COVID-19 Vaccine  Discontinued   Cologuard (Stool DNA test)  Discontinued       12/22/2022   10:19 AM  Advanced Directives  Does Patient Have a Medical Advance Directive? No  Would patient like information on creating a medical advance directive? No - Patient declined    Vision: Annual vision screenings are recommended for early detection of glaucoma, cataracts, and diabetic retinopathy. These exams can also reveal signs of chronic conditions such as diabetes and high blood pressure.  Dental: Annual dental screenings help  detect early signs of oral cancer, gum disease, and other conditions linked to overall health, including heart disease and diabetes.  Please see the attached documents for additional preventive care recommendations.

## 2023-12-23 NOTE — Progress Notes (Signed)
 I connected with  Clayton James on 12/23/23 by a audio enabled telemedicine application and verified that I am speaking with the correct person using two identifiers.  Patient Location: Home  Provider Location: Home Office  Persons Participating in Visit: Patient.  I discussed the limitations of evaluation and management by telemedicine. The patient expressed understanding and agreed to proceed.  Vital Signs: Because this visit was a virtual/telehealth visit, some criteria may be missing or patient reported. Any vitals not documented were not able to be obtained and vitals that have been documented are patient reported.   Subjective:   Clayton James is a 73 y.o. male who presents for a Medicare Annual Wellness Visit.  Visit info / Clinical Intake: Medicare Wellness Visit Type:: Subsequent Annual Wellness Visit Persons participating in visit and providing information:: patient Medicare Wellness Visit Mode:: Telephone If telephone:: video declined Since this visit was completed virtually, some vitals may be partially provided or unavailable. Missing vitals are due to the limitations of the virtual format.: Documented vitals are patient reported If Telephone or Video please confirm:: I connected with patient using audio/video enable telemedicine. I verified patient identity with two identifiers, discussed telehealth limitations, and patient agreed to proceed. Patient Location:: home Provider Location:: home office Interpreter Needed?: No Pre-visit prep was completed: yes AWV questionnaire completed by patient prior to visit?: no Living arrangements:: lives with spouse/significant other Patient's Overall Health Status Rating: very good Typical amount of pain: some Does pain affect daily life?: no Are you currently prescribed opioids?: no  Dietary Habits and Nutritional Risks How many meals a day?: 3 Eats fruit and vegetables daily?: yes Most meals are obtained by: preparing own  meals; having others provide food In the last 2 weeks, have you had any of the following?: none Diabetic:: (!) yes Any non-healing wounds?: no How often do you check your BS?: 2 Would you like to be referred to a Nutritionist or for Diabetic Management? : no  Functional Status Activities of Daily Living (to include ambulation/medication): Independent Ambulation: Independent Medication Administration: Independent Home Management (perform basic housework or laundry): Independent Manage your own finances?: yes Primary transportation is: driving Concerns about vision?: no *vision screening is required for WTM* Concerns about hearing?: no  Fall Screening Falls in the past year?: 0 Number of falls in past year: 0 Was there an injury with Fall?: 0 Fall Risk Category Calculator: 0 Patient Fall Risk Level: Low Fall Risk  Fall Risk Patient at Risk for Falls Due to: No Fall Risks Fall risk Follow up: Falls evaluation completed  Home and Transportation Safety: All rugs have non-skid backing?: N/A, no rugs All stairs or steps have railings?: (!) no Grab bars in the bathtub or shower?: yes Have non-skid surface in bathtub or shower?: yes Good home lighting?: yes Regular seat belt use?: (!) no Hospital stays in the last year:: no  Cognitive Assessment Difficulty concentrating, remembering, or making decisions? : no Will 6CIT or Mini Cog be Completed: yes What year is it?: 0 points What month is it?: 0 points Give patient an address phrase to remember (5 components): [address] [place] ,[place] About what time is it?: 0 points Count backwards from 20 to 1: 0 points Say the months of the year in reverse: 0 points Repeat the address phrase from earlier: 0 points 6 CIT Score: 0 points  Advance Directives (For Healthcare) Does Patient Have a Medical Advance Directive?: No Would patient like information on creating a medical advance directive?:  No - Patient declined  Reviewed/Updated   Reviewed/Updated: Reviewed All (Medical, Surgical, Family, Medications, Allergies, Care Teams, Patient Goals)    Allergies (verified) Sulfamethoxazole   Current Medications (verified) Outpatient Encounter Medications as of 12/23/2023  Medication Sig   acetaminophen  (TYLENOL ) 325 MG tablet Take 2 tablets (650 mg total) by mouth every 6 (six) hours as needed for moderate pain (pain score 4-6).   ALPRAZolam  (XANAX ) 1 MG tablet TAKE 1 TABLET BY MOUTH AT BEDTIME AS NEEDED FOR ANXIETY   amLODipine -benazepril  (LOTREL) 10-20 MG capsule Take 1 capsule by mouth daily.   atorvastatin  (LIPITOR) 40 MG tablet Take 1 tablet by mouth once daily   Blood Glucose Monitoring Suppl (ONE TOUCH ULTRA 2) w/Device KIT USE TO CHECK GLUCOSE THREE TIMES DAILY   diclofenac  Sodium (VOLTAREN  ARTHRITIS PAIN) 1 % GEL Apply 4 g topically 4 (four) times daily.   FARXIGA  10 MG TABS tablet Take 1 tablet by mouth once daily   glucose blood test strip 1 each by Other route daily. Use as instructed   Insulin  Pen Needle (BD PEN NEEDLE MICRO U/F) 32G X 6 MM MISC USE AS DIRECTED ONCE DAILY   metFORMIN  (GLUCOPHAGE ) 1000 MG tablet Take 1 tablet by mouth twice daily   SHINGRIX injection Inject 0.5 mLs into the muscle once.   tadalafil  (CIALIS ) 5 MG tablet Take 1 tablet (5 mg total) by mouth daily.   tamsulosin  (FLOMAX ) 0.4 MG CAPS capsule Take 1 capsule (0.4 mg total) by mouth daily.   traMADol  (ULTRAM -ER) 200 MG 24 hr tablet Take 1 tablet by mouth once daily   TRESIBA  FLEXTOUCH 100 UNIT/ML FlexTouch Pen INJECT 30 UNITS SUBCUTANEOUSLY ONCE DAILY   No facility-administered encounter medications on file as of 12/23/2023.    History: Past Medical History:  Diagnosis Date   Arthritis    Benign essential hypertension 04/27/2019   Chronic kidney disease, stage 3a (HCC) 04/27/2019   Family history of adverse reaction to anesthesia    Mothers sister had issues, but pt isn't sure what they were   Mixed hyperlipidemia 04/27/2019    Type 2 diabetes mellitus with diabetic polyneuropathy (HCC) 04/27/2019   Type 2 diabetes mellitus with stage 3 chronic kidney disease (HCC) 04/27/2019   Past Surgical History:  Procedure Laterality Date   COLONOSCOPY     ELBOW SURGERY Left 1994   KNEE ARTHROSCOPY Right 1998   KNEE CARTILAGE SURGERY Left 1994   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   Procedure: RIGHT TOTAL KNEE ARTHROPLASTY;  Surgeon: Jerri Kay HERO, MD;  Location: [facility] OR;  Service: Orthopedics;  Laterality: Right;   TOTAL KNEE ARTHROPLASTY Left 04/06/2022   Procedure: LEFT TOTAL KNEE ARTHROPLASTY;  Surgeon: Jerri Kay HERO, MD;  Location: [facility] OR;  Service: Orthopedics;  Laterality: Left;   Family History  Problem Relation Age of Onset   Diabetes Mother    Kidney disease Mother    Heart disease Father    Social History   Occupational History   Not on file  Tobacco Use   Smoking status: Former    Current packs/day: 0.00    Average packs/day: 0.5 packs/day for 8.0 years (4.0 ttl pk-yrs)    Types: Cigarettes    Start date: 23    Quit date: 2000    Years since quitting: 25.9   Smokeless tobacco: Current    Types: Chew   Tobacco comments:    1 can per week  Vaping Use   Vaping status: Never Used  Substance and Sexual Activity  Alcohol use: Not Currently    Alcohol/week: 2.0 - 3.0 standard drinks of alcohol    Types: 1 Glasses of wine, 1 - 2 Standard drinks or equivalent per week    Comment: sometimes   Drug use: Never   Sexual activity: Yes    Partners: Female   Tobacco Counseling Ready to quit: Not Answered Counseling given: Not Answered Tobacco comments: 1 can per week  SDOH Screenings   Food Insecurity: No Food Insecurity (12/23/2023)  Housing: Low Risk  (12/23/2023)  Transportation Needs: No Transportation Needs (12/23/2023)  Utilities: Not At Risk (12/23/2023)  Alcohol Screen: Low Risk  (12/22/2022)  Depression (PHQ2-9): Low Risk  (12/23/2023)  Financial Resource Strain: Low Risk  (12/22/2022)   Physical Activity: Insufficiently Active (12/23/2023)  Social Connections: Moderately Isolated (12/23/2023)  Stress: No Stress Concern Present (12/23/2023)  Tobacco Use: High Risk (12/23/2023)  Health Literacy: Adequate Health Literacy (12/23/2023)   See flowsheets for full screening details  Depression Screen PHQ 2 & 9 Depression Scale- Over the past 2 weeks, how often have you been bothered by any of the following problems? Little interest or pleasure in doing things: 0 Feeling down, depressed, or hopeless (PHQ Adolescent also includes...irritable): 0 PHQ-2 Total Score: 0 Trouble falling or staying asleep, or sleeping too much: 0 Feeling tired or having little energy: 0 Poor appetite or overeating (PHQ Adolescent also includes...weight loss): 0 Feeling bad about yourself - or that you are a failure or have let yourself or your family down: 0 Trouble concentrating on things, such as reading the newspaper or watching television (PHQ Adolescent also includes...like school work): 0 Moving or speaking so slowly that other people could have noticed. Or the opposite - being so fidgety or restless that you have been moving around a lot more than usual: 0 Thoughts that you would be better off dead, or of hurting yourself in some way: 0 PHQ-9 Total Score: 0 If you checked off any problems, how difficult have these problems made it for you to do your work, take care of things at home, or get along with other people?: Not difficult at all  Depression Treatment Depression Interventions/Treatment : EYV7-0 Score <4 Follow-up Not Indicated     Goals Addressed             This Visit's Progress    Patient Stated       To join the gym             Objective:    Today's Vitals   12/23/23 0934  BP: 124/62  Weight: 185 lb (83.9 kg)  Height: 5' 10 (1.778 m)   Body mass index is 26.54 kg/m.  Hearing/Vision screen Hearing Screening - Comments:: Patient wears hearing aids  Vision  Screening - Comments:: Patient wears readers. Patient goes to [facility] and will schedule a appointment soon. Immunizations and Health Maintenance Health Maintenance  Topic Date Due   OPHTHALMOLOGY EXAM  07/04/2021   Colonoscopy  10/25/2023   Diabetic kidney evaluation - Urine ACR  04/27/2024   FOOT EXAM  04/27/2024   HEMOGLOBIN A1C  05/02/2024   Diabetic kidney evaluation - eGFR measurement  11/01/2024   Medicare Annual Wellness (AWV)  12/22/2024   DTaP/Tdap/Td (2 - Td or Tdap) 05/30/2032   Pneumococcal Vaccine: 50+ Years  Completed   Influenza Vaccine  Completed   Hepatitis C Screening  Completed   Zoster Vaccines- Shingrix  Completed   Meningococcal B Vaccine  Aged Out   COVID-19 Vaccine  Discontinued   Fecal DNA (Cologuard)  Discontinued        Assessment/Plan:  This is a routine wellness examination for Clayton.  Patient Care Team: Milon Cleaves, [place] as PCP - General (Physician Assistant) Nyle Rankin POUR, [facility] (Inactive) (Pharmacist) Roseann, Adine PARAS., MD as Referring Physician (Urology) Ruthell Lauraine FALCON, NP as Nurse Practitioner (Pulmonary Disease) Associates, [facility]  I have personally reviewed and noted the following in the patient's chart:   Medical and social history Use of alcohol, tobacco or illicit drugs  Current medications and supplements including opioid prescriptions. Functional ability and status Nutritional status Physical activity Advanced directives List of other physicians Hospitalizations, surgeries, and ER visits in previous 12 months Vitals Screenings to include cognitive, depression, and falls Referrals and appointments  No orders of the defined types were placed in this encounter.  In addition, I have reviewed and discussed with patient certain preventive protocols, quality metrics, and best practice recommendations. A written personalized care plan for preventive services as well as general preventive health  recommendations were provided to patient.   Lyle POUR Right, CMA   12/23/2023   Return in 1 year (on 12/22/2024).  After Visit Summary: (MyChart) Due to this being a telephonic visit, the after visit summary with patients personalized plan was offered to patient via MyChart   Nurse Notes: Patient has no questions or concerns at this time . Patient states he has a colonoscopy schedule but he does not know the date because he was not home . Also states he will call [facility] to schedule eye exam.

## 2023-12-27 NOTE — Telephone Encounter (Signed)
 Called and informed patient that we have received the fax from [employer] [facility] and we have filled it out and faxing it back.

## 2023-12-27 NOTE — Telephone Encounter (Signed)
 Pt called requesting follow up on this request, he wants a call back from the clinic

## 2023-12-29 ENCOUNTER — Other Ambulatory Visit: Payer: Self-pay | Admitting: Physician Assistant

## 2023-12-29 DIAGNOSIS — F419 Anxiety disorder, unspecified: Secondary | ICD-10-CM

## 2023-12-30 ENCOUNTER — Telehealth: Payer: Self-pay | Admitting: Physician Assistant

## 2023-12-30 NOTE — Telephone Encounter (Signed)
 [facility] Order Form

## 2024-01-04 ENCOUNTER — Telehealth: Payer: Self-pay

## 2024-01-04 NOTE — Telephone Encounter (Signed)
 Copied from CRM #[id number]. Topic: Clinical - Medication Question >> Jan 04, 2024  1:11 PM Clayton James wrote: Reason for CRM:  freestyle libre 3 plus sensors- patient is still waiting for [employer] [facility] to be able to supply his diabetic supplies, they are stating they are still trying to reach the doctor's office so they can get the order- he has not had supplies in over 2 months now- call with an update [phone]

## 2024-01-04 NOTE — Telephone Encounter (Signed)
 Called patient and informed him that all the paperwork has been faxed twice and the patient is still getting the same response from [employer] [facility] stating that they have not received nothing. The patient stated they were refaxing the paper work back again to filled out again and I told the patient we would be glad to fill it out again and send again.

## 2024-01-07 DIAGNOSIS — E1142 Type 2 diabetes mellitus with diabetic polyneuropathy: Secondary | ICD-10-CM | POA: Diagnosis not present

## 2024-01-09 ENCOUNTER — Other Ambulatory Visit: Payer: Self-pay | Admitting: Physician Assistant

## 2024-01-09 DIAGNOSIS — E782 Mixed hyperlipidemia: Secondary | ICD-10-CM

## 2024-01-18 ENCOUNTER — Ambulatory Visit: Admitting: Urology

## 2024-01-18 ENCOUNTER — Encounter: Payer: Self-pay | Admitting: Urology

## 2024-01-18 VITALS — BP 151/79 | HR 65 | Ht 70.0 in | Wt 185.0 lb

## 2024-01-18 DIAGNOSIS — N401 Enlarged prostate with lower urinary tract symptoms: Secondary | ICD-10-CM

## 2024-01-18 DIAGNOSIS — N200 Calculus of kidney: Secondary | ICD-10-CM

## 2024-01-18 DIAGNOSIS — Q631 Lobulated, fused and horseshoe kidney: Secondary | ICD-10-CM

## 2024-01-18 DIAGNOSIS — R351 Nocturia: Secondary | ICD-10-CM | POA: Diagnosis not present

## 2024-01-18 DIAGNOSIS — N138 Other obstructive and reflux uropathy: Secondary | ICD-10-CM | POA: Diagnosis not present

## 2024-01-18 DIAGNOSIS — N529 Male erectile dysfunction, unspecified: Secondary | ICD-10-CM | POA: Diagnosis not present

## 2024-01-18 LAB — URINALYSIS, ROUTINE W REFLEX MICROSCOPIC
Bilirubin, UA: NEGATIVE
Ketones, UA: NEGATIVE
Leukocytes,UA: NEGATIVE
Nitrite, UA: NEGATIVE
Protein,UA: NEGATIVE
RBC, UA: NEGATIVE
Specific Gravity, UA: 1.015 (ref 1.005–1.030)
Urobilinogen, Ur: 1 mg/dL (ref 0.2–1.0)
pH, UA: 6 (ref 5.0–7.5)

## 2024-01-18 LAB — MICROSCOPIC EXAMINATION: Bacteria, UA: NONE SEEN

## 2024-01-18 MED ORDER — SILDENAFIL CITRATE 50 MG PO TABS: 50.0000 mg | ORAL_TABLET | Freq: Every day | ORAL | 11 refills | Status: AC | PRN

## 2024-01-18 NOTE — Progress Notes (Signed)
 "  Assessment: 1. Nephrolithiasis   2. Horseshoe kidney   3. BPH with obstruction/lower urinary tract symptoms   4. Organic impotence     Plan: Continue stone prevention  Continue tamsulosin  0.4 mg daily for BPH with LUTS Continue tadalafil  5 mg daily.  Rx for sildenafil  50 mg prn provided with goodRx coupon. Return to office in 6 months.  Chief Complaint:  Chief Complaint  Patient presents with   Nephrolithiasis    History of Present Illness:  Clayton James is a 73 y.o. male who is seen for continued evaluation of nephrolithiasis and horseshoe kidney. He underwent evaluation with CT imaging for evaluation of left lower quadrant pain in 9/24.  The pain lasted approximately 4-5 days and resolved spontaneously.  No further symptoms.   CT from 10/07/2022 showed a horseshoe kidney with multiple right renal calculi with the largest stone measuring 6 mm in size, mild dilation of the right renal pelvis and mild dilation of the right renal calyces consistent with a chronic right UPJ obstruction, mild dilation of the left renal pelvis. No prior history of kidney stones.  He has a history of lower urinary tract symptoms including frequency, nocturia, intermittent stream, hesitancy.  No dysuria or gross hematuria.  He has been on tamsulosin  for management of his symptoms with improvement. IPSS = 13.  PSA 2/24: 2.2 PSA 4/25: 3.1  At his visit in April 2025, he had a recurrence of his left back and lower abdominal pain, severe at times.  He was taking tramadol  for the pain intermittently.  No fevers, chills, nausea, or vomiting.  No dysuria or gross hematuria.   CT scan from 04/30/2023 showed a horseshoe kidney with right sided nonobstructing nephrolithiasis. IPSS = 3.  At his visit in June 2025, he reported good results with the tadalafil  5 mg daily.  He was able to achieve a good erection and maintain it.  His lower urinary tract symptoms remained stable.  He continued on tamsulosin  0.4 mg  daily.  No dysuria or gross hematuria.  He had not had any recent kidney stone symptoms. IPSS = 3/1.  He returns today for scheduled follow-up.  He continues on tamsulosin  and tadalafil  daily.  His lower urinary tract symptoms are well-controlled.  His only symptom is nocturia x 1.  No dysuria or gross hematuria. IPSS = 1/1. No recent stone symptoms. He does not feel like his erections are as good with the daily tadalafil .  Portions of the above documentation were copied from a prior visit for review purposes only.   Past Medical History:  Past Medical History:  Diagnosis Date   Arthritis    Benign essential hypertension 04/27/2019   Chronic kidney disease, stage 3a (HCC) 04/27/2019   Family history of adverse reaction to anesthesia    Mothers sister had issues, but pt isn't sure what they were   Mixed hyperlipidemia 04/27/2019   Type 2 diabetes mellitus with diabetic polyneuropathy (HCC) 04/27/2019   Type 2 diabetes mellitus with stage 3 chronic kidney disease (HCC) 04/27/2019    Past Surgical History:  Past Surgical History:  Procedure Laterality Date   COLONOSCOPY     ELBOW SURGERY Left 1994   KNEE ARTHROSCOPY Right 1998   KNEE CARTILAGE SURGERY Left 1994   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   Procedure: RIGHT TOTAL KNEE ARTHROPLASTY;  Surgeon: Jerri Kay CHRISTELLA, MD;  Location: [facility] OR;  Service: Orthopedics;  Laterality: Right;   TOTAL KNEE ARTHROPLASTY Left 04/06/2022   Procedure: LEFT  TOTAL KNEE ARTHROPLASTY;  Surgeon: Jerri Kay HERO, MD;  Location: [facility] OR;  Service: Orthopedics;  Laterality: Left;    Allergies:  Allergies  Allergen Reactions   Sulfamethoxazole Rash    Family History:  Family History  Problem Relation Age of Onset   Diabetes Mother    Kidney disease Mother    Heart disease Father     Social History:  Social History   Tobacco Use   Smoking status: Former    Current packs/day: 0.00    Average packs/day: 0.5 packs/day for 8.0 years (4.0 ttl  pk-yrs)    Types: Cigarettes    Start date: 76    Quit date: 2000    Years since quitting: 26.0   Smokeless tobacco: Current    Types: Chew   Tobacco comments:    1 can per week  Vaping Use   Vaping status: Never Used  Substance Use Topics   Alcohol use: Not Currently    Alcohol/week: 2.0 - 3.0 standard drinks of alcohol    Types: 1 Glasses of wine, 1 - 2 Standard drinks or equivalent per week    Comment: sometimes   Drug use: Never    ROS: Constitutional:  Negative for fever, chills, weight loss CV: Negative for chest pain, previous MI, hypertension Respiratory:  Negative for shortness of breath, wheezing, sleep apnea, frequent cough GI:  Negative for nausea, vomiting, bloody stool, GERD  Physical exam: BP (!) 151/79   Pulse 65   Ht 5' 10 (1.778 m)   Wt 185 lb (83.9 kg)   BMI 26.54 kg/m  GENERAL APPEARANCE:  Well appearing, well developed, well nourished, NAD HEENT:  Atraumatic, normocephalic, oropharynx clear NECK:  Supple without lymphadenopathy or thyromegaly ABDOMEN:  Soft, non-tender, no masses EXTREMITIES:  Moves all extremities well, without clubbing, cyanosis, or edema NEUROLOGIC:  Alert and oriented x 3, normal gait, CN II-XII grossly intact MENTAL STATUS:  appropriate BACK:  Non-tender to palpation, No CVAT SKIN:  Warm, dry, and intact   Results: U/A: 0-5 WBCs, 0-2 RBCs "

## 2024-01-20 ENCOUNTER — Other Ambulatory Visit: Payer: Self-pay | Admitting: Family Medicine

## 2024-01-20 DIAGNOSIS — M545 Low back pain, unspecified: Secondary | ICD-10-CM

## 2024-01-20 DIAGNOSIS — M19011 Primary osteoarthritis, right shoulder: Secondary | ICD-10-CM

## 2024-01-24 DIAGNOSIS — M19011 Primary osteoarthritis, right shoulder: Secondary | ICD-10-CM

## 2024-01-24 DIAGNOSIS — M545 Low back pain, unspecified: Secondary | ICD-10-CM

## 2024-01-24 NOTE — Telephone Encounter (Signed)
 Copied from CRM #[id number]. Topic: Clinical - Medication Refill >> Jan 24, 2024  3:39 PM Everette C wrote: Medication: traMADol  (ULTRAM -ER) 200 MG 24 hr tablet [[id number]]  Has the patient contacted their pharmacy? No (Agent: If no, request that the patient contact the pharmacy for the refill. If patient does not wish to contact the pharmacy document the reason why and proceed with request.) (Agent: If yes, when and what did the pharmacy advise?)  This is the patient's preferred pharmacy:  [facility] [address], [place] - [address] [place] [address] Phone: [phone] Fax: [phone]  Is this the correct pharmacy for this prescription? Yes If no, delete pharmacy and type the correct one.   Has the prescription been filled recently? Yes  Is the patient out of the medication? Yes  Has the patient been seen for an appointment in the last year OR does the patient have an upcoming appointment? Yes  Can we respond through MyChart? No  Agent: Please be advised that Rx refills may take up to 3 business days. We ask that you follow-up with your pharmacy.

## 2024-01-25 DIAGNOSIS — E1142 Type 2 diabetes mellitus with diabetic polyneuropathy: Secondary | ICD-10-CM

## 2024-01-26 ENCOUNTER — Other Ambulatory Visit: Payer: Self-pay | Admitting: Family Medicine

## 2024-02-21 ENCOUNTER — Other Ambulatory Visit: Payer: Self-pay | Admitting: Physician Assistant

## 2024-02-21 DIAGNOSIS — M19011 Primary osteoarthritis, right shoulder: Secondary | ICD-10-CM

## 2024-02-21 DIAGNOSIS — M545 Low back pain, unspecified: Secondary | ICD-10-CM

## 2024-03-06 ENCOUNTER — Ambulatory Visit: Admitting: Physician Assistant

## 2024-03-15 ENCOUNTER — Ambulatory Visit: Admitting: Physician Assistant

## 2024-04-05 ENCOUNTER — Ambulatory Visit: Admitting: Physician Assistant

## 2024-07-19 ENCOUNTER — Ambulatory Visit: Admitting: Urology
